# Patient Record
Sex: Male | Born: 1960
Health system: Southern US, Community
[De-identification: ages and names within clinical notes are randomized; demographics above are authoritative.]

## PROBLEM LIST (undated history)

## (undated) DIAGNOSIS — I10 Essential (primary) hypertension: Secondary | ICD-10-CM

## (undated) DIAGNOSIS — M419 Scoliosis, unspecified: Secondary | ICD-10-CM

## (undated) DIAGNOSIS — E291 Testicular hypofunction: Secondary | ICD-10-CM

## (undated) DIAGNOSIS — F524 Premature ejaculation: Secondary | ICD-10-CM

## (undated) DIAGNOSIS — F419 Anxiety disorder, unspecified: Secondary | ICD-10-CM

## (undated) DIAGNOSIS — Z860100 Personal history of colon polyps, unspecified: Secondary | ICD-10-CM

## (undated) DIAGNOSIS — M545 Low back pain, unspecified: Secondary | ICD-10-CM

## (undated) DIAGNOSIS — N529 Male erectile dysfunction, unspecified: Secondary | ICD-10-CM

## (undated) DIAGNOSIS — G4730 Sleep apnea, unspecified: Secondary | ICD-10-CM

## (undated) DIAGNOSIS — Z8719 Personal history of other diseases of the digestive system: Secondary | ICD-10-CM

## (undated) DIAGNOSIS — Z8711 Personal history of peptic ulcer disease: Secondary | ICD-10-CM

## (undated) DIAGNOSIS — Z973 Presence of spectacles and contact lenses: Secondary | ICD-10-CM

## (undated) DIAGNOSIS — R339 Retention of urine, unspecified: Secondary | ICD-10-CM

## (undated) DIAGNOSIS — Z9189 Other specified personal risk factors, not elsewhere classified: Secondary | ICD-10-CM

## (undated) DIAGNOSIS — F32A Depression, unspecified: Secondary | ICD-10-CM

## (undated) DIAGNOSIS — C61 Malignant neoplasm of prostate: Secondary | ICD-10-CM

## (undated) DIAGNOSIS — F329 Major depressive disorder, single episode, unspecified: Secondary | ICD-10-CM

## (undated) DIAGNOSIS — G8929 Other chronic pain: Secondary | ICD-10-CM

## (undated) HISTORY — DX: Retention of urine, unspecified: R33.9

## (undated) HISTORY — DX: Premature ejaculation: F52.4

## (undated) HISTORY — DX: Malignant neoplasm of prostate: C61

## (undated) HISTORY — DX: Major depressive disorder, single episode, unspecified: F32.9

## (undated) HISTORY — DX: Depression, unspecified: F32.A

## (undated) HISTORY — PX: APPENDECTOMY: SHX54

## (undated) HISTORY — PX: PROSTATE BIOPSY: SHX241

---

## 2010-11-30 HISTORY — PX: LUMBAR FUSION: SHX111

## 2011-08-24 ENCOUNTER — Emergency Department (INDEPENDENT_AMBULATORY_CARE_PROVIDER_SITE_OTHER)
Admission: EM | Admit: 2011-08-24 | Discharge: 2011-08-24 | Disposition: A | Discharge status: Home / Self Care | Payer: Self-pay | Source: Home / Self Care | Attending: Family Medicine | Admitting: Family Medicine

## 2011-08-24 ENCOUNTER — Encounter: Payer: Self-pay | Admitting: Cardiology

## 2011-08-24 DIAGNOSIS — J069 Acute upper respiratory infection, unspecified: Secondary | ICD-10-CM

## 2011-08-24 HISTORY — DX: Low back pain: M54.5

## 2011-08-24 HISTORY — DX: Other chronic pain: G89.29

## 2011-08-24 HISTORY — DX: Essential (primary) hypertension: I10

## 2011-08-24 HISTORY — DX: Low back pain, unspecified: M54.50

## 2011-08-24 MED ORDER — IPRATROPIUM BROMIDE 0.06 % NA SOLN: 2.0000 | Freq: Four times a day (QID) | NASAL | Status: DC

## 2011-08-24 MED ORDER — HYDROCOD POLST-CHLORPHEN POLST 10-8 MG/5ML PO LQCR: 5.0000 mL | Freq: Two times a day (BID) | ORAL | Status: DC

## 2011-08-24 NOTE — ED Notes (Signed)
Pt reports cough for the past 5 days getting worse since Friday. Reports back pain when he coughs. Pt reports chills. Denies fever.

## 2011-08-24 NOTE — ED Provider Notes (Signed)
History     CSN: [id number]  Arrival date & time 08/24/11  1118   First MD Initiated Contact with Patient 08/24/11 1206      Chief Complaint  Patient presents with  . Cough  . Back Pain    (Consider location/radiation/quality/duration/timing/severity/associated sxs/prior treatment) Patient is a 50 y.o. male presenting with URI. The history is provided by the patient.  URI The primary symptoms include cough. Primary symptoms do not include fever, nausea or vomiting. The current episode started 3 to 5 days ago. This is a new problem. The problem has not changed since onset. The onset of the illness is associated with exposure to sick contacts. Symptoms associated with the illness include congestion and rhinorrhea.    No past medical history on file.  No past surgical history on file.  No family history on file.  History  Substance Use Topics  . Smoking status: Not on file  . Smokeless tobacco: Not on file  . Alcohol Use: Not on file      Review of Systems  Constitutional: Negative for fever.  HENT: Positive for congestion and rhinorrhea.   Respiratory: Positive for cough.   Cardiovascular: Negative.   Gastrointestinal: Negative.  Negative for nausea and vomiting.  Genitourinary: Negative.   Musculoskeletal: Positive for back pain.    Allergies  Review of patient's allergies indicates not on file.  Home Medications   Current Outpatient Rx  Name Route Sig Dispense Refill  . HYDROCOD POLST-CHLORPHEN POLST 10-8 MG/5ML PO LQCR Oral Take 5 mLs by mouth every 12 (twelve) hours. 115 mL 0  . IPRATROPIUM BROMIDE 0.06 % NA SOLN Nasal Place 2 sprays into the nose 4 (four) times daily. 15 mL 12    BP 126/85  Pulse 102  Temp(Src) 99.4 F (37.4 C) (Oral)  Resp 16  SpO2 98%  Physical Exam  Nursing note and vitals reviewed. Constitutional: He appears well-developed and well-nourished.  HENT:  Head: Normocephalic.  Right Ear: External ear normal.  Left Ear:  External ear normal.  Mouth/Throat: Oropharynx is clear and moist.  Eyes: Pupils are equal, round, and reactive to light.  Neck: Normal range of motion. Neck supple.  Cardiovascular: Normal rate, regular rhythm, normal heart sounds and intact distal pulses.   Pulmonary/Chest: Effort normal and breath sounds normal.  Abdominal: Soft. Bowel sounds are normal.  Lymphadenopathy:    He has no cervical adenopathy.  Skin: Skin is warm and dry.    ED Course  Procedures (including critical care time)  Labs Reviewed - No data to display No results found.   1. URI (upper respiratory infection)       MDM          Barkley Bruns, MD 08/24/11 1339

## 2012-07-20 ENCOUNTER — Emergency Department (HOSPITAL_COMMUNITY)
Admission: EM | Admit: 2012-07-20 | Discharge: 2012-07-20 | Disposition: A | Discharge status: Home / Self Care | Payer: Self-pay | Attending: Emergency Medicine | Admitting: Emergency Medicine

## 2012-07-20 ENCOUNTER — Encounter (HOSPITAL_COMMUNITY): Payer: Self-pay | Admitting: *Deleted

## 2012-07-20 DIAGNOSIS — M545 Low back pain, unspecified: Secondary | ICD-10-CM | POA: Insufficient documentation

## 2012-07-20 DIAGNOSIS — G8929 Other chronic pain: Secondary | ICD-10-CM | POA: Insufficient documentation

## 2012-07-20 DIAGNOSIS — Z79899 Other long term (current) drug therapy: Secondary | ICD-10-CM | POA: Insufficient documentation

## 2012-07-20 DIAGNOSIS — I10 Essential (primary) hypertension: Secondary | ICD-10-CM | POA: Insufficient documentation

## 2012-07-20 DIAGNOSIS — F17200 Nicotine dependence, unspecified, uncomplicated: Secondary | ICD-10-CM | POA: Insufficient documentation

## 2012-07-20 LAB — POCT I-STAT, CHEM 8
Calcium, Ion: 1.18 mmol/L (ref 1.12–1.23)
Chloride: 103 mEq/L (ref 96–112)
HCT: 47 % (ref 39.0–52.0)
Potassium: 4.3 mEq/L (ref 3.5–5.1)
Sodium: 136 mEq/L (ref 135–145)

## 2012-07-20 LAB — POCT I-STAT TROPONIN I: Troponin i, poc: 0 ng/mL (ref 0.00–0.08)

## 2012-07-20 MED ORDER — ENALAPRIL-HYDROCHLOROTHIAZIDE 10-25 MG PO TABS: 1.0000 | ORAL_TABLET | Freq: Every day | ORAL | Status: DC

## 2012-07-20 NOTE — ED Provider Notes (Signed)
History     CSN: [id number]  Arrival date & time 07/20/12  4098   First MD Initiated Contact with Patient 07/20/12 [phone]      Chief Complaint  Patient presents with  . Hypertension    (Consider location/radiation/quality/duration/timing/severity/associated sxs/prior treatment) HPI Justin Dunn is a 51 y.o. male who presents with complaint of an elevated BP. Pt states he was at school with his son talking to a school nurse, when he was not feeling well. States he asked her measure his BP and it ws 200/100. Pt states at that time, he just felt tired. No specific complaints. Denies chest pain, SOB, swelling, headache. States has not taken his BP medications for about a week. States he misplaced it. States he is under a lot of stress, taking are of special needs son, and just separated with wife a month ago. Denies any complaints at present.   Past Medical History  Diagnosis Date  . Hypertension   . Chronic low back pain     Past Surgical History  Procedure Date  . Back surgery 12/16/2010    lower back surgery    History reviewed. No pertinent family history.  History  Substance Use Topics  . Smoking status: Current Some Day Smoker  . Smokeless tobacco: Not on file  . Alcohol Use: Yes     Comment: occas.      Review of Systems  Constitutional: Negative for fever, chills and unexpected weight change.  HENT: Negative for facial swelling, neck pain and neck stiffness.   Eyes: Negative for visual disturbance.  Respiratory: Negative.   Cardiovascular: Negative.   Gastrointestinal: Negative.   Musculoskeletal: Negative for myalgias and arthralgias.  Skin: Negative.   Neurological: Negative for dizziness, weakness, light-headedness and headaches.    Allergies  Review of patient's allergies indicates no known allergies.  Home Medications   Current Outpatient Rx  Name  Route  Sig  Dispense  Refill  . GABAPENTIN 600 MG PO TABS   Oral   Take 600 mg by mouth 3  (three) times daily.         . IBUPROFEN 800 MG PO TABS   Oral   Take 800 mg by mouth 3 (three) times daily with meals.         . TRAMADOL HCL 50 MG PO TABS   Oral   Take 50 mg by mouth every 4 (four) hours as needed. For pain         . ZOLPIDEM TARTRATE 10 MG PO TABS   Oral   Take 10 mg by mouth at bedtime.            BP 158/92  Pulse 78  Temp 98.3 F (36.8 C) (Oral)  Resp 18  SpO2 100%  Physical Exam  Nursing note and vitals reviewed. Constitutional: He is oriented to person, place, and time.  HENT:  Head: Normocephalic.  Eyes: Conjunctivae normal are normal.  Neck: Neck supple.  Pulmonary/Chest: Effort normal and breath sounds normal. No respiratory distress. He has no wheezes. He has no rales.  Abdominal: Soft. Bowel sounds are normal. He exhibits no distension. There is no tenderness. There is no rebound.  Musculoskeletal: Normal range of motion. He exhibits no edema.  Neurological: He is alert and oriented to person, place, and time.  Skin: Skin is warm and dry.  Psychiatric: He has a normal mood and affect.    ED Course  Procedures (including critical care time)   Date: 07/20/2012  Rate:  82  Rhythm: normal sinus rhythm  QRS Axis: normal  Intervals: normal  ST/T Wave abnormalities: normal  Conduction Disutrbances:none  Narrative Interpretation: early precordial RS transition  Old EKG Reviewed: none available  Results for orders placed during the hospital encounter of 07/20/12  POCT I-STAT TROPONIN I      Component Value Range   Troponin i, poc 0.00  0.00 - 0.08 ng/mL   Comment 3           POCT I-STAT, CHEM 8      Component Value Range   Sodium 136  135 - 145 mEq/L   Potassium 4.3  3.5 - 5.1 mEq/L   Chloride 103  96 - 112 mEq/L   BUN 16  6 - 23 mg/dL   Creatinine, Ser 9.62  0.50 - 1.35 mg/dL   Glucose, Bld 92  70 - 99 mg/dL   Calcium, Ion 9.52  8.41 - 1.23 mmol/L   TCO2 24  0 - 100 mmol/L   Hemoglobin 16.0  13.0 - 17.0 g/dL   HCT 32.4   40.1 - 02.7 %    Negative chem 8, negative troponin. Pt's BP has bee elevated here, but no signs of hypertensive urgency/emergency, no end organ damage. Pt requesting bp medication change, he states old meds reduce his urinary flow. Will try ACE/HCTZ combination.    1. Hypertension       MDM  Pt with recent stress, off of his BP medications for a week. VS normal. Will restart meds. Chem 8 normal here. BP elevated, no signs of end organ damage. Pt stable for d/c home with close follow up with pcp.   Filed Vitals:   07/20/12 0922 07/20/12 0931 07/20/12 1000 07/20/12 1200  BP: 183/121 158/92 151/92 157/106  Pulse: 82 78    Temp: 98.3 F (36.8 C)     TempSrc: Oral     Resp: 20 18 15    SpO2: 97% 100%            Lottie Mussel, PA 07/20/12 1634

## 2012-07-20 NOTE — ED Notes (Signed)
Patient denies pain and is resting comfortably.  

## 2012-07-20 NOTE — ED Notes (Addendum)
Patient complains of HTN yesterday despite BP medications.  Takes Lisinopril and clonidine for same.   Out of BP medications for 1 month.  Patient had recent back surgery.

## 2012-07-20 NOTE — ED Notes (Signed)
D/c home in NAD. Discussed importance of taking BP medications daily and need for follow up with PCP. Pt voiced understanding of d/c instructions and follow up care.

## 2012-07-23 NOTE — ED Provider Notes (Signed)
Medical screening examination/treatment/procedure(s) were performed by non-physician practitioner and as supervising physician I was immediately available for consultation/collaboration.  Derwood Kaplan, MD 07/23/12 0222

## 2014-01-09 ENCOUNTER — Encounter: Payer: Self-pay | Admitting: Radiation Oncology

## 2014-01-09 DIAGNOSIS — C61 Malignant neoplasm of prostate: Secondary | ICD-10-CM | POA: Insufficient documentation

## 2014-01-09 NOTE — Progress Notes (Signed)
Radiation Oncology         [phone]) [phone] ________________________________  Initial outpatient Consultation  Name: Justin Dunn. MRN: [id number]  Date: 01/11/2014  DOB: [birth year]  CW:[id number], Cora Collum, NP  [name], MD   REFERRING PHYSICIAN: [name], MD  DIAGNOSIS: 53 y.o. gentleman with stage T1c adenocarcinoma of the prostate with a Gleason's score of 3+3 and a PSA of 5.98  HISTORY OF PRESENT ILLNESS::Justin Dunn. is a 53 y.o. gentleman.  He was noted to have an elevated PSA of 5.62 in August 2014 by his primary care physician, Dr. Joseph Art.  Short interval follow-up PSA on 9/14 remained elevated at 5.98.  Accordingly, he was referred for evaluation in urology by Dr. Karsten Ro on 10/8,  digital rectal examination was performed at that time revealing 1+ gland with no nodules.  The patient proceeded to transrectal ultrasound with 12 biopsies of the prostate on 10/30.  The prostate volume measured 19.1 cc.  Out of 12 core biopsies,4 were positive.  The maximum Gleason score was 3+3, and this was seen on the left side as displayed belowL.  The patient reviewed the biopsy results with his urologist and he has kindly been referred today for discussion of potential radiation treatment options.  PREVIOUS RADIATION THERAPY: No  PAST MEDICAL HISTORY:  has a past medical history of Hypertension; Chronic low back pain; Anxiety; Hypogonadism male; Sleep apnea; Prostate cancer; Depression; and Premature ejaculation.    PAST SURGICAL HISTORY: Past Surgical History  Procedure Laterality Date  . Back surgery  12/16/2010    lower back surgery  . Prostate biopsy    . Low back surgery      FAMILY HISTORY: family history includes Cancer in his father.  SOCIAL HISTORY:  reports that he has been smoking Cigarettes.  He has been smoking about 0.25 packs per day. He does not have any smokeless tobacco history on file. He reports that he drinks alcohol. He reports that he does not  use illicit drugs.  ALLERGIES: Review of patient's allergies indicates no known allergies.  MEDICATIONS:  Current Outpatient Prescriptions  Medication Sig Dispense Refill  . cyclobenzaprine (FLEXERIL) 10 MG tablet Take 10 mg by mouth 3 (three) times daily as needed for muscle spasms.      . enalapril-hydrochlorothiazide (VASERETIC) 10-25 MG per tablet Take 1 tablet by mouth daily.  30 tablet  0  . gabapentin (NEURONTIN) 600 MG tablet Take 600 mg by mouth 3 (three) times daily.      Marland Kitchen ibuprofen (ADVIL,MOTRIN) 800 MG tablet Take 800 mg by mouth 3 (three) times daily with meals.      . sertraline (ZOLOFT) 50 MG tablet Take 50 mg by mouth daily.      . tadalafil (CIALIS) 20 MG tablet Take 20 mg by mouth daily as needed for erectile dysfunction.      . traZODone (DESYREL) 100 MG tablet Take 100 mg by mouth at bedtime.       No current facility-administered medications for this encounter.    REVIEW OF SYSTEMS:  A 15 point review of systems is documented in the electronic medical record. This was obtained by the nursing staff. However, I reviewed this with the patient to discuss relevant findings and make appropriate changes.  A comprehensive review of systems was negative..  The patient completed an IPSS and IIEF questionnaire.  His IPSS score was 14 indicating moderate urinary outflow obstructive symptoms.  He indicated that his erectile function is able  to complete sexual activity on most attempts.   PHYSICAL EXAM: This patient is in no acute distress.  He is alert and oriented.   height is 5\' 11"  (1.803 m) and weight is 211 lb 3.2 oz (95.8 kg).  He exhibits no respiratory distress or labored breathing.  He appears neurologically intact.  His mood is pleasant.  His affect is appropriate.  Please note the digital rectal exam findings described above.  KPS = 100  100 - Normal; no complaints; no evidence of disease. 90   - Able to carry on normal activity; minor signs or symptoms of disease. 80    - Normal activity with effort; some signs or symptoms of disease. 30   - Cares for self; unable to carry on normal activity or to do active work. 60   - Requires occasional assistance, but is able to care for most of his personal needs. 50   - Requires considerable assistance and frequent medical care. 62   - Disabled; requires special care and assistance. 51   - Severely disabled; hospital admission is indicated although death not imminent. 28   - Very sick; hospital admission necessary; active supportive treatment necessary. 10   - Moribund; fatal processes progressing rapidly. 0     - Dead  Karnofsky DA, Abelmann [place], Craver LS and Burchenal [facility] [phone]) The use of the nitrogen mustards in the palliative treatment of carcinoma: with particular reference to bronchogenic carcinoma Cancer 1 634-56   LABORATORY DATA:  Lab Results  Component Value Date   HGB 16.0 07/20/2012   HCT 47.0 07/20/2012   Lab Results  Component Value Date   NA 136 07/20/2012   K 4.3 07/20/2012   CL 103 07/20/2012   No results found for this basename: ALT,  AST,  GGT,  ALKPHOS,  BILITOT     RADIOGRAPHY: No results found.    IMPRESSION: This gentleman is a 53 y.o. gentleman with stage T1c adenocarcinoma of the prostate with a Gleason's score of 3+3 and a PSA of 5.98.  His T-Stage, Gleason's Score, and PSA put him into the favorable risk group.  Accordingly he is eligible for a variety of potential treatment options including active surveillance, prostatectomy, or radiotherapy with IMRT or seed implant.  PLAN:Today I reviewed the findings and workup thus far.  We discussed the natural history of prostate cancer.  We reviewed the the implications of T-stage, Gleason's Score, and PSA on decision-making and outcomes in prostate cancer.  We discussed radiation treatment in the management of prostate cancer with regard to the logistics and delivery of external beam radiation treatment as well as the logistics and delivery  of prostate brachytherapy.  We compared and contrasted each of these approaches and also compared these against prostatectomy.  The patient expressed interest in prostate brachytherapy.  I filled out a patient counseling form for him with relevant treatment diagrams and we retained a copy for our records.   The patient remains most interested in prostate brachytherapy.  I will share my findings with Dr. Karsten Ro.  The patient has our contact information once he makes his final decision.  If he elects to pursue seed implant, he will call Enid Derry to move forward with scheduling the procedure in the near future.     I enjoyed meeting with him today, and will look forward to participating in the care of this very nice gentleman.   I spent 60 minutes face to face with the patient and more than 50% of  that time was spent in counseling and/or coordination of care.   ------------------------------------------------  Sheral Apley. Tammi Klippel, M.D.

## 2014-01-10 ENCOUNTER — Encounter: Payer: Self-pay | Admitting: Radiation Oncology

## 2014-01-10 NOTE — Progress Notes (Signed)
GU Location of Tumor / Histology: adenocarcinoma of prostate  If Prostate Cancer, Gleason Score is (3 + 3) and PSA is (8.98)  Patient presented August 2014 with an elevated PSA of 5.62.  Biopsies of prostate (if applicable) revealed:     Past/Anticipated interventions by urology, if any: referral to radiation oncology  Past/Anticipated interventions by medical oncology, if any: None  Weight changes, if any: None noted  Bowel/Bladder complaints, if any: feelings of urinary urgency, difficulty starting the urine stream, weak urine stream, urinary stream starts and stops, erectile dysfunction and has to strain to initiate the flow of urine   Nausea/Vomiting, if any: None noted  Pain issues, if any:  None noted  SAFETY ISSUES:  Prior radiation? NO  Pacemaker/ICD?   Possible current pregnancy? N/A  Is the patient on methotrexate? 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  August 2014 PSA 5.62 September 2014 PSA 5.98 April 2015 PSA 8.98

## 2014-01-11 ENCOUNTER — Ambulatory Visit
Admission: RE | Admit: 2014-01-11 | Discharge: 2014-01-11 | Disposition: A | Discharge status: Home / Self Care | Payer: Medicare HMO | Source: Ambulatory Visit | Attending: Radiation Oncology | Admitting: Radiation Oncology

## 2014-01-11 ENCOUNTER — Encounter: Payer: Self-pay | Admitting: Radiation Oncology

## 2014-01-11 VITALS — Ht 71.0 in | Wt 211.2 lb

## 2014-01-11 DIAGNOSIS — C61 Malignant neoplasm of prostate: Secondary | ICD-10-CM

## 2014-01-11 HISTORY — DX: Testicular hypofunction: E29.1

## 2014-01-11 HISTORY — DX: Sleep apnea, unspecified: G47.30

## 2014-01-11 HISTORY — DX: Anxiety disorder, unspecified: F41.9

## 2014-01-11 NOTE — Progress Notes (Signed)
Patient presents today for consultation with Dr. Tammi Klippel reference prostate ca referral by Dr. Karsten Ro. Patient most interested in seed implant. IPSS 14 noted. Reports nocturia x 2. Reports urinary urgency. Reports difficulty starting urine stream, a weak urine stream, and intermittent stream (starts and stops). Patient reports erectile dysfunction and that he has to strain at time to initiate the flow of urine. Reports night sweats. Reports a weight loss of 15 lb x 1 month.  Denies bone pain. Denies headache, dizziness, nausea, vomiting or diarrhea. Denies dysuria or hematuria. Reports he stopped taking his testosterone injection November of 2014. Denies hx of radiation therapy. Denies having a pacemaker.

## 2014-01-11 NOTE — Progress Notes (Signed)
See progress note under physician encounter. 

## 2014-01-12 ENCOUNTER — Encounter: Payer: Self-pay | Admitting: *Deleted

## 2014-01-12 NOTE — Progress Notes (Signed)
[facility] Psychosocial Distress Screening Clinical Social Work  Clinical Social Work was referred by distress screening protocol.  The patient scored a 9 on the Psychosocial Distress Thermometer which indicates severe distress. Clinical Social Worker phoned pt to assess for distress and other psychosocial needs. Pt was not available, CSW left vm.   ONCBCN DISTRESS SCREENING 01/11/2014  Screening Type Initial Screening  Mark the number that describes how much distress you have been experiencing in the past week 9  Emotional problem type Depression;Adjusting to illness  Spiritual/Religous concerns type Facing my mortality  Information Concerns Type Lack of info about complementary therapy choices  Physical Problem type Pain  Physician notified of physical symptoms Yes  Referral to clinical psychology No  Referral to clinical social work Yes  Referral to dietition No  Referral to financial advocate No  Referral to support programs No  Referral to palliative care No    Clinical Social Worker follow up needed: yes  If yes, follow up plan: CSW awaits pt to return call and will attempt to follow up at later date.   Loren Racer, LCSW Clinical Social Worker [facility] [place] Wednesday, Thursday and Friday Phone: [phone] Fax: [phone]

## 2014-01-30 ENCOUNTER — Other Ambulatory Visit: Payer: Self-pay | Admitting: Urology

## 2014-01-31 ENCOUNTER — Ambulatory Visit
Admission: RE | Admit: 2014-01-31 | Discharge: 2014-01-31 | Disposition: A | Discharge status: Home / Self Care | Payer: Medicare HMO | Source: Ambulatory Visit | Attending: Radiation Oncology | Admitting: Radiation Oncology

## 2014-01-31 ENCOUNTER — Ambulatory Visit (HOSPITAL_COMMUNITY)
Admission: RE | Admit: 2014-01-31 | Discharge: 2014-01-31 | Disposition: A | Discharge status: Home / Self Care | Payer: Medicare HMO | Source: Ambulatory Visit | Attending: Urology | Admitting: Urology

## 2014-01-31 DIAGNOSIS — C61 Malignant neoplasm of prostate: Secondary | ICD-10-CM

## 2014-01-31 DIAGNOSIS — Z87891 Personal history of nicotine dependence: Secondary | ICD-10-CM | POA: Insufficient documentation

## 2014-01-31 DIAGNOSIS — I10 Essential (primary) hypertension: Secondary | ICD-10-CM | POA: Insufficient documentation

## 2014-01-31 NOTE — Progress Notes (Signed)
  Radiation Oncology         [phone]) [phone] ________________________________  Name: Maurie Boettcher. MRN: [id number]  Date: 01/31/2014  DOB: 1960-12-06  SIMULATION AND TREATMENT PLANNING NOTE PUBIC ARCH STUDY  UK:GURKYHC, Cora Collum, NP  [name], MD  DIAGNOSIS: 53 y.o. gentleman with stage T1c adenocarcinoma of the prostate with a Gleason's score of 3+3 and a PSA of 5.98  COMPLEX SIMULATION:  The patient presented today for evaluation for possible prostate seed implant. He was brought to the radiation planning suite and placed supine on the CT couch. A 3-dimensional image study set was obtained in upload to the planning computer. There, on each axial slice, I contoured the prostate gland. Then, using three-dimensional radiation planning tools I reconstructed the prostate in view of the structures from the transperineal needle pathway to assess for possible pubic arch interference. In doing so, I did not appreciate any pubic arch interference. Also, the patient's prostate volume was estimated based on the drawn structure. The volume was 26 cc.  Given the pubic arch appearance and prostate volume, patient remains a good candidate to proceed with prostate seed implant. Today, he freely provided informed written consent to proceed.    PLAN: The patient will undergo prostate seed implant.   ________________________________  Sheral Apley. Tammi Klippel, M.D.

## 2014-02-09 ENCOUNTER — Ambulatory Visit (HOSPITAL_BASED_OUTPATIENT_CLINIC_OR_DEPARTMENT_OTHER)
Admission: RE | Admit: 2014-02-09 | Discharge: 2014-02-09 | Disposition: A | Discharge status: Home / Self Care | Payer: Medicare HMO | Source: Ambulatory Visit | Attending: Urology | Admitting: Urology

## 2014-02-09 DIAGNOSIS — C61 Malignant neoplasm of prostate: Secondary | ICD-10-CM | POA: Insufficient documentation

## 2014-02-09 DIAGNOSIS — Z01810 Encounter for preprocedural cardiovascular examination: Secondary | ICD-10-CM | POA: Insufficient documentation

## 2014-02-28 HISTORY — PX: COLONOSCOPY W/ POLYPECTOMY: SHX1380

## 2014-03-01 ENCOUNTER — Other Ambulatory Visit (HOSPITAL_COMMUNITY): Payer: Medicare HMO

## 2014-03-30 DIAGNOSIS — I10 Essential (primary) hypertension: Secondary | ICD-10-CM | POA: Diagnosis not present

## 2014-03-30 DIAGNOSIS — Z8042 Family history of malignant neoplasm of prostate: Secondary | ICD-10-CM | POA: Diagnosis not present

## 2014-03-30 DIAGNOSIS — F17200 Nicotine dependence, unspecified, uncomplicated: Secondary | ICD-10-CM | POA: Diagnosis not present

## 2014-03-30 DIAGNOSIS — E291 Testicular hypofunction: Secondary | ICD-10-CM | POA: Diagnosis not present

## 2014-03-30 DIAGNOSIS — F341 Dysthymic disorder: Secondary | ICD-10-CM | POA: Diagnosis not present

## 2014-03-30 DIAGNOSIS — C61 Malignant neoplasm of prostate: Secondary | ICD-10-CM | POA: Diagnosis present

## 2014-03-30 LAB — CBC
HCT: 43.1 % (ref 39.0–52.0)
Hemoglobin: 14.8 g/dL (ref 13.0–17.0)
MCH: 31.2 pg (ref 26.0–34.0)
MCHC: 34.3 g/dL (ref 30.0–36.0)
MCV: 90.7 fL (ref 78.0–100.0)
Platelets: 257 10*3/uL (ref 150–400)
RBC: 4.75 MIL/uL (ref 4.22–5.81)
RDW: 14.7 % (ref 11.5–15.5)
WBC: 6.3 10*3/uL (ref 4.0–10.5)

## 2014-03-30 LAB — COMPREHENSIVE METABOLIC PANEL
ALT: 19 U/L (ref 0–53)
AST: 24 U/L (ref 0–37)
Albumin: 4.1 g/dL (ref 3.5–5.2)
Alkaline Phosphatase: 79 U/L (ref 39–117)
Anion gap: 11 (ref 5–15)
BUN: 17 mg/dL (ref 6–23)
CO2: 28 mEq/L (ref 19–32)
Calcium: 9.4 mg/dL (ref 8.4–10.5)
Chloride: 101 mEq/L (ref 96–112)
Creatinine, Ser: 1.1 mg/dL (ref 0.50–1.35)
GFR calc Af Amer: 86 mL/min — ABNORMAL LOW (ref >90)
GFR calc non Af Amer: 74 mL/min — ABNORMAL LOW (ref >90)
Glucose, Bld: 87 mg/dL (ref 70–99)
Potassium: 4.1 mEq/L (ref 3.7–5.3)
Sodium: 140 mEq/L (ref 137–147)
Total Bilirubin: 0.3 mg/dL (ref 0.3–1.2)
Total Protein: 7.4 g/dL (ref 6.0–8.3)

## 2014-03-30 LAB — APTT: aPTT: 31 seconds (ref 24–37)

## 2014-03-30 LAB — PROTIME-INR
INR: 0.97 (ref 0.00–1.49)
Prothrombin Time: 12.9 seconds (ref 11.6–15.2)

## 2014-04-02 ENCOUNTER — Encounter (HOSPITAL_BASED_OUTPATIENT_CLINIC_OR_DEPARTMENT_OTHER): Payer: Self-pay | Admitting: *Deleted

## 2014-04-02 NOTE — Progress Notes (Signed)
NPO AFTER MN WITH EXCEPTION CLEAR LIQUIDS UNTIL 0700.  ARRIVE AT 1130. CURRENT LAB RESULTS, EKG, AND CXR IN CHART AND EPIC. WILL TAKE GABAPENTIN AND ZOLOFT AM DOS W/ SIPS OF WATER AND DO FLEET ENEMA.

## 2014-04-02 NOTE — Progress Notes (Signed)
04/02/14 1043  OBSTRUCTIVE SLEEP APNEA  Have you ever been diagnosed with sleep apnea through a sleep study? No  Do you snore loudly (loud enough to be heard through closed doors)?  1  Do you often feel tired, fatigued, or sleepy during the daytime? 0  Has anyone observed you stop breathing during your sleep? 0  Do you have, or are you being treated for high blood pressure? 1  BMI more than 35 kg/m2? 0  Age over 55 years old? 1  Gender: 1  Obstructive Sleep Apnea Score 4  Score 4 or greater  Results sent to PCP

## 2014-04-05 ENCOUNTER — Telehealth: Payer: Self-pay | Admitting: *Deleted

## 2014-04-05 NOTE — Telephone Encounter (Signed)
CALLED PATIENT TO REMIND OF PROCEDURE FOR 04-06-14, SPOKE WITH PATIENT AND HE IS AWARE OF THIS PROCEDURE

## 2014-04-06 ENCOUNTER — Encounter (HOSPITAL_BASED_OUTPATIENT_CLINIC_OR_DEPARTMENT_OTHER): Payer: Medicare HMO | Admitting: Anesthesiology

## 2014-04-06 ENCOUNTER — Ambulatory Visit (HOSPITAL_BASED_OUTPATIENT_CLINIC_OR_DEPARTMENT_OTHER): Payer: Medicare HMO | Admitting: Anesthesiology

## 2014-04-06 ENCOUNTER — Ambulatory Visit (HOSPITAL_COMMUNITY): Payer: Medicare HMO

## 2014-04-06 ENCOUNTER — Ambulatory Visit (HOSPITAL_BASED_OUTPATIENT_CLINIC_OR_DEPARTMENT_OTHER)
Admission: RE | Admit: 2014-04-06 | Discharge: 2014-04-06 | Disposition: A | Discharge status: Home / Self Care | Payer: Medicare HMO | Source: Ambulatory Visit | Attending: Urology | Admitting: Urology

## 2014-04-06 ENCOUNTER — Encounter (HOSPITAL_BASED_OUTPATIENT_CLINIC_OR_DEPARTMENT_OTHER): Payer: Self-pay | Admitting: *Deleted

## 2014-04-06 ENCOUNTER — Encounter (HOSPITAL_BASED_OUTPATIENT_CLINIC_OR_DEPARTMENT_OTHER)
Admission: RE | Disposition: A | Discharge status: Home / Self Care | Payer: Self-pay | Source: Ambulatory Visit | Attending: Urology

## 2014-04-06 DIAGNOSIS — C61 Malignant neoplasm of prostate: Secondary | ICD-10-CM | POA: Insufficient documentation

## 2014-04-06 DIAGNOSIS — I10 Essential (primary) hypertension: Secondary | ICD-10-CM | POA: Diagnosis not present

## 2014-04-06 DIAGNOSIS — E291 Testicular hypofunction: Secondary | ICD-10-CM | POA: Diagnosis not present

## 2014-04-06 DIAGNOSIS — F341 Dysthymic disorder: Secondary | ICD-10-CM | POA: Diagnosis not present

## 2014-04-06 DIAGNOSIS — Z8042 Family history of malignant neoplasm of prostate: Secondary | ICD-10-CM | POA: Insufficient documentation

## 2014-04-06 DIAGNOSIS — F17200 Nicotine dependence, unspecified, uncomplicated: Secondary | ICD-10-CM | POA: Insufficient documentation

## 2014-04-06 HISTORY — DX: Personal history of colon polyps, unspecified: Z86.0100

## 2014-04-06 HISTORY — DX: Scoliosis, unspecified: M41.9

## 2014-04-06 HISTORY — PX: RADIOACTIVE SEED IMPLANT: SHX5150

## 2014-04-06 HISTORY — DX: Personal history of colonic polyps: Z86.010

## 2014-04-06 HISTORY — DX: Personal history of peptic ulcer disease: Z87.11

## 2014-04-06 HISTORY — DX: Male erectile dysfunction, unspecified: N52.9

## 2014-04-06 HISTORY — DX: Personal history of other diseases of the digestive system: Z87.19

## 2014-04-06 HISTORY — DX: Presence of spectacles and contact lenses: Z97.3

## 2014-04-06 HISTORY — DX: Other specified personal risk factors, not elsewhere classified: Z91.89

## 2014-04-06 SURGERY — INSERTION, RADIATION SOURCE, PROSTATE
Anesthesia: General | Site: Prostate

## 2014-04-06 MED ORDER — CIPROFLOXACIN IN D5W 400 MG/200ML IV SOLN
400.0000 mg | INTRAVENOUS | Status: AC
  Administered 2014-04-06: 400 mg via INTRAVENOUS
  Filled 2014-04-06: qty 200

## 2014-04-06 MED ORDER — IOHEXOL 350 MG/ML SOLN
INTRAVENOUS | Status: DC | PRN
  Administered 2014-04-06: 7 mL via INTRAVENOUS

## 2014-04-06 MED ORDER — PROPOFOL 10 MG/ML IV BOLUS
INTRAVENOUS | Status: DC | PRN
  Administered 2014-04-06 (×2): 200 mg via INTRAVENOUS
  Administered 2014-04-06: 100 mg via INTRAVENOUS

## 2014-04-06 MED ORDER — EPHEDRINE SULFATE 50 MG/ML IJ SOLN
INTRAMUSCULAR | Status: DC | PRN
  Administered 2014-04-06 (×2): 10 mg via INTRAVENOUS

## 2014-04-06 MED ORDER — MIDAZOLAM HCL 2 MG/2ML IJ SOLN
INTRAMUSCULAR | Status: AC
  Filled 2014-04-06: qty 2

## 2014-04-06 MED ORDER — LACTATED RINGERS IV SOLN
INTRAVENOUS | Status: DC
  Filled 2014-04-06: qty 1000

## 2014-04-06 MED ORDER — MIDAZOLAM HCL 5 MG/5ML IJ SOLN
INTRAMUSCULAR | Status: DC | PRN
  Administered 2014-04-06: 2 mg via INTRAVENOUS

## 2014-04-06 MED ORDER — CIPROFLOXACIN HCL 500 MG PO TABS: 500.0000 mg | ORAL_TABLET | Freq: Two times a day (BID) | ORAL | Status: DC

## 2014-04-06 MED ORDER — KETOROLAC TROMETHAMINE 30 MG/ML IJ SOLN
INTRAMUSCULAR | Status: DC | PRN
  Administered 2014-04-06: 30 mg via INTRAVENOUS

## 2014-04-06 MED ORDER — DEXAMETHASONE SODIUM PHOSPHATE 4 MG/ML IJ SOLN
INTRAMUSCULAR | Status: DC | PRN
  Administered 2014-04-06: 10 mg via INTRAVENOUS

## 2014-04-06 MED ORDER — LACTATED RINGERS IV SOLN
INTRAVENOUS | Status: DC
  Administered 2014-04-06 (×2): via INTRAVENOUS
  Filled 2014-04-06: qty 1000

## 2014-04-06 MED ORDER — HYDROCODONE-ACETAMINOPHEN 5-325 MG PO TABS
ORAL_TABLET | ORAL | Status: AC
  Filled 2014-04-06: qty 1

## 2014-04-06 MED ORDER — LIDOCAINE HCL (CARDIAC) 20 MG/ML IV SOLN
INTRAVENOUS | Status: DC | PRN
  Administered 2014-04-06: 100 mg via INTRAVENOUS

## 2014-04-06 MED ORDER — HYDROCODONE-ACETAMINOPHEN 5-325 MG PO TABS
1.0000 | ORAL_TABLET | Freq: Four times a day (QID) | ORAL | Status: DC | PRN
  Administered 2014-04-06: 1 via ORAL
  Filled 2014-04-06: qty 1

## 2014-04-06 MED ORDER — GLYCOPYRROLATE 0.2 MG/ML IJ SOLN
INTRAMUSCULAR | Status: DC | PRN
  Administered 2014-04-06: 0.2 mg via INTRAVENOUS

## 2014-04-06 MED ORDER — FLEET ENEMA 7-19 GM/118ML RE ENEM
1.0000 | ENEMA | Freq: Once | RECTAL | Status: DC
  Filled 2014-04-06: qty 1

## 2014-04-06 MED ORDER — ACETAMINOPHEN 10 MG/ML IV SOLN
INTRAVENOUS | Status: DC | PRN
  Administered 2014-04-06: 1000 mg via INTRAVENOUS

## 2014-04-06 MED ORDER — HYDROCODONE-ACETAMINOPHEN 10-325 MG PO TABS: 1.0000 | ORAL_TABLET | ORAL | Status: DC | PRN

## 2014-04-06 MED ORDER — STERILE WATER FOR IRRIGATION IR SOLN
Status: DC | PRN
  Administered 2014-04-06: 3 mL
  Administered 2014-04-06: 3000 mL

## 2014-04-06 MED ORDER — FENTANYL CITRATE 0.05 MG/ML IJ SOLN
INTRAMUSCULAR | Status: DC | PRN
  Administered 2014-04-06: 100 ug via INTRAVENOUS
  Administered 2014-04-06 (×2): 50 ug via INTRAVENOUS

## 2014-04-06 MED ORDER — FENTANYL CITRATE 0.05 MG/ML IJ SOLN
25.0000 ug | INTRAMUSCULAR | Status: DC | PRN
  Filled 2014-04-06: qty 1

## 2014-04-06 MED ORDER — ONDANSETRON HCL 4 MG/2ML IJ SOLN
INTRAMUSCULAR | Status: DC | PRN
  Administered 2014-04-06: 4 mg via INTRAVENOUS

## 2014-04-06 MED ORDER — SUCCINYLCHOLINE CHLORIDE 20 MG/ML IJ SOLN
INTRAMUSCULAR | Status: DC | PRN
  Administered 2014-04-06: 150 mg via INTRAVENOUS

## 2014-04-06 MED ORDER — FENTANYL CITRATE 0.05 MG/ML IJ SOLN
INTRAMUSCULAR | Status: AC
  Filled 2014-04-06: qty 4

## 2014-04-06 SURGICAL SUPPLY — 25 items
BAG URINE DRAINAGE (UROLOGICAL SUPPLIES) ×2 IMPLANT
BLADE CLIPPER SURG (BLADE) ×2 IMPLANT
CATH FOLEY 2WAY SLVR  5CC 16FR (CATHETERS) ×2
CATH FOLEY 2WAY SLVR 5CC 16FR (CATHETERS) ×2 IMPLANT
CATH ROBINSON RED A/P 20FR (CATHETERS) ×2 IMPLANT
CLOTH BEACON ORANGE TIMEOUT ST (SAFETY) ×2 IMPLANT
COVER MAYO STAND STRL (DRAPES) ×2 IMPLANT
COVER TABLE BACK 60X90 (DRAPES) ×2 IMPLANT
DRSG TEGADERM 4X4.75 (GAUZE/BANDAGES/DRESSINGS) ×2 IMPLANT
DRSG TEGADERM 8X12 (GAUZE/BANDAGES/DRESSINGS) ×2 IMPLANT
GLOVE BIO SURGEON STRL SZ8 (GLOVE) ×4 IMPLANT
GLOVE BIOGEL PI IND STRL 7.5 (GLOVE) IMPLANT
GLOVE BIOGEL PI INDICATOR 7.5 (GLOVE) ×1
GLOVE ECLIPSE 8.0 STRL XLNG CF (GLOVE) ×4 IMPLANT
GLOVE INDICATOR 7.5 STRL GRN (GLOVE) ×1 IMPLANT
GOWN STRL REUS W/ TWL XL LVL3 (GOWN DISPOSABLE) IMPLANT
GOWN STRL REUS W/TWL XL LVL3 (GOWN DISPOSABLE) ×4
HOLDER FOLEY CATH W/STRAP (MISCELLANEOUS) ×2 IMPLANT
PACK CYSTOSCOPY (CUSTOM PROCEDURE TRAY) ×2 IMPLANT
SPONGE GAUZE 4X4 12PLY STER LF (GAUZE/BANDAGES/DRESSINGS) ×1 IMPLANT
SYRINGE 10CC LL (SYRINGE) ×2 IMPLANT
UNDERPAD 30X30 INCONTINENT (UNDERPADS AND DIAPERS) ×4 IMPLANT
WATER STERILE IRR 3000ML UROMA (IV SOLUTION) ×1 IMPLANT
WATER STERILE IRR 500ML POUR (IV SOLUTION) ×2 IMPLANT
radioactive seeds ×99 IMPLANT

## 2014-04-06 NOTE — Op Note (Signed)
PATIENT:  Justin Dunn.  PRE-OPERATIVE DIAGNOSIS:  Adenocarcinoma of the prostate  POST-OPERATIVE DIAGNOSIS:  Same  PROCEDURE:  Procedure(s): 1. I-125 radioactive seed implantation 2. Cystoscopy  SURGEON:  Surgeon(s): [name]  Radiation oncologist: Dr. Tyler Pita  ANESTHESIA:  General  EBL:  Minimal  DRAINS: 37 French Foley catheter  INDICATION: Justin Dunn. is a 53 year old male who was found to have an elevated PSA of 5.98 but no abnormality on DRE. His biopsy was positive for Gleason 3+3 = 6 in 4 cores from the left lobe. We discussed the treatment options and he has elected to proceed with radioactive seed implantation.  Description of procedure: After informed consent the patient was brought to the major OR, placed on the table and administered general anesthesia. He was then moved to the modified lithotomy position with his perineum perpendicular to the floor. His perineum and genitalia were then sterilely prepped. An official timeout was then performed. A 16 French Foley catheter was then placed in the bladder and filled with dilute contrast, a rectal tube was placed in the rectum and the transrectal ultrasound probe was placed in the rectum and affixed to the stand. He was then sterilely draped.  Real time ultrasonography was used along with the seed planning software spot-pro version 3.1-00. This was used to develop the seed plan including the number of needles as well as number of seeds required for complete and adequate coverage. Real-time ultrasonography was then used along with the previously developed plan and the Nucletron device to implant a total of 99 seeds using 26 needles. This proceeded without difficulty or complication.  A Foley catheter was then removed as well as the transrectal ultrasound probe and rectal probe. Flexible cystoscopy was then performed using the 17 French flexible scope which revealed a normal urethra throughout its length  down to the sphincter which appeared intact. The prostatic urethra revealed bilobar hypertrophy but no evidence of obstruction, seeds, spacers or lesions. The bladder was then entered and fully and systematically inspected. The ureteral orifices were noted to be of normal configuration and position. The mucosa revealed no evidence of tumors. There were also no stones identified within the bladder. I noted no seeds or spacers on the floor of the bladder and retroflexion of the scope revealed no seeds protruding from the base of the prostate.  The cystoscope was then removed and a new 3 French Foley catheter was then inserted and the balloon was filled with 10 cc of sterile water. This was connected to closed system drainage and the patient was awakened and taken to recovery room in stable and satisfactory condition. He tolerated procedure well and there were no intraoperative complications.

## 2014-04-06 NOTE — Anesthesia Preprocedure Evaluation (Addendum)
Anesthesia Evaluation  Patient identified by MRN, date of birth, ID band Patient awake    Reviewed: Allergy & Precautions, H&P , NPO status , Patient's Chart, lab work & pertinent test results  Airway Mallampati: III TM Distance: >3 FB Neck ROM: full    Dental no notable dental hx. (+) Teeth Intact, Dental Advisory Given   Pulmonary Current Smoker,  Stop bang 4 breath sounds clear to auscultation  Pulmonary exam normal       Cardiovascular Exercise Tolerance: Good hypertension, Pt. on medications Rhythm:regular Rate:Normal     Neuro/Psych PSYCHIATRIC DISORDERS Anxiety Depression negative neurological ROS     GI/Hepatic negative GI ROS, Neg liver ROS,   Endo/Other  negative endocrine ROS  Renal/GU negative Renal ROS     Musculoskeletal   Abdominal   Peds  Hematology negative hematology ROS (+)   Anesthesia Other Findings   Reproductive/Obstetrics negative OB ROS                         Anesthesia Physical Anesthesia Plan  ASA: II  Anesthesia Plan: General   Post-op Pain Management:    Induction: Intravenous  Airway Management Planned: LMA  Additional Equipment:   Intra-op Plan:   Post-operative Plan: Extubation in OR  Informed Consent: I have reviewed the patients History and Physical, chart, labs and discussed the procedure including the risks, benefits and alternatives for the proposed anesthesia with the patient or authorized representative who has indicated his/her understanding and acceptance.   Dental Advisory Given  Plan Discussed with: CRNA  Anesthesia Plan Comments:        Anesthesia Quick Evaluation

## 2014-04-06 NOTE — Transfer of Care (Signed)
Immediate Anesthesia Transfer of Care Note  Patient: Justin Dunn.  Procedure(s) Performed: Procedure(s) with comments: RADIOACTIVE SEED IMPLANT (N/A) - dr portable   Patient Location: PACU  Anesthesia Type:General  Level of Consciousness: sedated and responds to stimulation  Airway & Oxygen Therapy: Patient Spontanous Breathing and Patient connected to face mask oxygen  Post-op Assessment: Report given to PACU RN  Post vital signs: Reviewed and stable  Complications: No apparent anesthesia complications

## 2014-04-06 NOTE — Anesthesia Procedure Notes (Signed)
Procedure Name: Intubation Date/Time: 04/06/2014 1:40 PM Performed by: Bethena Roys T Pre-anesthesia Checklist: Patient identified, Emergency Drugs available, Suction available and Patient being monitored Patient Re-evaluated:Patient Re-evaluated prior to inductionOxygen Delivery Method: Circle System Utilized Preoxygenation: Pre-oxygenation with 100% oxygen Intubation Type: IV induction Ventilation: Mask ventilation without difficulty Laryngoscope Size: Mac and 3 Tube type: Oral Tube size: 8.0 mm Number of attempts: 2 Airway Equipment and Method: stylet and oral airway Placement Confirmation: ETT inserted through vocal cords under direct vision,  positive ETCO2 and breath sounds checked- equal and bilateral Secured at: 22 cm Tube secured with: Tape Dental Injury: Teeth and Oropharynx as per pre-operative assessment  Comments: LMA initially placed with poor return.  Repositioned without improving.  Additional propofol given LMA removed and suctioned copious clear secretions.  O2 sat 88%.  OPA in, Additional Propofol and sux given,  Intubated on 2nd attempt with Grade 3 view.   Sat improved with ventilation prior to intubation. No dental damage.  Probable laryngospasm from secretions on first LMA placement

## 2014-04-06 NOTE — H&P (Signed)
Justin Dunn is a 53 year old male with a history of prostate cancer.   History of Present Illness Hypogonadism: He remains on testosterone replacement therapy using injections and every 2 weeks.    PSA was found to be elevated: 8/14 - 5.62 & 9/14 - 5.98 No abnormality on DRE. His father had prostate cancer diagnosed in his late 61s.  TRUS/BX 06/29/13: Prostate volume - 19 cc  Pathology: Adenocarcinoma Gleason score 3+3 = 6 in 4 cores all from the left lobe. Stage T1c    Interval history: When I saw him last in 11/14 we had discussed surgery versus radiation for treatment of his prostate cancer and he indicated he was going to give it consideration and contact me how he would like to proceed. He wanted to discuss this a little further today. He also indicated that he had a PSA done a week or 2 ago that was 8.98.   Past Medical History Problems  1. History of Anxiety (300.00) 2. History of depression (V11.8) 3. History of hypertension (V12.59) 4. History of sleep apnea (V13.89) 5. History of Hypogonadism, testicular (257.2) 6. History of Premature ejaculation (302.75)  Surgical History Problems  1. History of Biopsy Of The Prostate Needle 2. History of Lower Back Surgery  Current Meds 1. AmLODIPine Besylate 5 MG Oral Tablet;  Therapy: (Recorded:08Oct2014) to Recorded 2. Cialis 20 MG Oral Tablet; Take as directed;  Therapy: 20Aug2009 to (Evaluate:25Sep2009); Last Rx:20Aug2009 Ordered 3. Cyclobenzaprine HCl - 10 MG Oral Tablet;  Therapy: (Recorded:08Oct2014) to Recorded 4. Depo-Testosterone SOLN;  Therapy: (Recorded:08Oct2014) to Recorded 5. Enalapril-Hydrochlorothiazide 10-25 MG Oral Tablet;  Therapy: 17Apr2015 to Recorded 6. Gabapentin 300 MG Oral Capsule;  Therapy: (Recorded:08Oct2014) to Recorded 7. Ibuprofen 600 MG Oral Tablet;  Therapy: (Recorded:08Oct2014) to Recorded 8. Meloxicam 7.5 MG Oral Tablet;  Therapy: (Recorded:08Oct2014) to Recorded 9. Sertraline HCl - 50  MG Oral Tablet;  Therapy: (Recorded:08Oct2014) to Recorded 10. TraZODone HCl - 100 MG Oral Tablet;   Therapy: [id number] to Recorded  Allergies Medication  1. No Known Drug Allergies  Family History Problems  1. Family history of Carotid Sinus Pacemaker 2. Family history of Father Deceased At Age ____   Died from prostate cancer at 73 3. Family history of Hypertension (V17.49) 4. Family history of Prostate Cancer (E36.62) : Father 5. Family history of Prostate Cancer (H47.65) : Father  Social History Problems  1. Alcohol Use   occassional 2. Caffeine Use   1-2 cups a day 3. Former Smoker   2-3 cigars a day 4. Marital History - Currently Married 5. Occupation:   Animal nutritionist 6. Tobacco Use (V15.82)   social smoker   Vitals Vital Signs  Height: 5 ft 11 in Weight: 211 lb  BMI Calculated: 29.43 BSA Calculated: 2.16 Recorded: [id number] 10:33AM  Height: 5 ft 11 in Weight: 212 lb  BMI Calculated: 29.57 BSA Calculated: 2.16 Blood Pressure: 125 / 80 Heart Rate: 84  Review of Systems Genitourinary, constitutional, skin, eye, otolaryngeal, hematologic/lymphatic, cardiovascular, pulmonary, endocrine, musculoskeletal, gastrointestinal, neurological and psychiatric system(s) were reviewed and pertinent findings if present are noted.  Genitourinary: feelings of urinary urgency, dysuria, difficulty starting the urinary stream, weak urinary stream, urinary stream starts and stops, erectile dysfunction and initiating urination requires straining.  Constitutional: feeling tired (fatigue).  Eyes: blurred vision.  Musculoskeletal: back pain.  Psychiatric: anxiety and depression.   Physical Exam Constitutional: Well nourished and well developed . No acute distress.  ENT:. The ears and nose are normal in appearance.  Neck: The appearance  of the neck is normal and no neck mass is present.  Pulmonary: No respiratory distress and normal respiratory rhythm and effort.   Cardiovascular: Heart rate and rhythm are normal . No peripheral edema.  Abdomen: The abdomen is soft and nontender. No masses are palpated. No CVA tenderness. No hernias are palpable. No hepatosplenomegaly noted.  Rectal: Rectal exam demonstrates normal sphincter tone, no tenderness and no masses. Estimated prostate size is 1+. The prostate has no nodularity and is not tender. The left seminal vesicle is nonpalpable. The right seminal vesicle is nonpalpable. The perineum is normal on inspection.  Genitourinary: Examination of the penis demonstrates no discharge, no masses, no lesions and a normal meatus. The penis is uncircumcised. The scrotum is without lesions. The right epididymis is palpably normal and non-tender. The left epididymis is palpably normal and non-tender. The right testis is atrophic, but non-tender and without masses. The left testis is non-tender and without masses.  Lymphatics: The femoral and inguinal nodes are not enlarged or tender.  Skin: Normal skin turgor, no visible rash and no visible skin lesions.  Neuro/Psych:. Mood and affect are appropriate.    Assessment  I noted no change on his DRE. We did discuss the fact that his PSA had risen slightly.  I therefore discussed radiation versus surgery with him again today in detail. He had many questions about both of these forms of therapy which I have answered to his satisfaction today. He did not want to proceed with surgical management and therefore we discussed radioactive seed implantation as a good option. He would like to proceed with that.  Plan He is scheduled for I-125 radioactive seed implantation.

## 2014-04-06 NOTE — Anesthesia Postprocedure Evaluation (Signed)
  Anesthesia Post-op Note  Patient: Justin Dunn.  Procedure(s) Performed: Procedure(s) (LRB): RADIOACTIVE SEED IMPLANT (N/A)  Patient Location: PACU  Anesthesia Type: General  Level of Consciousness: awake and alert   Airway and Oxygen Therapy: Patient Spontanous Breathing  Post-op Pain: mild  Post-op Assessment: Post-op Vital signs reviewed, Patient's Cardiovascular Status Stable, Respiratory Function Stable, Patent Airway and No signs of Nausea or vomiting  Last Vitals:  Filed Vitals:   04/06/14 1615  BP: 118/82  Pulse: 81  Temp:   Resp: 15    Post-op Vital Signs: stable   Complications: No apparent anesthesia complications

## 2014-04-06 NOTE — Discharge Instructions (Signed)
DISCHARGE INSTRUCTIONS FOR PROSTATE SEED IMPLANTATION ° °Removal of catheter °Remove the foley catheter after 24 hours ( day after the procedure).can be done easily by cutting the side port of the catheter, whichallow the balloon to deflate.  You will see 1-2 teaspoons of clear water as the balloon deflates and then the catheter can be slid out without difficulty. ° ° °     Cut here ° °Antibiotics °You may be given a prescription for an antibiotic to take when you arrive home. If so, be sure to take every tablet in the bottle, even if you are feeling better before the prescription is finished. If you begin itching, notice a rash or start to swell on your trunk, arms, legs and/or throat, immediately stop taking the antibiotic and call your Urologist. °Diet °Resume your usual diet when you return home. To keep your bowels moving easily and softly, drink prune, apple and cranberry juice at room temperature. You may also take a stool softener, such as Colace, which is available without prescription at local pharmacies. °Daily activities °  No driving or heavy lifting for at least two days after the implant. °  No bike riding, horseback riding or riding lawn mowers for the first month after the implant. °  Any strenuous physical activity should be approved by your doctor before you resume it. °Sexual relations °You may resume sexual relations two weeks after the procedure. A condom should be used for the first two weeks. Your semen may be dark brown or black; this is normal and is related bleeding that may have occurred during the implant. °Postoperative swelling °Expect swelling and bruising of the scrotum and perineum (the area between the scrotum and anus). Both the swelling and the bruising should resolve in l or 2 weeks. Ice packs and over- the-counter medications such as Tylenol, Advil or Aleve may lessen your discomfort. °Postoperative urination °Most men experience burning on urination and/or urinary frequency.  If this becomes bothersome, contact your Urologist.  Medication can be prescribed to relieve these problems.  It is normal to have some blood in your urine for a few days after the implant. °Special instructions related to the seeds °It is unlikely that you will pass an Iodine-125 seed in your urine. The seeds are silver in color and are about as large as a grain of rice. If you pass a seed, do not handle it with your fingers. Use a spoon to place it in an envelope or jar in place this in base occluded area such as the garage or basement for return to the radiation clinic at your convenience. ° °Contact your doctor for °  Temperature greater than 101 F °  Increasing pain °  Inability to urinate °Follow-up ° You should have follow up with your urologist and radiation oncologist about 3 weeks after the procedure. °General information regarding Iodine seeds °  Iodine-125 is a low energy radioactive material. It is not deeply penetrating and loses energy at short distances. Your prostate will absorb the radiation. Objects that are touched or used by the patient do not become radioactive. °  Body wastes (urine and stool) or body fluids (saliva, tears, semen or blood) are not radioactive. °  The Nuclear Regulatory Commission (NRC) has determined that no radiation precautions are needed for patients undergoing Iodine-125 seed implantation. The NRC states that such patients do not present a risk to the people around them, including young children and pregnant women. However, in keeping with the general principle   that radiation exposure should be kept as low reasonably possible, we suggest the following: °  Children and pets should not sit on the patient's lap for the first two (2) weeks after the implant. °  Pregnant (or possibly pregnant) women should avoid prolonged, close contact with the patient for the first two (2) weeks after the implant. °  A distance of three (3) feet is acceptable. °At a distance of three (3)  feet, there is no limit to the length of time anyone can be with the patient. °Post Anesthesia Home Care Instructions ° °Activity: °Get plenty of rest for the remainder of the day. A responsible adult should stay with you for 24 hours following the procedure.  °For the next 24 hours, DO NOT: °-Drive a car °-Operate machinery °-Drink alcoholic beverages °-Take any medication unless instructed by your physician °-Make any legal decisions or sign important papers. ° °Meals: °Start with liquid foods such as gelatin or soup. Progress to regular foods as tolerated. Avoid greasy, spicy, heavy foods. If nausea and/or vomiting occur, drink only clear liquids until the nausea and/or vomiting subsides. Call your physician if vomiting continues. ° °Special Instructions/Symptoms: °Your throat may feel dry or sore from the anesthesia or the breathing tube placed in your throat during surgery. If this causes discomfort, gargle with warm salt water. The discomfort should disappear within 24 hours. °   °

## 2014-04-06 NOTE — Procedures (Signed)
  Radiation Oncology         [phone]) [phone] ________________________________  Name: Justin Dunn. MRN: [id number]  Date: 04/06/2014  DOB: April 14, 1961       Prostate Seed Implant  [id number], Cora Collum, NP  No ref. provider found  DIAGNOSIS: 53 y.o. gentleman with stage T1c adenocarcinoma of the prostate with a Gleason's score of 3+3 and a PSA of 5.98  PROCEDURE: Insertion of radioactive I-125 seeds into the prostate gland.  RADIATION DOSE: 145 Gy, definitive therapy.  TECHNIQUE: Chetan Mehring. was brought to the operating room with the urologist. He was placed in the dorsolithotomy position. He was catheterized and a rectal tube was inserted. The perineum was shaved, prepped and draped. The ultrasound probe was then introduced into the rectum to see the prostate gland.  TREATMENT DEVICE: A needle grid was attached to the ultrasound probe stand and anchor needles were placed.  3D PLANNING: The prostate was imaged in 3D using a sagittal sweep of the prostate probe. These images were transferred to the planning computer. There, the prostate, urethra and rectum were defined on each axial reconstructed image. Then, the software created an optimized 3D plan and a few seed positions were adjusted. The quality of the plan was reviewed using [facility] information for the target and the following two organs at risk:  Urethra and Rectum.  Then the accepted plan was uploaded to the seed Selectron afterloading unit.  PROSTATE VOLUME STUDY:  Using transrectal ultrasound the volume of the prostate was verified to be 43.3 cc.  SPECIAL TREATMENT PROCEDURE/SUPERVISION AND HANDLING: The Nucletron FIRST system was used to place the needles under sagittal guidance. A total of 26 needles were used to deposit 99 seeds in the prostate gland. The individual seed activity was 0.334 mCi for a total implant activity of 33.066 mCi.  COMPLEX SIMULATION: At the end of the procedure, an anterior radiograph of the pelvis was  obtained to document seed positioning and count. Cystoscopy was performed to check the urethra and bladder.  MICRODOSIMETRY: At the end of the procedure, the patient was emitting 0.06 mrem/hr at 1 meter. Accordingly, he was considered safe for hospital discharge.  PLAN: The patient will return to the radiation oncology clinic for post implant CT dosimetry in three weeks.   ________________________________  Sheral Apley Tammi Klippel, M.D.

## 2014-04-09 ENCOUNTER — Encounter (HOSPITAL_BASED_OUTPATIENT_CLINIC_OR_DEPARTMENT_OTHER): Payer: Self-pay | Admitting: Urology

## 2014-04-26 ENCOUNTER — Telehealth: Payer: Self-pay | Admitting: *Deleted

## 2014-04-26 NOTE — Telephone Encounter (Signed)
CALLED PATIENT TO REMIND OF APPTS. FOR 04-27-14, SPOKE WITH PATIENT AND HE IS AWARE OF THESE APPTS.

## 2014-04-27 ENCOUNTER — Encounter: Payer: Self-pay | Admitting: Radiation Oncology

## 2014-04-27 ENCOUNTER — Ambulatory Visit
Admit: 2014-04-27 | Discharge: 2014-04-27 | Disposition: A | Discharge status: Home / Self Care | Payer: Medicare HMO | Attending: Radiation Oncology | Admitting: Radiation Oncology

## 2014-04-27 ENCOUNTER — Ambulatory Visit
Admission: RE | Admit: 2014-04-27 | Discharge: 2014-04-27 | Disposition: A | Discharge status: Home / Self Care | Payer: Medicare HMO | Source: Ambulatory Visit | Attending: Radiation Oncology | Admitting: Radiation Oncology

## 2014-04-27 VITALS — BP 124/79 | HR 70 | Resp 16 | Wt 212.5 lb

## 2014-04-27 DIAGNOSIS — C61 Malignant neoplasm of prostate: Secondary | ICD-10-CM | POA: Diagnosis present

## 2014-04-27 NOTE — Progress Notes (Signed)
Taking flomax two tablets at bedtime and two tablets each morning. Reports difficulty emptying his bladder. States, "I have been checked for a bladder infection but, there is not one." Reports sharp pain in bladder when he has to sit and strain to void. Reports his urine often starts off as a dribble the, stops and starts. Denies dysuria or hematuria. Denis diarrhea, blood in stool or pain associated with bowel movements. Reports urgency.

## 2014-04-27 NOTE — Progress Notes (Signed)
  Radiation Oncology         [phone]) [phone] ________________________________  Name: Justin Dunn. MRN: [id number]  Date: 04/27/2014  DOB: 1961-07-26  COMPLEX SIMULATION NOTE  NARRATIVE:  The patient was brought to the [facility] today following prostate seed implantation approximately one month ago.  Identity was confirmed.  All relevant records and images related to the planned course of therapy were reviewed.  Then, the patient was set-up supine.  CT images were obtained.  The CT images were loaded into the planning software.  Then the prostate and rectum were contoured.  Treatment planning then occurred.  The implanted iodine 125 seeds were identified by the physics staff for projection of radiation distribution  I have requested : 3D Simulation  I have requested a DVH of the following structures: Prostate and rectum.    ________________________________  Sheral Apley Tammi Klippel, M.D.

## 2014-04-27 NOTE — Progress Notes (Signed)
Radiation Oncology         [phone]) [phone] ________________________________  Name: Justin Dunn. MRN: [id number]  Date: 04/27/2014  DOB: 03/31/1961  Follow-Up Visit Note  CC: Vincenza Hews, NP  [name], MD  Diagnosis:   53 y.o. gentleman with stage T1c adenocarcinoma of the prostate with a Gleason's score of 3+3 and a PSA of 5.98  Interval Since Last Radiation:  3  weeks  Narrative:  The patient returns today for routine follow-up.  He is complaining of increased urinary frequency and urinary hesitation symptoms. He filled out a questionnaire regarding urinary function today providing and overall IPSS score of 30 characterizing his symptoms as severe.  His pre-implant score was 14. He denies any bowel symptoms.  ALLERGIES:  has No Known Allergies.  Meds: Current Outpatient Prescriptions  Medication Sig Dispense Refill  . cyclobenzaprine (FLEXERIL) 10 MG tablet Take 10 mg by mouth 3 (three) times daily as needed for muscle spasms.      . enalapril-hydrochlorothiazide (VASERETIC) 10-25 MG per tablet Take 1 tablet by mouth every morning.      . gabapentin (NEURONTIN) 600 MG tablet Take 600 mg by mouth 3 (three) times daily.      Marland Kitchen ibuprofen (ADVIL,MOTRIN) 800 MG tablet Take 800 mg by mouth 3 (three) times daily with meals.      . sertraline (ZOLOFT) 50 MG tablet Take 50 mg by mouth every morning.       . tamsulosin (FLOMAX) 0.4 MG CAPS capsule       . traZODone (DESYREL) 100 MG tablet Take 100 mg by mouth at bedtime.      Marland Kitchen HYDROcodone-acetaminophen (NORCO) 10-325 MG per tablet Take 1-2 tablets by mouth every 4 (four) hours as needed for moderate pain. Maximum dose per 24 hours - 8 pills  18 tablet  0   No current facility-administered medications for this encounter.    Physical Findings: The patient is in no acute distress. Patient is alert and oriented.  weight is 212 lb 8 oz (96.389 kg). His blood pressure is 124/79 and his pulse is 70. His respiration is 16. .  No  significant changes.  Lab Findings: Lab Results  Component Value Date   WBC 6.3 03/30/2014   HGB 14.8 03/30/2014   HCT 43.1 03/30/2014   MCV 90.7 03/30/2014   PLT 257 03/30/2014    Radiographic Findings:  Patient underwent CT imaging in our clinic for post implant dosimetry. The CT appears to demonstrate an adequate distribution of radioactive seeds throughout the prostate gland. There no seeds in her near the rectum. I suspect the final radiation plan and dosimetry will show appropriate coverage of the prostate gland.   Impression: The patient is recovering from the effects of radiation. His urinary symptoms should gradually improve over the next 4-6 months. We talked about this today. He is encouraged by his improvement already and is otherwise please with his outcome.   Plan: Today, I spent time talking to the patient about his prostate seed implant and resolving urinary symptoms. We also talked about long-term follow-up for prostate cancer following seed implant. He understands that ongoing PSA determinations and digital rectal exams will help perform surveillance to rule out disease recurrence. He understands what to expect with his PSA measures. Patient was also educated today about some of the long-term effects from radiation including a small risk for rectal bleeding and possibly erectile dysfunction. We talked about some of the general management approaches to these  potential complications. However, I did encourage the patient to contact our office or return at any point if he has questions or concerns related to his previous radiation and prostate cancer.  _____________________________________  Sheral Apley. Tammi Klippel, M.D.

## 2014-05-04 ENCOUNTER — Ambulatory Visit (INDEPENDENT_AMBULATORY_CARE_PROVIDER_SITE_OTHER): Payer: Medicare HMO | Admitting: Family Medicine

## 2014-05-04 VITALS — BP 114/80 | HR 88 | Temp 98.3°F | Resp 17 | Ht 70.0 in | Wt 207.0 lb

## 2014-05-04 DIAGNOSIS — R109 Unspecified abdominal pain: Secondary | ICD-10-CM

## 2014-05-04 DIAGNOSIS — C61 Malignant neoplasm of prostate: Secondary | ICD-10-CM

## 2014-05-04 DIAGNOSIS — R1030 Lower abdominal pain, unspecified: Secondary | ICD-10-CM

## 2014-05-04 MED ORDER — CIPROFLOXACIN HCL 500 MG PO TABS: 500.0000 mg | ORAL_TABLET | Freq: Two times a day (BID) | ORAL | Status: DC

## 2014-05-04 MED ORDER — HYDROCODONE-ACETAMINOPHEN 10-325 MG PO TABS: 1.0000 | ORAL_TABLET | ORAL | Status: DC | PRN

## 2014-05-04 MED ORDER — PREDNISONE 20 MG PO TABS: 40.0000 mg | ORAL_TABLET | Freq: Every day | ORAL | Status: DC

## 2014-05-04 NOTE — Progress Notes (Signed)
Patient ID: Justin Dunn MRN: [id number], DOB: July 29, 1961, 53 y.o. Date of Encounter: 05/04/2014, 11:38 AM This chart was scribed for Dow Adolph, MD by Cathie Hoops, ED Scribe. The patient was seen in Room 4. The patient's care was started at 11:38 AM.   Primary Physician: Vincenza Hews, NP   Chief Complaint: Follow-up  HPI: 53 y.o. year old male with history of stage T1c adenocarcinoma of the prostate presents for a surgery follow-up. Pt saw Dr. Tyler Pita at [facility] on 04/27/2014 and had 6 radioactive seeds in his prostate. During the day of his surgery he had a catheter put in and once it was removed he started having trouble passing his urine and associated severe abdominal  pain onset one month ago. He describes his pain as burning, acid-like feeling in his LLQ in abdominal pain. Pt takes Norco for his pain with some relief. He was prescribed 0.4 mg of tamsulosin with no relief. His oncologist took a urine sample with normal findings. He also received two ultrasounds of his bladder. The first findings found 100 cc and then the second findings found 198 cc. They then doubled his tamsulosin to 0.8 mg with no relief.  Pt notes his appetite is okay and that he eats. Pt also notes some sharp lower, right back pain right underneath his surgical site. Pt denies any bowel issues.   Past Medical History  Diagnosis Date  . Hypertension   . Chronic low back pain   . Anxiety   . Hypogonadism male   . Prostate cancer   . Depression   . History of gastric ulcer     as child  . ED (erectile dysfunction)   . History of colon polyps   . Lumbar spine scoliosis   . Wears glasses   . At risk for sleep apnea     STOP-BANG= 4   SENT TO PCP 04-02-2014     Home Meds: Prior to Admission medications   Medication Sig Start Date End Date Taking? Authorizing Provider  cyclobenzaprine (FLEXERIL) 10 MG tablet Take 10 mg by mouth 3 (three) times daily as needed for muscle  spasms.   Yes Historical Provider, MD  enalapril-hydrochlorothiazide (VASERETIC) 10-25 MG per tablet Take 1 tablet by mouth every morning.   Yes Tatyana A Kirichenko, PA-C  gabapentin (NEURONTIN) 600 MG tablet Take 600 mg by mouth 3 (three) times daily.   Yes Historical Provider, MD  HYDROcodone-acetaminophen (NORCO) 10-325 MG per tablet Take 1-2 tablets by mouth every 4 (four) hours as needed for moderate pain. Maximum dose per 24 hours - 8 pills 04/06/14  Yes [name], MD  ibuprofen (ADVIL,MOTRIN) 800 MG tablet Take 800 mg by mouth 3 (three) times daily with meals.   Yes Historical Provider, MD  sertraline (ZOLOFT) 50 MG tablet Take 50 mg by mouth every morning.    Yes Historical Provider, MD  tamsulosin (FLOMAX) 0.4 MG CAPS capsule  04/12/14  Yes Historical Provider, MD  traZODone (DESYREL) 100 MG tablet Take 100 mg by mouth at bedtime.   Yes Historical Provider, MD    Allergies: No Known Allergies  History   Social History  . Marital Status: Divorced    Spouse Name: N/A    Number of Children: N/A  . Years of Education: N/A   Occupational History  . Not on file.   Social History Main Topics  . Smoking status: Current Every Day Smoker -- 10 years    Types:  Cigarettes, Cigars  . Smokeless tobacco: Never Used     Comment: currently smokes 2 small cigar per day/  quit cigarettes 2011  . Alcohol Use: Yes     Comment: occasional  . Drug Use: No  . Sexual Activity: No   Other Topics Concern  . Not on file   Social History Narrative  . No narrative on file     Review of Systems: Constitutional: negative for chills, fever, night sweats, weight changes, or fatigue  HEENT: negative for vision changes, hearing loss, congestion, rhinorrhea, ST, epistaxis, or sinus pressure Cardiovascular: negative for chest pain or palpitations Respiratory: negative for hemoptysis, wheezing, shortness of breath, or cough Abdominal: negative nausea, vomiting, diarrhea, or constipation. Positive  for abdominal pain, back pain and urine decrease. Dermatological: negative for rash Neurologic: negative for headache, dizziness, or syncope All other systems reviewed and are otherwise negative with the exception to those above and in the HPI.   Physical Exam: Triage Vitals: Blood pressure 114/80, pulse 88, temperature 98.3 F (36.8 C), temperature source Oral, resp. rate 17, height 5\' 10"  (1.778 m), weight 207 lb (93.895 kg), SpO2 98.00%., Body mass index is 29.7 kg/(m^2). General: Well developed, well nourished, in no acute distress. Head: Normocephalic, atraumatic, eyes without discharge, sclera non-icteric, nares are without discharge. Bilateral auditory canals clear, TM's are without perforation, pearly grey and translucent with reflective cone of light bilaterally. Oral cavity moist, posterior pharynx without exudate, erythema, peritonsillar abscess, or post nasal drip.  Neck: Supple. No thyromegaly. Full ROM. No lymphadenopathy. Lungs: Clear bilaterally to auscultation without wheezes, rales, or rhonchi. Breathing is unlabored. Heart: RRR with S1 S2. No murmurs, rubs, or gallops appreciated. Abdomen: Soft, non-tender, non-distended with normoactive bowel sounds. No hepatomegaly. No rebound/guarding. No obvious abdominal masses. Msk:  Strength and tone normal for age. Extremities/Skin: Warm and dry. No clubbing or cyanosis. No edema. No rashes or suspicious lesions. Neuro: Alert and oriented X 3. Moves all extremities spontaneously. Gait is normal. CNII-XII grossly in tact. Psych:  Responds to questions appropriately with a normal affect.    ASSESSMENT AND PLAN:  11:55 AM- Patient informed of current plan for treatment and evaluation and agrees with plan at this time.  53 y.o. year old male with Prostate cancer - Plan: predniSONE (DELTASONE) 20 MG tablet  Lower abdominal pain - Plan: predniSONE (DELTASONE) 20 MG tablet, ciprofloxacin (CIPRO) 500 MG tablet, HYDROcodone-acetaminophen  (NORCO) 10-325 MG per tablet  My sense is that patient has a swollen prostate base of the prostate radiation seeds and is causing some inflammation as well as mild urinary retention. Hopefully the prednisone and Cipro will help. He is to get back to me in the next few days if this is not working  Signed, [name], MD 05/04/2014 11:55 AM

## 2014-05-04 NOTE — Patient Instructions (Signed)
I believe that you have acute prostate swelling which is impeding the flow of urine. This usually lasts for a few weeks after having the seed implants. I'm giving you medicine to reduce the size of the prostate and k any infection that may be in the area. In addition I'm giving him some pain reliever to get through this difficult time.

## 2014-05-05 ENCOUNTER — Emergency Department (HOSPITAL_COMMUNITY)
Admission: EM | Admit: 2014-05-05 | Discharge: 2014-05-06 | Disposition: A | Discharge status: Home / Self Care | Payer: Medicare HMO | Attending: Emergency Medicine | Admitting: Emergency Medicine

## 2014-05-05 ENCOUNTER — Encounter (HOSPITAL_COMMUNITY): Payer: Self-pay | Admitting: Emergency Medicine

## 2014-05-05 DIAGNOSIS — F329 Major depressive disorder, single episode, unspecified: Secondary | ICD-10-CM | POA: Diagnosis not present

## 2014-05-05 DIAGNOSIS — Z862 Personal history of diseases of the blood and blood-forming organs and certain disorders involving the immune mechanism: Secondary | ICD-10-CM | POA: Diagnosis not present

## 2014-05-05 DIAGNOSIS — Z79899 Other long term (current) drug therapy: Secondary | ICD-10-CM | POA: Insufficient documentation

## 2014-05-05 DIAGNOSIS — R109 Unspecified abdominal pain: Secondary | ICD-10-CM | POA: Insufficient documentation

## 2014-05-05 DIAGNOSIS — IMO0002 Reserved for concepts with insufficient information to code with codable children: Secondary | ICD-10-CM | POA: Insufficient documentation

## 2014-05-05 DIAGNOSIS — F411 Generalized anxiety disorder: Secondary | ICD-10-CM | POA: Diagnosis not present

## 2014-05-05 DIAGNOSIS — Z8739 Personal history of other diseases of the musculoskeletal system and connective tissue: Secondary | ICD-10-CM | POA: Insufficient documentation

## 2014-05-05 DIAGNOSIS — F17200 Nicotine dependence, unspecified, uncomplicated: Secondary | ICD-10-CM | POA: Insufficient documentation

## 2014-05-05 DIAGNOSIS — I10 Essential (primary) hypertension: Secondary | ICD-10-CM | POA: Insufficient documentation

## 2014-05-05 DIAGNOSIS — Z8546 Personal history of malignant neoplasm of prostate: Secondary | ICD-10-CM | POA: Insufficient documentation

## 2014-05-05 DIAGNOSIS — F3289 Other specified depressive episodes: Secondary | ICD-10-CM | POA: Diagnosis not present

## 2014-05-05 DIAGNOSIS — Z860100 Personal history of colon polyps, unspecified: Secondary | ICD-10-CM | POA: Insufficient documentation

## 2014-05-05 DIAGNOSIS — R339 Retention of urine, unspecified: Secondary | ICD-10-CM | POA: Diagnosis not present

## 2014-05-05 DIAGNOSIS — G8929 Other chronic pain: Secondary | ICD-10-CM | POA: Insufficient documentation

## 2014-05-05 DIAGNOSIS — Z792 Long term (current) use of antibiotics: Secondary | ICD-10-CM | POA: Diagnosis not present

## 2014-05-05 DIAGNOSIS — Z87448 Personal history of other diseases of urinary system: Secondary | ICD-10-CM | POA: Diagnosis not present

## 2014-05-05 DIAGNOSIS — Z8639 Personal history of other endocrine, nutritional and metabolic disease: Secondary | ICD-10-CM | POA: Insufficient documentation

## 2014-05-05 LAB — URINALYSIS, ROUTINE W REFLEX MICROSCOPIC
Bilirubin Urine: NEGATIVE
GLUCOSE, UA: NEGATIVE mg/dL
Hgb urine dipstick: NEGATIVE
KETONES UR: NEGATIVE mg/dL
Leukocytes, UA: NEGATIVE
Nitrite: NEGATIVE
PH: 6 (ref 5.0–8.0)
Protein, ur: NEGATIVE mg/dL
SPECIFIC GRAVITY, URINE: 1.027 (ref 1.005–1.030)
Urobilinogen, UA: 0.2 mg/dL (ref 0.0–1.0)

## 2014-05-05 NOTE — ED Notes (Signed)
Pt states he is unable to pass urine  Last able to void a short time ago but it is only drops noted  Pt states his urologist is Dr Arman Bogus  Pt has prostates cancer and had the seeds implanted on August 7th

## 2014-05-06 MED ORDER — LIDOCAINE HCL 2 % EX GEL
CUTANEOUS | Status: AC
  Filled 2014-05-06: qty 10

## 2014-05-06 MED ORDER — LIDOCAINE HCL 2 % EX GEL
1.0000 "application " | Freq: Once | CUTANEOUS | Status: AC
  Administered 2014-05-06: 1 via URETHRAL

## 2014-05-06 NOTE — Discharge Instructions (Signed)
Acute Urinary Retention °Acute urinary retention is the temporary inability to urinate. °This is a common problem in older men. As men age their prostates become larger and block the flow of urine from the bladder. This is usually a problem that has come on gradually.  °HOME CARE INSTRUCTIONS °If you are sent home with a Foley catheter and a drainage system, you will need to discuss the best course of action with your health care provider. While the catheter is in, maintain a good intake of fluids. Keep the drainage bag emptied and lower than your catheter. This is so that contaminated urine will not flow back into your bladder, which could lead to a urinary tract infection. °There are two main types of drainage bags. One is a large bag that usually is used at night. It has a good capacity that will allow you to sleep through the night without having to empty it. The second type is called a leg bag. It has a smaller capacity, so it needs to be emptied more frequently. However, the main advantage is that it can be attached by a leg strap and can go underneath your clothing, allowing you the freedom to move about or leave your home. °Only take over-the-counter or prescription medicines for pain, discomfort, or fever as directed by your health care provider.  °SEEK MEDICAL CARE IF: °· You develop a low-grade fever. °· You experience spasms or leakage of urine with the spasms. °SEEK IMMEDIATE MEDICAL CARE IF:  °· You develop chills or fever. °· Your catheter stops draining urine. °· Your catheter falls out. °· You start to develop increased bleeding that does not respond to rest and increased fluid intake. °MAKE SURE YOU: °· Understand these instructions. °· Will watch your condition. °· Will get help right away if you are not doing well or get worse. °Document Released: 11/23/2000 Document Revised: 08/22/2013 Document Reviewed: 01/26/2013 °Exit[employer]® Patient Information ©2015 [employer]. This information is not  intended to replace advice given to you by your health care provider. Make sure you discuss any questions you have with your health care provider. ° °

## 2014-05-06 NOTE — ED Provider Notes (Signed)
Medical screening examination/treatment/procedure(s) were performed by non-physician practitioner and as supervising physician I was immediately available for consultation/collaboration.     Veryl Speak, MD 05/06/14 [phone]

## 2014-05-06 NOTE — ED Provider Notes (Signed)
CSN: [id number]     Arrival date & time 05/05/14  2330 History   First MD Initiated Contact with Patient 05/05/14 2354     Chief Complaint  Patient presents with  . Urinary Retention     (Consider location/radiation/quality/duration/timing/severity/associated sxs/prior Treatment) HPI Comments: On August 7.  Patient had prostate seeding performed DG of prostatitis and prostate cancer.  Since that time.  He has had difficulty urinating.  Today.  He comes in unable to urinate for several hours.  His abdomen is quite distended and uncomfortable denies any nausea, vomiting, fever  The history is provided by the patient.    Past Medical History  Diagnosis Date  . Hypertension   . Chronic low back pain   . Anxiety   . Hypogonadism male   . Prostate cancer   . Depression   . History of gastric ulcer     as child  . ED (erectile dysfunction)   . History of colon polyps   . Lumbar spine scoliosis   . Wears glasses   . At risk for sleep apnea     STOP-BANG= 4   SENT TO PCP 04-02-2014   Past Surgical History  Procedure Laterality Date  . Prostate biopsy    . Lumbar fusion  04/ 2012    and rod  . Colonoscopy w/ polypectomy  july 2015  . Appendectomy  age 67  . Radioactive seed implant N/A 04/06/2014    Procedure: RADIOACTIVE SEED IMPLANT;  Surgeon: [name], MD;  Location: [facility];  Service: Urology;  Laterality: N/A;  dr portable    Family History  Problem Relation Age of Onset  . Cancer Father     prostate   History  Substance Use Topics  . Smoking status: Current Every Day Smoker -- 10 years    Types: Cigarettes, Cigars  . Smokeless tobacco: Never Used     Comment: currently smokes 2 small cigar per day/  quit cigarettes 2011  . Alcohol Use: Yes     Comment: occasional    Review of Systems  Constitutional: Negative for fever.  Gastrointestinal: Positive for abdominal pain.  Genitourinary: Positive for decreased urine volume.  All other systems  reviewed and are negative.     Allergies  Review of patient's allergies indicates no known allergies.  Home Medications   Prior to Admission medications   Medication Sig Start Date End Date Taking? Authorizing Provider  ciprofloxacin (CIPRO) 500 MG tablet Take 1 tablet (500 mg total) by mouth 2 (two) times daily. 05/04/14  Yes [name], MD  cyclobenzaprine (FLEXERIL) 10 MG tablet Take 10 mg by mouth 3 (three) times daily as needed for muscle spasms.   Yes Historical Provider, MD  enalapril-hydrochlorothiazide (VASERETIC) 10-25 MG per tablet Take 1 tablet by mouth every morning.   Yes Tatyana A Kirichenko, PA-C  gabapentin (NEURONTIN) 300 MG capsule Take 300 mg by mouth 3 (three) times daily.   Yes Historical Provider, MD  HYDROcodone-acetaminophen (NORCO) 10-325 MG per tablet Take 1-2 tablets by mouth every 4 (four) hours as needed for moderate pain. Maximum dose per 24 hours - 8 pills 05/04/14  Yes [name], MD  ibuprofen (ADVIL,MOTRIN) 800 MG tablet Take 800 mg by mouth 3 (three) times daily with meals.   Yes Historical Provider, MD  predniSONE (DELTASONE) 20 MG tablet Take 2 tablets (40 mg total) by mouth daily. 05/04/14  Yes [name], MD  sertraline (ZOLOFT) 50 MG tablet Take 50 mg by  mouth every morning.    Yes Historical Provider, MD  tamsulosin (FLOMAX) 0.4 MG CAPS capsule Take 0.4 mg by mouth daily.  04/12/14  Yes Historical Provider, MD  traZODone (DESYREL) 100 MG tablet Take 100 mg by mouth at bedtime.   Yes Historical Provider, MD   BP 139/83  Pulse 93  Temp(Src) 98.7 F (37.1 C) (Oral)  Resp 18  Ht 5\' 10"  (1.778 m)  Wt 210 lb (95.255 kg)  BMI 30.13 kg/m2  SpO2 100% Physical Exam  Nursing note and vitals reviewed. Constitutional: He is oriented to person, place, and time. He appears well-developed and well-nourished.  HENT:  Head: Normocephalic.  Eyes: Pupils are equal, round, and reactive to light.  Neck: Normal range of motion.  Cardiovascular: Normal  rate and regular rhythm.   Pulmonary/Chest: Effort normal and breath sounds normal.  Abdominal: He exhibits distension.  Musculoskeletal: Normal range of motion.  Neurological: He is alert and oriented to person, place, and time.  Skin: Skin is warm.    ED Course  Procedures (including critical care time) Labs Review Labs Reviewed  URINALYSIS, ROUTINE W REFLEX MICROSCOPIC - Abnormal; Notable for the following:    APPearance HAZY (*)    All other components within normal limits    Imaging Review No results found.   EKG Interpretation None      MDM   Final diagnoses:  Urinary retention         Garald Balding, NP 05/06/14 0104

## 2014-05-07 ENCOUNTER — Telehealth: Payer: Self-pay

## 2014-05-07 NOTE — Telephone Encounter (Signed)
Dr Carlean Jews, ED notes are in EPIC. Let me know if you'd like me to get any more info from pt and/or advise him of any instr's.

## 2014-05-07 NOTE — Telephone Encounter (Signed)
Pt called in to leave a message for Dr Joseph Art- He is having complications over the weekend. He ended up at [facility] on Sat night due to being full of urine. He is also having blood in his urine. He would like Dr Joseph Art to call him @ [phone]. Thank you

## 2014-05-08 ENCOUNTER — Ambulatory Visit (INDEPENDENT_AMBULATORY_CARE_PROVIDER_SITE_OTHER): Payer: Medicare HMO | Admitting: Family Medicine

## 2014-05-08 ENCOUNTER — Emergency Department (HOSPITAL_COMMUNITY)
Admission: EM | Admit: 2014-05-08 | Discharge: 2014-05-09 | Disposition: A | Discharge status: Home / Self Care | Payer: Medicare HMO | Attending: Emergency Medicine | Admitting: Emergency Medicine

## 2014-05-08 ENCOUNTER — Encounter (HOSPITAL_COMMUNITY): Payer: Self-pay | Admitting: Emergency Medicine

## 2014-05-08 VITALS — BP 130/85 | HR 98 | Temp 98.1°F | Resp 14 | Ht 70.0 in | Wt 206.6 lb

## 2014-05-08 DIAGNOSIS — Z792 Long term (current) use of antibiotics: Secondary | ICD-10-CM | POA: Diagnosis not present

## 2014-05-08 DIAGNOSIS — G8929 Other chronic pain: Secondary | ICD-10-CM | POA: Diagnosis not present

## 2014-05-08 DIAGNOSIS — F411 Generalized anxiety disorder: Secondary | ICD-10-CM | POA: Diagnosis not present

## 2014-05-08 DIAGNOSIS — Z8639 Personal history of other endocrine, nutritional and metabolic disease: Secondary | ICD-10-CM | POA: Insufficient documentation

## 2014-05-08 DIAGNOSIS — Z87448 Personal history of other diseases of urinary system: Secondary | ICD-10-CM | POA: Diagnosis not present

## 2014-05-08 DIAGNOSIS — Z860100 Personal history of colon polyps, unspecified: Secondary | ICD-10-CM | POA: Insufficient documentation

## 2014-05-08 DIAGNOSIS — Z862 Personal history of diseases of the blood and blood-forming organs and certain disorders involving the immune mechanism: Secondary | ICD-10-CM | POA: Diagnosis not present

## 2014-05-08 DIAGNOSIS — Z8546 Personal history of malignant neoplasm of prostate: Secondary | ICD-10-CM | POA: Diagnosis not present

## 2014-05-08 DIAGNOSIS — K1370 Unspecified lesions of oral mucosa: Secondary | ICD-10-CM

## 2014-05-08 DIAGNOSIS — Z79899 Other long term (current) drug therapy: Secondary | ICD-10-CM | POA: Insufficient documentation

## 2014-05-08 DIAGNOSIS — K121 Other forms of stomatitis: Secondary | ICD-10-CM

## 2014-05-08 DIAGNOSIS — I10 Essential (primary) hypertension: Secondary | ICD-10-CM | POA: Insufficient documentation

## 2014-05-08 DIAGNOSIS — R339 Retention of urine, unspecified: Secondary | ICD-10-CM

## 2014-05-08 DIAGNOSIS — R310 Gross hematuria: Secondary | ICD-10-CM

## 2014-05-08 DIAGNOSIS — Z791 Long term (current) use of non-steroidal anti-inflammatories (NSAID): Secondary | ICD-10-CM | POA: Insufficient documentation

## 2014-05-08 DIAGNOSIS — C61 Malignant neoplasm of prostate: Secondary | ICD-10-CM

## 2014-05-08 DIAGNOSIS — R1030 Lower abdominal pain, unspecified: Secondary | ICD-10-CM

## 2014-05-08 DIAGNOSIS — F17200 Nicotine dependence, unspecified, uncomplicated: Secondary | ICD-10-CM | POA: Insufficient documentation

## 2014-05-08 DIAGNOSIS — IMO0002 Reserved for concepts with insufficient information to code with codable children: Secondary | ICD-10-CM | POA: Diagnosis not present

## 2014-05-08 DIAGNOSIS — F3289 Other specified depressive episodes: Secondary | ICD-10-CM | POA: Diagnosis not present

## 2014-05-08 DIAGNOSIS — F329 Major depressive disorder, single episode, unspecified: Secondary | ICD-10-CM | POA: Diagnosis not present

## 2014-05-08 DIAGNOSIS — Z8739 Personal history of other diseases of the musculoskeletal system and connective tissue: Secondary | ICD-10-CM | POA: Diagnosis not present

## 2014-05-08 DIAGNOSIS — R109 Unspecified abdominal pain: Secondary | ICD-10-CM

## 2014-05-08 LAB — POCT URINALYSIS DIPSTICK
Glucose, UA: NEGATIVE
Ketones, UA: NEGATIVE
Leukocytes, UA: NEGATIVE
Nitrite, UA: POSITIVE
Protein, UA: >=300
Spec Grav, UA: 1.02
Urobilinogen, UA: 1
pH, UA: 6.5

## 2014-05-08 LAB — POCT UA - MICROSCOPIC ONLY

## 2014-05-08 MED ORDER — HYDROCODONE-ACETAMINOPHEN 10-325 MG PO TABS: 1.0000 | ORAL_TABLET | ORAL | Status: DC | PRN

## 2014-05-08 MED ORDER — ALPRAZOLAM 0.25 MG PO TABS
0.5000 mg | ORAL_TABLET | Freq: Once | ORAL | Status: AC
  Administered 2014-05-08: 0.5 mg via ORAL

## 2014-05-08 MED ORDER — OXYBUTYNIN CHLORIDE ER 10 MG PO TB24: 10.0000 mg | ORAL_TABLET | Freq: Every day | ORAL | Status: DC

## 2014-05-08 MED ORDER — LIDOCAINE HCL 2 % EX GEL
1.0000 "application " | Freq: Once | CUTANEOUS | Status: AC
  Administered 2014-05-08: 1 via URETHRAL
  Filled 2014-05-08: qty 10

## 2014-05-08 MED ORDER — OXYBUTYNIN CHLORIDE 5 MG PO TABS
10.0000 mg | ORAL_TABLET | Freq: Once | ORAL | Status: AC
  Administered 2014-05-08: 10 mg via ORAL
  Filled 2014-05-08: qty 2

## 2014-05-08 MED ORDER — TAMSULOSIN HCL 0.4 MG PO CAPS: 0.4000 mg | ORAL_CAPSULE | Freq: Every day | ORAL | Status: DC

## 2014-05-08 NOTE — Consult Note (Signed)
Urology Consult   Physician requesting consult: Dr. Joseph Berkshire, ER  Reason for consult: Urinary retention  History of Present Illness: Justin Dunn is a 53 y.o. male with history of low risk prostate cancer who underwent prostate seed implantation on 04/06/2014. He had urinary retention with a bladder scan of 765mL on 05/06/2014 for which he had a foley placed. He also apparently went to urgent care on 9/4 and was given cipro and predisone for presumed prostate inflammation thought to be contributing to his retention. He had gross blood in his catheter so he went to urgent care this evening. When attempting to change his catheter, they had difficulty removing the foley as the balloon wouldn't deflate. They eventually were able to remove the catheter with the balloon partially inflated. He refused replacement at urgent care so he came to the ER. He was having significant suprapubic pain and urgency on presentation to the ER.    Past Medical History  Diagnosis Date  . Hypertension   . Chronic low back pain   . Anxiety   . Hypogonadism male   . Depression   . History of gastric ulcer     as child  . ED (erectile dysfunction)   . History of colon polyps   . Lumbar spine scoliosis   . Wears glasses   . At risk for sleep apnea     STOP-BANG= 4   SENT TO PCP 04-02-2014  . Prostate cancer     Dr Tammi Klippel ( oncologist) and Dr Loel Lofty [facility])     Past Surgical History  Procedure Laterality Date  . Prostate biopsy    . Lumbar fusion  04/ 2012    and rod  . Colonoscopy w/ polypectomy  july 2015  . Appendectomy  age 70  . Radioactive seed implant N/A 04/06/2014    Procedure: RADIOACTIVE SEED IMPLANT;  Surgeon: [name], MD;  Location: [facility];  Service: Urology;  Laterality: N/A;  dr portable      Current Hospital Medications:  Home meds:    Medication List    ASK your doctor about these medications       ciprofloxacin 500 MG tablet   Commonly known as:  CIPRO  Take 1 tablet (500 mg total) by mouth 2 (two) times daily.     cyclobenzaprine 10 MG tablet  Commonly known as:  FLEXERIL  Take 10 mg by mouth 3 (three) times daily as needed for muscle spasms.     enalapril-hydrochlorothiazide 10-25 MG per tablet  Commonly known as:  VASERETIC  Take 1 tablet by mouth every morning.     gabapentin 300 MG capsule  Commonly known as:  NEURONTIN  Take 300 mg by mouth 3 (three) times daily.     HYDROcodone-acetaminophen 10-325 MG per tablet  Commonly known as:  NORCO  Take 1-2 tablets by mouth every 4 (four) hours as needed for moderate pain. Maximum dose per 24 hours - 8 pills     ibuprofen 800 MG tablet  Commonly known as:  ADVIL,MOTRIN  Take 800 mg by mouth 3 (three) times daily with meals.     predniSONE 20 MG tablet  Commonly known as:  DELTASONE  Take 2 tablets (40 mg total) by mouth daily.     sertraline 50 MG tablet  Commonly known as:  ZOLOFT  Take 50 mg by mouth every morning.     tamsulosin 0.4 MG Caps capsule  Commonly known as:  FLOMAX  Take 1 capsule (0.4 mg total) by mouth daily.     traZODone 100 MG tablet  Commonly known as:  DESYREL  Take 100 mg by mouth at bedtime.        Scheduled Meds: . ALPRAZolam  0.5 mg Oral Once  . lidocaine  1 application Urethral Once   Continuous Infusions:  PRN Meds:.  Allergies: No Known Allergies  Family History  Problem Relation Age of Onset  . Cancer Father     prostate    Social History:  reports that he has been smoking Cigarettes and Cigars.  He has been smoking about 0.00 packs per day for the past 10 years. He has never used smokeless tobacco. He reports that he drinks alcohol. He reports that he does not use illicit drugs.  ROS: A complete review of systems was performed.  All systems are negative except for pertinent findings as noted.  Physical Exam:  Vital signs in last 24 hours: Temp:  [97.4 F (36.3 C)-98.1 F (36.7 C)] 97.4 F (36.3  C) (09/08 2158) Pulse Rate:  [94-98] 94 (09/08 2158) Resp:  [14-18] 18 (09/08 2158) BP: (130-140)/(85-91) 140/91 mmHg (09/08 2158) SpO2:  [98 %-100 %] 100 % (09/08 2158) Weight:  [93.713 kg (206 lb 9.6 oz)] 93.713 kg (206 lb 9.6 oz) (09/08 1639) Constitutional:  Alert and oriented, No acute distress Cardiovascular: Regular rate and rhythm, No JVD Respiratory: Normal respiratory effort, Lungs clear bilaterally GI: Abdomen is soft, nontender, nondistended, no abdominal masses GU: No CVA tenderness. Normal penis and testicles. Urine was initially clear yellow then more red. Irrigated clear without clots. Lymphatic: No lymphadenopathy Neurologic: Grossly intact, no focal deficits Psychiatric: Normal mood and affect  Laboratory Data:  No results found for this basename: WBC, HGB, HCT, PLT,  in the last 72 hours  No results found for this basename: NA, K, CL, CO3, GLUCOSE, BUN, CALCIUM, CREATININE,  in the last 72 hours   Results for orders placed in visit on 05/08/14 (from the past 24 hour(s))  POCT URINALYSIS DIPSTICK     Status: None   Collection Time    05/08/14  5:22 PM      Result Value Ref Range   Color, UA red     Clarity, UA turbid     Glucose, UA neg     Bilirubin, UA small     Ketones, UA neg     Spec Grav, UA 1.020     Blood, UA large     pH, UA 6.5     Protein, UA >=300     Urobilinogen, UA 1.0     Nitrite, UA pos     Leukocytes, UA Negative    POCT UA - MICROSCOPIC ONLY     Status: None   Collection Time    05/08/14  5:22 PM      Result Value Ref Range   WBC, Ur, HPF, POC       RBC, urine, microscopic TNTC     Bacteria, U Microscopic       Mucus, UA       Epithelial cells, urine per micros       Crystals, Ur, HPF, POC       Casts, Ur, LPF, POC       Yeast, UA       No results found for this or any previous visit (from the past 240 hour(s)).  Renal Function: No results found for this basename: CREATININE,  in the last 168 hours  The CrCl is unknown  because both a height and weight (above a minimum accepted value) are required for this calculation.  Radiologic Imaging: No results found.   Cystoscopy procedure: Patient was prepped in the usual sterile fashion. An 18Fr Coude catheter was placed with some resistance at the bladder neck, largely due to the patient being tense. He was actively having bladder spasms with catheter placement. Clear yellow urine ultimately returned, followed by progressively more red urine. This was irrigated to clear.   Impression/Recommendation: 33M with urinary retention after prostate seed placement. Foley was placed in ER 9/6. Exchanged by urgent care 9/8 with traumatic removal. Foley was replaced in ER by Urology on 9/8 with mild hematuria that was irrigated to clear.  Recs: -- oxybutynin PRN for bladder spasms -- continue flomax 0.8mg  qhs -- follow up as scheduled with Dr. Karsten Ro on Thursday

## 2014-05-08 NOTE — Progress Notes (Signed)
Chief Complaint:  Chief Complaint  Patient presents with  . Hematuria    Pt. went to the hospital on Saturday, noticed blood in urine afterwards  . Tongue    Pt. has developed a sore on the right side of the tongue    HPI: Justin Dunn is a 53 y.o. male who has a history of prostate cancer with recent radiation seeding  followed by Dr Karsten Ro at [facility] urology and also with Dr Tammi Klippel with oncology who is here with recurrent urinary retention, he was recently in [facility] ED for the same thing on Saturday , 4 days ago, and indwelling foley cath was put in and he was able to urinate afterwards. He  feels there is a clot in his cath that was put int he ED since not draining as much urine. The bladder scan in the ED showed 700 cc of residual urine and when he checked his output after placement of catheter he had 900 cc of output.  One day prior to this he was seen here and  he was placed on cipro and prednisone, he was on it for 2 days before he stopped and went to ED and they told him to stop because his urine did not show infection. He now has a tongue ulcer on the right side of his tongue which started after he took the cipro and prednisone, he denies any prior hx of canker sores. He has had no fevers or chills.He was given flomax on August 13 by Dr Karsten Ro to help with his  Flow but it has not taken it since he ran out, he states he was dribbling but not urinating well even on flomax. He is out of flomax and wants a refill. He has an appt with Dr Karsten Ro this Thursday.    Past Medical History  Diagnosis Date  . Hypertension   . Chronic low back pain   . Anxiety   . Hypogonadism male   . Depression   . History of gastric ulcer     as child  . ED (erectile dysfunction)   . History of colon polyps   . Lumbar spine scoliosis   . Wears glasses   . At risk for sleep apnea     STOP-BANG= 4   SENT TO PCP 04-02-2014  . Prostate cancer     Dr Tammi Klippel ( oncologist) and Dr Loel Lofty [facility])    Past Surgical History  Procedure Laterality Date  . Prostate biopsy    . Lumbar fusion  04/ 2012    and rod  . Colonoscopy w/ polypectomy  july 2015  . Appendectomy  age 31  . Radioactive seed implant N/A 04/06/2014    Procedure: RADIOACTIVE SEED IMPLANT;  Surgeon: [name], MD;  Location: [facility];  Service: Urology;  Laterality: N/A;  dr portable    History   Social History  . Marital Status: Divorced    Spouse Name: N/A    Number of Children: N/A  . Years of Education: N/A   Social History Main Topics  . Smoking status: Current Every Day Smoker -- 10 years    Types: Cigarettes, Cigars  . Smokeless tobacco: Never Used     Comment: currently smokes 2 small cigar per day/  quit cigarettes 2011  . Alcohol Use: Yes     Comment: occasional  . Drug Use: No  . Sexual Activity: No   Other Topics Concern  .  None   Social History Narrative  . None   Family History  Problem Relation Age of Onset  . Cancer Father     prostate   No Known Allergies Prior to Admission medications   Medication Sig Start Date End Date Taking? Authorizing Provider  cyclobenzaprine (FLEXERIL) 10 MG tablet Take 10 mg by mouth 3 (three) times daily as needed for muscle spasms.   Yes Historical Provider, MD  enalapril-hydrochlorothiazide (VASERETIC) 10-25 MG per tablet Take 1 tablet by mouth every morning.   Yes Tatyana A Kirichenko, PA-C  gabapentin (NEURONTIN) 300 MG capsule Take 300 mg by mouth 3 (three) times daily.   Yes Historical Provider, MD  HYDROcodone-acetaminophen (NORCO) 10-325 MG per tablet Take 1-2 tablets by mouth every 4 (four) hours as needed for moderate pain. Maximum dose per 24 hours - 8 pills 05/04/14  Yes [name], MD  ibuprofen (ADVIL,MOTRIN) 800 MG tablet Take 800 mg by mouth 3 (three) times daily with meals.   Yes Historical Provider, MD  sertraline (ZOLOFT) 50 MG tablet Take 50 mg by mouth every morning.    Yes Historical Provider,  MD  tamsulosin (FLOMAX) 0.4 MG CAPS capsule Take 0.4 mg by mouth daily.  04/12/14  Yes Historical Provider, MD  traZODone (DESYREL) 100 MG tablet Take 100 mg by mouth at bedtime.   Yes Historical Provider, MD  ciprofloxacin (CIPRO) 500 MG tablet Take 1 tablet (500 mg total) by mouth 2 (two) times daily. 05/04/14   [name], MD  predniSONE (DELTASONE) 20 MG tablet Take 2 tablets (40 mg total) by mouth daily. 05/04/14   [name], MD     ROS: The patient denies fevers, chills, night sweats, unintentional weight loss, chest pain, palpitations, wheezing, dyspnea on exertion, nausea, vomiting, abdominal pain, dysuria, melena, acute numbness, weakness, or tingling. + hematuria,   All other systems have been reviewed and were otherwise negative with the exception of those mentioned in the HPI and as above.    PHYSICAL EXAM: Filed Vitals:   05/08/14 1639  BP: 130/85  Pulse: 98  Temp: 98.1 F (36.7 C)  Resp: 14   Filed Vitals:   05/08/14 1639  Height: 5\' 10"  (1.778 m)  Weight: 206 lb 9.6 oz (93.713 kg)   Body mass index is 29.64 kg/(m^2).  General: Alert, no acute distress HEENT:  Normocephalic, atraumatic, oropharynx patent. EOMI, PERRLA Cardiovascular:  Regular rate and rhythm, no rubs murmurs or gallops.  No Carotid bruits, radial pulse intact. No pedal edema.  Respiratory: Clear to auscultation bilaterally.  No wheezes, rales, or rhonchi.  No cyanosis, no use of accessory musculature GI: No organomegaly, abdomen is soft and non-tender, positive bowel sounds.  No masses. Skin: No rashes. Neurologic: Facial musculature symmetric. Psychiatric: Patient is appropriate throughout our interaction. Lymphatic: No cervical lymphadenopathy Musculoskeletal: Gait intact. He has gross hematuria in foley bag, there is mucus. Testicles and penis show no rashes or tenderness   LABS: Results for orders placed in visit on 05/08/14  POCT URINALYSIS DIPSTICK      Result Value Ref Range    Color, UA red     Clarity, UA turbid     Glucose, UA neg     Bilirubin, UA small     Ketones, UA neg     Spec Grav, UA 1.020     Blood, UA large     pH, UA 6.5     Protein, UA >=300     Urobilinogen, UA 1.0  Nitrite, UA pos     Leukocytes, UA Negative    POCT UA - MICROSCOPIC ONLY      Result Value Ref Range   WBC, Ur, HPF, POC       RBC, urine, microscopic TNTC     Bacteria, U Microscopic       Mucus, UA       Epithelial cells, urine per micros       Crystals, Ur, HPF, POC       Casts, Ur, LPF, POC       Yeast, UA         EKG/XRAY:   Primary read interpreted by Dr. Marin Comment at [facility].   ASSESSMENT/PLAN: Encounter Diagnoses  Name Primary?  . Urinary retention Yes  . Prostate cancer   . Mouth ulcer   . Lower abdominal pain   . Gross hematuria    Verbal consent was obtained and He would like to pursue with foley cath removal and  Insertion of  new foley cath, We used the same type 16 French Catheter Foley bulb was tested and worked,sterile technique used Insertion of cath was smooth, inflation of bulb with 5 cc of sterile water had no resistance but then attempted to push more and the patient had resistance so tried to adjust cath and deflate bulb but was unable to remove much fluid out of bulb with aspiration with syringe, About 1 cc was removed so he had about 3-4 cc left in foley bulb. Attempted different manuevers, tried milking the foley , adjusting the foley but nothing worked, cut the foley tubing and no fluid drained out as expected Called urologist on Call Dr Wynetta Emery with [facility] and spoke with him, he recommended pulling it out all the way and reinserting a new foley if patient able to tolerate. Foley was removed and once it was pulled the bulb automatically deflated and fluid was drained. Patient declined to have foley reinserted in our office since we do not have coude caths and other things he would need if second attempt was unsuccessful.  He had no urine output  during this whole time, he had urine output in the old bag and he had recently poured it out.So was not sure if he had any urine in his bladder to drain He was given a refill of his flomax and encouraged to use it, he was also given refill of norco.  Contaminated collection of urine since from bag but wil go ahead and culture, no meds for this currently He was given 0.5 mg of Xanax in the office  He will go to [facility] ER, spoke with Dr Wynetta Emery about his case and will call ER to let them know that they need to use a  18 french [place], if they need help then urology already knows about him. Notified ER triage/charge F/u prn   Gross sideeffects, risk and benefits, and alternatives of medications d/w patient. Patient is aware that all medications have potential sideeffects and we are unable to predict every sideeffect or drug-drug interaction that may occur.  LE, [place], DO 05/08/2014 7:36 PM

## 2014-05-08 NOTE — ED Provider Notes (Addendum)
CSN: [id number]     Arrival date & time 05/08/14  2144 History   First MD Initiated Contact with Patient 05/08/14 2207     Chief Complaint  Patient presents with  . Urinary Retention     (Consider location/radiation/quality/duration/timing/severity/associated sxs/prior Treatment) HPI Comments: Patient presents to the ER for evaluation of acute urinary retention. Patient reports that he was seen in the emergency department 4 days ago and had a Foley catheter placed. The day after the catheter was placed he started to have bleeding. Patient reports that for the last 3 days he has had hematuria and today the catheter stopped draining. He was seen at an outside [place] office and an attempt was made to place the catheter. The Foley catheter was removed, but they were unable to place a new catheter, he was referred to the ER for evaluation. Patient complains of severe lower abdominal pain in the area of his bladder. Pain is constant, 10 out of 10.   Past Medical History  Diagnosis Date  . Hypertension   . Chronic low back pain   . Anxiety   . Hypogonadism male   . Depression   . History of gastric ulcer     as child  . ED (erectile dysfunction)   . History of colon polyps   . Lumbar spine scoliosis   . Wears glasses   . At risk for sleep apnea     STOP-BANG= 4   SENT TO PCP 04-02-2014  . Prostate cancer     Dr Tammi Klippel ( oncologist) and Dr Loel Lofty [facility])    Past Surgical History  Procedure Laterality Date  . Prostate biopsy    . Lumbar fusion  04/ 2012    and rod  . Colonoscopy w/ polypectomy  july 2015  . Appendectomy  age 60  . Radioactive seed implant N/A 04/06/2014    Procedure: RADIOACTIVE SEED IMPLANT;  Surgeon: [name], MD;  Location: [facility];  Service: Urology;  Laterality: N/A;  dr portable    Family History  Problem Relation Age of Onset  . Cancer Father     prostate   History  Substance Use Topics  . Smoking status: Current Every  Day Smoker -- 10 years    Types: Cigarettes, Cigars  . Smokeless tobacco: Never Used     Comment: currently smokes 2 small cigar per day/  quit cigarettes 2011  . Alcohol Use: Yes     Comment: occasional    Review of Systems  Gastrointestinal: Positive for abdominal pain.  Genitourinary: Positive for decreased urine volume.  All other systems reviewed and are negative.     Allergies  Review of patient's allergies indicates no known allergies.  Home Medications   Prior to Admission medications   Medication Sig Start Date End Date Taking? Authorizing Provider  ciprofloxacin (CIPRO) 500 MG tablet Take 1 tablet (500 mg total) by mouth 2 (two) times daily. 05/04/14   [name], MD  cyclobenzaprine (FLEXERIL) 10 MG tablet Take 10 mg by mouth 3 (three) times daily as needed for muscle spasms.    Historical Provider, MD  enalapril-hydrochlorothiazide (VASERETIC) 10-25 MG per tablet Take 1 tablet by mouth every morning.    Tatyana A Kirichenko, PA-C  gabapentin (NEURONTIN) 300 MG capsule Take 300 mg by mouth 3 (three) times daily.    Historical Provider, MD  HYDROcodone-acetaminophen (NORCO) 10-325 MG per tablet Take 1-2 tablets by mouth every 4 (four) hours as needed for moderate  pain. Maximum dose per 24 hours - 8 pills 05/08/14   Thao P Le, DO  ibuprofen (ADVIL,MOTRIN) 800 MG tablet Take 800 mg by mouth 3 (three) times daily with meals.    Historical Provider, MD  predniSONE (DELTASONE) 20 MG tablet Take 2 tablets (40 mg total) by mouth daily. 05/04/14   [name], MD  sertraline (ZOLOFT) 50 MG tablet Take 50 mg by mouth every morning.     Historical Provider, MD  tamsulosin (FLOMAX) 0.4 MG CAPS capsule Take 1 capsule (0.4 mg total) by mouth daily. 05/08/14   Thao P Le, DO  traZODone (DESYREL) 100 MG tablet Take 100 mg by mouth at bedtime.    Historical Provider, MD   BP 140/91  Pulse 94  Temp(Src) 97.4 F (36.3 C) (Oral)  Resp 18  SpO2 100% Physical Exam  Constitutional: He is  oriented to person, place, and time. He appears well-developed and well-nourished. No distress.  HENT:  Head: Normocephalic and atraumatic.  Right Ear: Hearing normal.  Left Ear: Hearing normal.  Nose: Nose normal.  Mouth/Throat: Oropharynx is clear and moist and mucous membranes are normal.  Eyes: Conjunctivae and EOM are normal. Pupils are equal, round, and reactive to light.  Neck: Normal range of motion. Neck supple.  Cardiovascular: Regular rhythm, S1 normal and S2 normal.  Exam reveals no gallop and no friction rub.   No murmur heard. Pulmonary/Chest: Effort normal and breath sounds normal. No respiratory distress. He exhibits no tenderness.  Abdominal: Soft. Normal appearance and bowel sounds are normal. There is no hepatosplenomegaly. There is tenderness in the suprapubic area. There is no rebound, no guarding, no tenderness at McBurney's point and negative Murphy's sign. No hernia.  Musculoskeletal: Normal range of motion.  Neurological: He is alert and oriented to person, place, and time. He has normal strength. No cranial nerve deficit or sensory deficit. Coordination normal. GCS eye subscore is 4. GCS verbal subscore is 5. GCS motor subscore is 6.  Skin: Skin is warm, dry and intact. No rash noted. No cyanosis.  Psychiatric: He has a normal mood and affect. His speech is normal and behavior is normal. Thought content normal.    ED Course  Procedures (including critical care time) Labs Review Labs Reviewed - No data to display  Imaging Review No results found.   EKG Interpretation None      MDM   Final diagnoses:  None   acute urinary retention  Hematuria  Presents to the ER for acute urinary retention. Patient previously was diagnosed with acute urinary retention and had a Foley catheter placed. He had subsequent blockage of the catheter secondary to blood clots and hematuria. Returns with severe pain secondary to retention. Patient was met in the ER by urology.  They were able to replace the Foley catheter, the bladder was irrigated. At the request of urology, start oxybutynin. Patient has followup in the office in 2 days.   Orpah Greek, MD 05/08/14 2316  Orpah Greek, MD 05/22/14 Laureen Abrahams

## 2014-05-08 NOTE — Discharge Instructions (Signed)
Acute Urinary Retention °Acute urinary retention is the temporary inability to urinate. °This is a common problem in older men. As men age their prostates become larger and block the flow of urine from the bladder. This is usually a problem that has come on gradually.  °HOME CARE INSTRUCTIONS °If you are sent home with a Foley catheter and a drainage system, you will need to discuss the best course of action with your health care provider. While the catheter is in, maintain a good intake of fluids. Keep the drainage bag emptied and lower than your catheter. This is so that contaminated urine will not flow back into your bladder, which could lead to a urinary tract infection. °There are two main types of drainage bags. One is a large bag that usually is used at night. It has a good capacity that will allow you to sleep through the night without having to empty it. The second type is called a leg bag. It has a smaller capacity, so it needs to be emptied more frequently. However, the main advantage is that it can be attached by a leg strap and can go underneath your clothing, allowing you the freedom to move about or leave your home. °Only take over-the-counter or prescription medicines for pain, discomfort, or fever as directed by your health care provider.  °SEEK MEDICAL CARE IF: °· You develop a low-grade fever. °· You experience spasms or leakage of urine with the spasms. °SEEK IMMEDIATE MEDICAL CARE IF:  °· You develop chills or fever. °· Your catheter stops draining urine. °· Your catheter falls out. °· You start to develop increased bleeding that does not respond to rest and increased fluid intake. °MAKE SURE YOU: °· Understand these instructions. °· Will watch your condition. °· Will get help right away if you are not doing well or get worse. °Document Released: 11/23/2000 Document Revised: 08/22/2013 Document Reviewed: 01/26/2013 °Exit[employer]® Patient Information ©2015 [employer]. This information is not  intended to replace advice given to you by your health care provider. Make sure you discuss any questions you have with your health care provider. ° °

## 2014-05-08 NOTE — ED Notes (Signed)
Pt also reports "sores in my mouth" x3 days.

## 2014-05-08 NOTE — Consult Note (Signed)
S: Pt seen and examined with Dr. Wynetta Emery. I spoke with him thsi AM, and arranged for him to be seen in the office , but he was given an appointment for Thursday instead.  O: Pt was seen at Urgent care because foley was not working and would not come out. Catheter cut, but would not come out. Dr. Wynetta Emery called, and advised pulling on catheter, which then came out-with ballon inflated. Pt then was seen at [facility]. New foley replaced per Dr. Theadora Rama without trauma. Clear urine obtained ( 300cc). Pt relieved.  A. Post I-125 seed Rx with continued urinary retention. Pt wil take flomax, 2 tabs hs. 2. Notify Dr. Karsten Ro in AM.

## 2014-05-08 NOTE — ED Notes (Signed)
Pt A+Ox4, reports urinary retention since 1730 today, was seen in ED x4 days ago for same with catheter placed.  Pt reports hematuria in catheter bag x3 days, seen at PCP office today with cath removed and they attempted to place another cath "but it got stuck and they gave me xanax and pulled it out".  Pt reports unable to void since.  10/10 lower abd pain and reports feeling as though he has to go but unable to void.  Skin PWD.  Speaking full/clear sentences.  MAEI, ambulating in room for comfort.

## 2014-05-09 NOTE — Telephone Encounter (Signed)
Spoke with patient. He is tired, did not get home until midnight last night from ER, foley was placed by urology successfully with successful urine output. He is having pain with urination still. His f/u is still with Dr Loel Lofty tomorrow on Thursday

## 2014-05-10 LAB — URINE CULTURE
Colony Count: NO GROWTH
Organism ID, Bacteria: NO GROWTH

## 2014-05-11 ENCOUNTER — Ambulatory Visit: Payer: Medicare HMO | Admitting: Family Medicine

## 2014-05-22 ENCOUNTER — Encounter: Payer: Self-pay | Admitting: Radiation Oncology

## 2014-05-22 DIAGNOSIS — C61 Malignant neoplasm of prostate: Secondary | ICD-10-CM | POA: Diagnosis not present

## 2014-05-23 ENCOUNTER — Telehealth: Payer: Self-pay | Admitting: *Deleted

## 2014-05-23 NOTE — Telephone Encounter (Signed)
OK to send 90-day supply of his chronic meds

## 2014-05-23 NOTE — Telephone Encounter (Signed)
Received fax from [employer] that pt should receive 90 day supply of routine medication. This would save the pt money. They are asking for a #90 on his enalapril/HCTZ.  Please advise.

## 2014-05-24 MED ORDER — ENALAPRIL-HYDROCHLOROTHIAZIDE 10-25 MG PO TABS: 1.0000 | ORAL_TABLET | Freq: Every morning | ORAL | Status: DC

## 2014-05-27 NOTE — Progress Notes (Signed)
  Radiation Oncology         [phone] ________________________________  Name: Justin Dunn MRN: [id number]  Date: 05/22/2014  DOB: 07/30/1961  Complex Isodose Planning Note Prostate Brachytherapy  Diagnosis: 53 y.o. gentleman with stage T1c adenocarcinoma of the prostate with a Gleason's score of 3+3 and a PSA of 5.98  Narrative: On a previous date, Justin Dunn returned following prostate seed implantation for post implant planning. He underwent CT scan complex simulation to delineate the three-dimensional structures of the pelvis and demonstrate the radiation distribution.  Since that time, the seed localization, and complex isodose planning with dose volume histograms have now been completed.  Results:   Prostate Coverage - The dose of radiation delivered to the 90% or more of the prostate gland (D90) was 115.03% of the prescription dose. This exceeds our goal of greater than 90%. Rectal Sparing - The volume of rectal tissue receiving the prescription dose or higher was 0.26 cc. This falls under our thresholds tolerance of 1.0 cc.  Impression: The prostate seed implant appears to show adequate target coverage and appropriate rectal sparing.  Plan:  The patient will continue to follow with urology for ongoing PSA determinations. I would anticipate a high likelihood for local tumor control with minimal risk for rectal morbidity.  ________________________________  Sheral Apley Tammi Klippel, M.D.

## 2014-05-28 ENCOUNTER — Telehealth: Payer: Self-pay

## 2014-05-28 NOTE — Telephone Encounter (Signed)
Patient left a message with our after hours answering service stating he was having trouble urinating. I Contacted patient to get more information and per patient this started when the catheter was inserted. Patient only wants to see Dr. Joseph Art and I let him know he was out of the office today but would be here tomorrow. Patient requesting a nurse to contact him in the mean time. Patients call back number is [phone]

## 2014-05-29 ENCOUNTER — Ambulatory Visit (INDEPENDENT_AMBULATORY_CARE_PROVIDER_SITE_OTHER): Payer: Medicare HMO | Admitting: Family Medicine

## 2014-05-29 VITALS — BP 132/80 | HR 94 | Temp 98.0°F | Resp 16 | Ht 70.0 in | Wt 200.2 lb

## 2014-05-29 DIAGNOSIS — F411 Generalized anxiety disorder: Secondary | ICD-10-CM

## 2014-05-29 DIAGNOSIS — R300 Dysuria: Secondary | ICD-10-CM

## 2014-05-29 DIAGNOSIS — R339 Retention of urine, unspecified: Secondary | ICD-10-CM

## 2014-05-29 LAB — COMPREHENSIVE METABOLIC PANEL
ALT: 14 U/L (ref 0–53)
AST: 14 U/L (ref 0–37)
Albumin: 3.8 g/dL (ref 3.5–5.2)
Alkaline Phosphatase: 60 U/L (ref 39–117)
BUN: 19 mg/dL (ref 6–23)
CO2: 25 mEq/L (ref 19–32)
Calcium: 8.9 mg/dL (ref 8.4–10.5)
Chloride: 104 mEq/L (ref 96–112)
Creat: 1 mg/dL (ref 0.50–1.35)
Glucose, Bld: 88 mg/dL (ref 70–99)
Potassium: 4 mEq/L (ref 3.5–5.3)
Sodium: 140 mEq/L (ref 135–145)
Total Bilirubin: 0.4 mg/dL (ref 0.2–1.2)
Total Protein: 6.3 g/dL (ref 6.0–8.3)

## 2014-05-29 LAB — POCT URINALYSIS DIPSTICK
Bilirubin, UA: NEGATIVE
Glucose, UA: NEGATIVE
Nitrite, UA: POSITIVE
Protein, UA: 100
Spec Grav, UA: 1.025
Urobilinogen, UA: 0.2
pH, UA: 5.5

## 2014-05-29 LAB — POCT UA - MICROSCOPIC ONLY
Bacteria, U Microscopic: NEGATIVE
Casts, Ur, LPF, POC: NEGATIVE
Crystals, Ur, HPF, POC: NEGATIVE
Epithelial cells, urine per micros: NEGATIVE
Mucus, UA: NEGATIVE
RBC, urine, microscopic: NEGATIVE
Yeast, UA: NEGATIVE

## 2014-05-29 MED ORDER — PHENAZOPYRIDINE HCL 200 MG PO TABS: 200.0000 mg | ORAL_TABLET | Freq: Three times a day (TID) | ORAL | Status: DC | PRN

## 2014-05-29 MED ORDER — LEVOFLOXACIN 500 MG PO TABS: 500.0000 mg | ORAL_TABLET | Freq: Every day | ORAL | Status: DC

## 2014-05-29 MED ORDER — ALPRAZOLAM ER 0.5 MG PO TB24: 0.5000 mg | ORAL_TABLET | Freq: Every day | ORAL | Status: DC

## 2014-05-29 NOTE — Progress Notes (Signed)
53 yo with prostate ca dx'd and treated with seed implants.  He is having difficulty voiding with burning and his stools are yellow.  He does not have urinary frequency.  He has been treated with Flomax and Cipro.  He has lost 10 pounds in past several weeks.  Cares for autistic son.  UP to date on colonoscopy.  Had a noncancerous polyp (Dr. Benson Norway)  Objective:  NAD Wt Readings from Last 3 Encounters:  05/29/14 200 lb 3.2 oz (90.81 kg)  05/08/14 206 lb 9.6 oz (93.713 kg)  05/05/14 210 lb (95.255 kg)   Results for orders placed in visit on 05/08/14  URINE CULTURE      Result Value Ref Range   Colony Count NO GROWTH     Organism ID, Bacteria NO GROWTH    POCT URINALYSIS DIPSTICK      Result Value Ref Range   Color, UA red     Clarity, UA turbid     Glucose, UA neg     Bilirubin, UA small     Ketones, UA neg     Spec Grav, UA 1.020     Blood, UA large     pH, UA 6.5     Protein, UA >=300     Urobilinogen, UA 1.0     Nitrite, UA pos     Leukocytes, UA Negative    POCT UA - MICROSCOPIC ONLY      Result Value Ref Range   WBC, Ur, HPF, POC       RBC, urine, microscopic TNTC     Bacteria, U Microscopic       Mucus, UA       Epithelial cells, urine per micros       Crystals, Ur, HPF, POC       Casts, Ur, LPF, POC       Yeast, UA       Results for orders placed in visit on 05/29/14  POCT UA - MICROSCOPIC ONLY      Result Value Ref Range   WBC, Ur, HPF, POC TNTC     RBC, urine, microscopic neg     Bacteria, U Microscopic neg     Mucus, UA neg     Epithelial cells, urine per micros neg     Crystals, Ur, HPF, POC neg     Casts, Ur, LPF, POC neg     Yeast, UA neg    POCT URINALYSIS DIPSTICK      Result Value Ref Range   Color, UA yellow     Clarity, UA cloudy     Glucose, UA neg     Bilirubin, UA neg     Ketones, UA trace     Spec Grav, UA 1.025     Blood, UA mod     pH, UA 5.5     Protein, UA 100     Urobilinogen, UA 0.2     Nitrite, UA positive     Leukocytes, UA  small (1+)     Dysuria - Plan: POCT UA - Microscopic Only, POCT urinalysis dipstick, Urine culture, levofloxacin (LEVAQUIN) 500 MG tablet, phenazopyridine (PYRIDIUM) 200 MG tablet  Urinary retention - Plan: Comprehensive metabolic panel, PSA, levofloxacin (LEVAQUIN) 500 MG tablet  Signed, [name], MD

## 2014-05-29 NOTE — Patient Instructions (Signed)

## 2014-05-30 ENCOUNTER — Encounter: Payer: Self-pay | Admitting: Family Medicine

## 2014-05-30 ENCOUNTER — Other Ambulatory Visit: Payer: Self-pay | Admitting: Family Medicine

## 2014-05-30 ENCOUNTER — Telehealth: Payer: Self-pay

## 2014-05-30 DIAGNOSIS — E291 Testicular hypofunction: Secondary | ICD-10-CM

## 2014-05-30 DIAGNOSIS — T83511D Infection and inflammatory reaction due to indwelling urethral catheter, subsequent encounter: Principal | ICD-10-CM

## 2014-05-30 DIAGNOSIS — N390 Urinary tract infection, site not specified: Secondary | ICD-10-CM

## 2014-05-30 DIAGNOSIS — C61 Malignant neoplasm of prostate: Secondary | ICD-10-CM

## 2014-05-30 LAB — PSA: PSA: 6.64 ng/mL — ABNORMAL HIGH (ref <=4.00)

## 2014-05-30 MED ORDER — AMOXICILLIN-POT CLAVULANATE 875-125 MG PO TABS: 1.0000 | ORAL_TABLET | Freq: Two times a day (BID) | ORAL | Status: DC

## 2014-05-30 NOTE — Telephone Encounter (Signed)
Pt requesting a call from Dr. Carlean Jews regarding symptoms he is having due to a medication, he did not want to get into the details with me,a nd requested to hear from Dr. Carlean Jews today.

## 2014-05-30 NOTE — Telephone Encounter (Signed)
Pt was evaluated on 9/29 by Dr. Joseph Art

## 2014-05-31 ENCOUNTER — Telehealth: Payer: Self-pay

## 2014-05-31 MED ORDER — ALPRAZOLAM 0.5 MG PO TABS: 0.5000 mg | ORAL_TABLET | Freq: Every evening | ORAL | Status: DC | PRN

## 2014-05-31 NOTE — Telephone Encounter (Signed)
Spoke

## 2014-05-31 NOTE — Telephone Encounter (Signed)
PA needed for Alprazolam ER. Contacted pharmacist who stated that it needs a PA due to it being the ER version. Since pt has not tried the preferred IM rel alprazolam the PA will not be approved. Pharm stated that pt p/up and paid cash for it, but he would like a change to IM rel version so ins will cover for next fill. Also discussed change to new Abx for UTI, and to stop the Levaquin. Pt voiced understanding.

## 2014-05-31 NOTE — Telephone Encounter (Signed)
Spoke to pt. He is going to start the new abx sent into the pharmacy. Pt was not in a place he was able to discuss another concern that he felt was private. It is not urgent and will call back as soon as he is able.

## 2014-06-01 LAB — URINE CULTURE: Colony Count: >=100000

## 2014-06-10 ENCOUNTER — Emergency Department (HOSPITAL_COMMUNITY)
Admission: EM | Admit: 2014-06-10 | Discharge: 2014-06-10 | Disposition: A | Discharge status: Home / Self Care | Payer: Medicare HMO | Attending: Emergency Medicine | Admitting: Emergency Medicine

## 2014-06-10 ENCOUNTER — Emergency Department (HOSPITAL_COMMUNITY): Payer: Medicare HMO

## 2014-06-10 ENCOUNTER — Encounter (HOSPITAL_COMMUNITY): Payer: Self-pay | Admitting: Emergency Medicine

## 2014-06-10 DIAGNOSIS — Z79899 Other long term (current) drug therapy: Secondary | ICD-10-CM | POA: Diagnosis not present

## 2014-06-10 DIAGNOSIS — R109 Unspecified abdominal pain: Secondary | ICD-10-CM | POA: Diagnosis present

## 2014-06-10 DIAGNOSIS — Z8719 Personal history of other diseases of the digestive system: Secondary | ICD-10-CM | POA: Diagnosis not present

## 2014-06-10 DIAGNOSIS — R61 Generalized hyperhidrosis: Secondary | ICD-10-CM | POA: Insufficient documentation

## 2014-06-10 DIAGNOSIS — N3001 Acute cystitis with hematuria: Secondary | ICD-10-CM | POA: Diagnosis not present

## 2014-06-10 DIAGNOSIS — R110 Nausea: Secondary | ICD-10-CM | POA: Diagnosis not present

## 2014-06-10 DIAGNOSIS — Z86010 Personal history of colonic polyps: Secondary | ICD-10-CM | POA: Insufficient documentation

## 2014-06-10 DIAGNOSIS — F419 Anxiety disorder, unspecified: Secondary | ICD-10-CM | POA: Diagnosis not present

## 2014-06-10 DIAGNOSIS — Z720 Tobacco use: Secondary | ICD-10-CM | POA: Insufficient documentation

## 2014-06-10 DIAGNOSIS — Z9889 Other specified postprocedural states: Secondary | ICD-10-CM | POA: Diagnosis not present

## 2014-06-10 DIAGNOSIS — I10 Essential (primary) hypertension: Secondary | ICD-10-CM | POA: Insufficient documentation

## 2014-06-10 DIAGNOSIS — G8929 Other chronic pain: Secondary | ICD-10-CM | POA: Insufficient documentation

## 2014-06-10 DIAGNOSIS — Z8639 Personal history of other endocrine, nutritional and metabolic disease: Secondary | ICD-10-CM | POA: Insufficient documentation

## 2014-06-10 DIAGNOSIS — F329 Major depressive disorder, single episode, unspecified: Secondary | ICD-10-CM | POA: Diagnosis not present

## 2014-06-10 LAB — I-STAT CHEM 8, ED
BUN: 18 mg/dL (ref 6–23)
CALCIUM ION: 1.16 mmol/L (ref 1.12–1.23)
CHLORIDE: 103 meq/L (ref 96–112)
Creatinine, Ser: 1 mg/dL (ref 0.50–1.35)
Glucose, Bld: 115 mg/dL — ABNORMAL HIGH (ref 70–99)
HCT: 42 % (ref 39.0–52.0)
Hemoglobin: 14.3 g/dL (ref 13.0–17.0)
Potassium: 3.6 mEq/L — ABNORMAL LOW (ref 3.7–5.3)
Sodium: 138 mEq/L (ref 137–147)
TCO2: 25 mmol/L (ref 0–100)

## 2014-06-10 LAB — CBC WITH DIFFERENTIAL/PLATELET
BASOS ABS: 0 10*3/uL (ref 0.0–0.1)
Basophils Relative: 0 % (ref 0–1)
Eosinophils Absolute: 0.1 10*3/uL (ref 0.0–0.7)
Eosinophils Relative: 1 % (ref 0–5)
HEMATOCRIT: 38.4 % — AB (ref 39.0–52.0)
Hemoglobin: 12.8 g/dL — ABNORMAL LOW (ref 13.0–17.0)
LYMPHS PCT: 15 % (ref 12–46)
Lymphs Abs: 1.2 10*3/uL (ref 0.7–4.0)
MCH: 30.1 pg (ref 26.0–34.0)
MCHC: 33.3 g/dL (ref 30.0–36.0)
MCV: 90.4 fL (ref 78.0–100.0)
MONO ABS: 0.5 10*3/uL (ref 0.1–1.0)
Monocytes Relative: 7 % (ref 3–12)
NEUTROS ABS: 6.1 10*3/uL (ref 1.7–7.7)
Neutrophils Relative %: 77 % (ref 43–77)
Platelets: 355 10*3/uL (ref 150–400)
RBC: 4.25 MIL/uL (ref 4.22–5.81)
RDW: 13.9 % (ref 11.5–15.5)
WBC: 7.9 10*3/uL (ref 4.0–10.5)

## 2014-06-10 LAB — URINE MICROSCOPIC-ADD ON

## 2014-06-10 LAB — URINALYSIS, ROUTINE W REFLEX MICROSCOPIC
Bilirubin Urine: NEGATIVE
GLUCOSE, UA: NEGATIVE mg/dL
Ketones, ur: NEGATIVE mg/dL
Nitrite: NEGATIVE
PH: 6 (ref 5.0–8.0)
Protein, ur: 30 mg/dL — AB
Specific Gravity, Urine: 1.016 (ref 1.005–1.030)
Urobilinogen, UA: 0.2 mg/dL (ref 0.0–1.0)

## 2014-06-10 MED ORDER — SODIUM CHLORIDE 0.9 % IV SOLN
Freq: Once | INTRAVENOUS | Status: AC
  Administered 2014-06-10: 05:00:00 via INTRAVENOUS

## 2014-06-10 MED ORDER — ONDANSETRON HCL 4 MG PO TABS: 4.0000 mg | ORAL_TABLET | Freq: Four times a day (QID) | ORAL | Status: DC

## 2014-06-10 MED ORDER — CIPROFLOXACIN HCL 500 MG PO TABS: 500.0000 mg | ORAL_TABLET | Freq: Two times a day (BID) | ORAL | Status: DC

## 2014-06-10 MED ORDER — KETOROLAC TROMETHAMINE 30 MG/ML IJ SOLN
30.0000 mg | Freq: Once | INTRAMUSCULAR | Status: AC
  Administered 2014-06-10: 30 mg via INTRAVENOUS
  Filled 2014-06-10: qty 1

## 2014-06-10 MED ORDER — HYDROMORPHONE HCL 1 MG/ML IJ SOLN
1.0000 mg | Freq: Once | INTRAMUSCULAR | Status: AC
  Administered 2014-06-10: 1 mg via INTRAVENOUS
  Filled 2014-06-10: qty 1

## 2014-06-10 MED ORDER — ONDANSETRON HCL 4 MG/2ML IJ SOLN
4.0000 mg | Freq: Once | INTRAMUSCULAR | Status: AC
  Administered 2014-06-10: 4 mg via INTRAVENOUS
  Filled 2014-06-10: qty 2

## 2014-06-10 MED ORDER — OXYCODONE-ACETAMINOPHEN 5-325 MG PO TABS: 1.0000 | ORAL_TABLET | Freq: Four times a day (QID) | ORAL | Status: DC | PRN

## 2014-06-10 MED ORDER — DEXTROSE 5 % IV SOLN
1.0000 g | Freq: Once | INTRAVENOUS | Status: AC
  Administered 2014-06-10: 1 g via INTRAVENOUS
  Filled 2014-06-10: qty 10

## 2014-06-10 NOTE — ED Notes (Signed)
Pt arrived to the ED with the complaint of right sided flank pain.  Pt states that pain has been present for two weeks.  Pt has been treated for a UTI.  Pt states that he has had pain in the right flank when he has the urge to urinate.  Pt states that he has been treated for the UTI but the right sided flank pain persists.

## 2014-06-10 NOTE — ED Provider Notes (Signed)
Medical screening examination/treatment/procedure(s) were performed by non-physician practitioner and as supervising physician I was immediately available for consultation/collaboration.   EKG Interpretation None        Merryl Hacker, MD 06/10/14 1238

## 2014-06-10 NOTE — ED Notes (Signed)
Pt ambulates well independently.

## 2014-06-10 NOTE — ED Provider Notes (Signed)
CSN: [id number]     Arrival date & time 06/10/14  0151 History   First MD Initiated Contact with Patient 06/10/14 0247     Chief Complaint  Patient presents with  . Flank Pain     (Consider location/radiation/quality/duration/timing/severity/associated sxs/prior Treatment) HPI Comments: Patient is currently taking his second course of antibiotics for UTI.  He, states, that he still having persistent right flank pain.  The dysuria has improved.  He, states he's had intermittent nausea for the past 2, weeks.  Denies any fever, but states he becomes diaphoretic.  At night.  Patient does not have a history of kidney stones, history of prostate cancer with seed implants, he is followed by urology is currently taking Flomax, or radiation to to pain, and amoxicillin.  Patient is a 53 y.o. male presenting with flank pain. The history is provided by the patient.  Flank Pain This is a new problem. The current episode started 1 to 4 weeks ago. The problem occurs constantly. The problem has been gradually worsening. Associated symptoms include diaphoresis, nausea and urinary symptoms. Pertinent negatives include no chills, coughing, fever or vomiting. Nothing aggravates the symptoms. He has tried nothing for the symptoms. The treatment provided no relief.    Past Medical History  Diagnosis Date  . Hypertension   . Chronic low back pain   . Anxiety   . Hypogonadism male   . Depression   . History of gastric ulcer     as child  . ED (erectile dysfunction)   . History of colon polyps   . Lumbar spine scoliosis   . Wears glasses   . At risk for sleep apnea     STOP-BANG= 4   SENT TO PCP 04-02-2014  . Prostate cancer     Dr Tammi Klippel ( oncologist) and Dr Loel Lofty [facility])   . Incomplete bladder emptying   . Retention of urine, unspecified    Past Surgical History  Procedure Laterality Date  . Prostate biopsy    . Lumbar fusion  04/ 2012    and rod  . Colonoscopy w/ polypectomy  july  2015  . Appendectomy  age 70  . Radioactive seed implant N/A 04/06/2014    Procedure: RADIOACTIVE SEED IMPLANT;  Surgeon: [name], MD;  Location: [facility];  Service: Urology;  Laterality: N/A;  dr portable    Family History  Problem Relation Age of Onset  . Cancer Father     prostate   History  Substance Use Topics  . Smoking status: Current Every Day Smoker -- 10 years    Types: Cigarettes, Cigars  . Smokeless tobacco: Never Used     Comment: currently smokes 2 small cigar per day/  quit cigarettes 2011  . Alcohol Use: Yes     Comment: occasional    Review of Systems  Constitutional: Positive for diaphoresis. Negative for fever and chills.  Respiratory: Negative for cough.   Gastrointestinal: Positive for nausea. Negative for vomiting.  Genitourinary: Positive for flank pain. Negative for dysuria, frequency and hematuria.  Musculoskeletal: Negative for back pain.  All other systems reviewed and are negative.     Allergies  Review of patient's allergies indicates no known allergies.  Home Medications   Prior to Admission medications   Medication Sig Start Date End Date Taking? Authorizing Provider  ALPRAZolam Duanne Moron) 0.5 MG tablet Take 1 tablet (0.5 mg total) by mouth at bedtime as needed for anxiety. 05/31/14  Yes [name], MD  cyclobenzaprine (FLEXERIL) 10 MG tablet Take 10 mg by mouth 3 (three) times daily as needed for muscle spasms.   Yes Historical Provider, MD  enalapril-hydrochlorothiazide (VASERETIC) 10-25 MG per tablet Take 1 tablet by mouth every morning. 05/24/14  Yes Chelle S Jeffery, PA-C  gabapentin (NEURONTIN) 300 MG capsule Take 300 mg by mouth 3 (three) times daily.   Yes Historical Provider, MD  HYDROcodone-acetaminophen (NORCO) 10-325 MG per tablet Take 1-2 tablets by mouth every 4 (four) hours as needed for moderate pain. Maximum dose per 24 hours - 8 pills 05/08/14  Yes Thao P Le, DO  ibuprofen (ADVIL,MOTRIN) 800 MG tablet Take  800 mg by mouth 3 (three) times daily with meals.   Yes Historical Provider, MD  OVER THE COUNTER MEDICATION Take 1 tablet by mouth daily. OTC for prostate health   Yes Historical Provider, MD  oxybutynin (DITROPAN XL) 10 MG 24 hr tablet Take 1 tablet (10 mg total) by mouth at bedtime. 05/08/14  Yes Orpah Greek, MD  phenazopyridine (PYRIDIUM) 200 MG tablet Take 1 tablet (200 mg total) by mouth 3 (three) times daily as needed for pain. 05/29/14  Yes [name], MD  sertraline (ZOLOFT) 50 MG tablet Take 50 mg by mouth every morning.    Yes Historical Provider, MD  tamsulosin (FLOMAX) 0.4 MG CAPS capsule Take 1 capsule (0.4 mg total) by mouth daily. 05/08/14  Yes Thao P Le, DO  traZODone (DESYREL) 100 MG tablet Take 100 mg by mouth at bedtime.   Yes Historical Provider, MD  ciprofloxacin (CIPRO) 500 MG tablet Take 1 tablet (500 mg total) by mouth 2 (two) times daily. One po bid x 7 days 06/10/14   Carrie Mew, PA-C  ondansetron (ZOFRAN) 4 MG tablet Take 1 tablet (4 mg total) by mouth every 6 (six) hours. 06/10/14   Carrie Mew, PA-C  oxyCODONE-acetaminophen (PERCOCET) 5-325 MG per tablet Take 1-2 tablets by mouth every 6 (six) hours as needed. 06/10/14   Carrie Mew, PA-C   BP 115/65  Pulse 73  Temp(Src) 98.2 F (36.8 C) (Oral)  Resp 18  Wt 198 lb 3.2 oz (89.903 kg)  SpO2 100% Physical Exam  Nursing note and vitals reviewed. Constitutional: He is oriented to person, place, and time. He appears well-developed and well-nourished.  HENT:  Head: Normocephalic.  Eyes: Pupils are equal, round, and reactive to light.  Neck: Normal range of motion.  Cardiovascular: Normal rate and regular rhythm.   Pulmonary/Chest: Effort normal and breath sounds normal.  Abdominal: Soft. Bowel sounds are normal. He exhibits no distension. There is no tenderness.  Musculoskeletal: Normal range of motion. He exhibits no edema and no tenderness.  Neurological: He is alert and oriented to person,  place, and time.  Skin: Skin is warm.    ED Course  Procedures (including critical care time) Labs Review Labs Reviewed  URINALYSIS, ROUTINE W REFLEX MICROSCOPIC - Abnormal; Notable for the following:    APPearance CLOUDY (*)    Hgb urine dipstick LARGE (*)    Protein, ur 30 (*)    Leukocytes, UA SMALL (*)    All other components within normal limits  CBC WITH DIFFERENTIAL - Abnormal; Notable for the following:    Hemoglobin 12.8 (*)    HCT 38.4 (*)    All other components within normal limits  URINE MICROSCOPIC-ADD ON - Abnormal; Notable for the following:    Bacteria, UA MANY (*)    All other components within normal limits  I-STAT CHEM 8, ED - Abnormal; Notable for the following:    Potassium 3.6 (*)    Glucose, Bld 115 (*)    All other components within normal limits  URINE CULTURE  I-STAT TROPOININ, ED    Imaging Review Ct Renal Stone Study  06/10/2014   CLINICAL DATA:  RIGHT flank pain for 2 weeks. Recent treatment for urinary tract infection. History of appendectomy.  EXAM: CT ABDOMEN AND PELVIS WITHOUT CONTRAST  TECHNIQUE: Multidetector CT imaging of the abdomen and pelvis was performed following the standard protocol without IV contrast.  COMPARISON:  None.  FINDINGS: LUNG BASES: Included view of the lung bases are clear. The visualized heart and pericardium are unremarkable.  KIDNEYS/BLADDER: Kidneys are orthotopic, demonstrating normal size and morphology. Mild RIGHT hydroureteronephrosis with RIGHT perinephric stranding. No urolithiasis. Circumferential urinary bladder wall thickening up to 10 mm with bilateral small ureteroceles.  SOLID ORGANS: The liver, spleen, gallbladder, pancreas and adrenal glands are unremarkable for this non-contrast examination.  GASTROINTESTINAL TRACT: The stomach, small and large bowel are normal status post appendectomy. Normal appendix.  PERITONEUM/RETROPERITONEUM: No intraperitoneal free fluid nor free air. Aortoiliac vessels are normal in  course and caliber, mild calcific atherosclerosis. No lymphadenopathy by CT size criteria. Prostate brachytherapy seeds.  SOFT TISSUES/ OSSEOUS STRUCTURES: Nonsuspicious. Bridging sacroiliac osteophytes. L4-5 and L5-S1 PLIF, with solid interbody arthrodesis.  IMPRESSION: Mild RIGHT hydroureteronephrosis with circumferential urinary bladder wall thickening suggesting chronic outlet obstruction, and potential reflux. Small bilateral ureteroceles. Superimposed RIGHT urinary tract infections suspected. No urolithiasis.  Prostate brachytherapy seeds.   Electronically Signed   By: Elon Alas   On: 06/10/2014 06:56     EKG Interpretation None      MDM   Final diagnoses:  Right flank pain  Acute cystitis with hematuria         Garald Balding, NP 06/10/14 2146

## 2014-06-10 NOTE — Discharge Instructions (Signed)
Followup with urology on Monday. Return to the ER if you develop any severe pain, nausea, vomiting, high fever above 100.64F.  Flank Pain Flank pain refers to pain that is located on the side of the body between the upper abdomen and the back. The pain may occur over a short period of time (acute) or may be long-term or reoccurring (chronic). It may be mild or severe. Flank pain can be caused by many things. CAUSES  Some of the more common causes of flank pain include:  Muscle strains.   Muscle spasms.   A disease of your spine (vertebral disk disease).   A lung infection (pneumonia).   Fluid around your lungs (pulmonary edema).   A kidney infection.   Kidney stones.   A very painful skin rash caused by the chickenpox virus (shingles).   Gallbladder disease.  [place] care will depend on the cause of your pain. In general,  Rest as directed by your caregiver.  Drink enough fluids to keep your urine clear or pale yellow.  Only take over-the-counter or prescription medicines as directed by your caregiver. Some medicines may help relieve the pain.  Tell your caregiver about any changes in your pain.  Follow up with your caregiver as directed. SEEK IMMEDIATE MEDICAL CARE IF:   Your pain is not controlled with medicine.   You have new or worsening symptoms.  Your pain increases.   You have abdominal pain.   You have shortness of breath.   You have persistent nausea or vomiting.   You have swelling in your abdomen.   You feel faint or pass out.   You have blood in your urine.  You have a fever or persistent symptoms for more than 2-3 days.  You have a fever and your symptoms suddenly get worse. MAKE SURE YOU:   Understand these instructions.  Will watch your condition.  Will get help right away if you are not doing well or get worse. Document Released: 10/08/2005 Document Revised: 05/11/2012 Document Reviewed:  03/31/2012 [facility] Patient Information 2015 [place], [place]. This information is not intended to replace advice given to you by your health care provider. Make sure you discuss any questions you have with your health care provider.  Urinary Tract Infection Urinary tract infections (UTIs) can develop anywhere along your urinary tract. Your urinary tract is your body's drainage system for removing wastes and extra water. Your urinary tract includes two kidneys, two ureters, a bladder, and a urethra. Your kidneys are a pair of bean-shaped organs. Each kidney is about the size of your fist. They are located below your ribs, one on each side of your spine. CAUSES Infections are caused by microbes, which are microscopic organisms, including fungi, viruses, and bacteria. These organisms are so small that they can only be seen through a microscope. Bacteria are the microbes that most commonly cause UTIs. SYMPTOMS  Symptoms of UTIs may vary by age and gender of the patient and by the location of the infection. Symptoms in young women typically include a frequent and intense urge to urinate and a painful, burning feeling in the bladder or urethra during urination. Older women and men are more likely to be tired, shaky, and weak and have muscle aches and abdominal pain. A fever may mean the infection is in your kidneys. Other symptoms of a kidney infection include pain in your back or sides below the ribs, nausea, and vomiting. DIAGNOSIS To diagnose a UTI, your caregiver will  ask you about your symptoms. Your caregiver also will ask to provide a urine sample. The urine sample will be tested for bacteria and white blood cells. White blood cells are made by your body to help fight infection. TREATMENT  Typically, UTIs can be treated with medication. Because most UTIs are caused by a bacterial infection, they usually can be treated with the use of antibiotics. The choice of antibiotic and length of treatment depend  on your symptoms and the type of bacteria causing your infection. HOME CARE INSTRUCTIONS  If you were prescribed antibiotics, take them exactly as your caregiver instructs you. Finish the medication even if you feel better after you have only taken some of the medication.  Drink enough water and fluids to keep your urine clear or pale yellow.  Avoid caffeine, tea, and carbonated beverages. They tend to irritate your bladder.  Empty your bladder often. Avoid holding urine for long periods of time.  Empty your bladder before and after sexual intercourse.  After a bowel movement, women should cleanse from front to back. Use each tissue only once. SEEK MEDICAL CARE IF:   You have back pain.  You develop a fever.  Your symptoms do not begin to resolve within 3 days. SEEK IMMEDIATE MEDICAL CARE IF:   You have severe back pain or lower abdominal pain.  You develop chills.  You have nausea or vomiting.  You have continued burning or discomfort with urination. MAKE SURE YOU:   Understand these instructions.  Will watch your condition.  Will get help right away if you are not doing well or get worse. Document Released: 05/27/2005 Document Revised: 02/16/2012 Document Reviewed: 09/25/2011 [facility] Patient Information 2015 [place], [place]. This information is not intended to replace advice given to you by your health care provider. Make sure you discuss any questions you have with your health care provider.

## 2014-06-10 NOTE — ED Provider Notes (Signed)
Patient signed out to me by Florene Glen, NP-C.   Patient is a 53 year old male with past medical history of prostate cancer, recurrent urinary tract infections who presented with flank pain and dysuria. Patient denies history of kidney stones, workup included in labs, symptomatic therapy, CT abdomen and pelvis without contrast for workup of the eye low nephritis versus nephrolithiasis. on my exam the patient, he stated that his pain/nausea is returning. Patient has been treated with 2 mg of Dilaudid since pain here, we will treat with Toradol, and Zofran.   Plan: Followup on CT results and discharge with symptomatic therapy and followup with urology.   CT abdomen pelvis results with impression: Mild RIGHT hydroureteronephrosis with circumferential urinary bladder wall thickening suggesting chronic outlet obstruction, and potential reflux. Small bilateral ureteroceles. Superimposed RIGHT urinary tract infections suspected. No urolithiasis. Prostate brachytherapy seeds.  Patient's urine still significant for a urinary tract infection. We will treat patient with abx, symptomatic therapy, and have him follow up with Dr. Karsten Ro patient states is his urologist. I discussed strict return precautions with patient, and encourage him to call or return to the ER should he have any questions or concerns.  BP 115/65  Pulse 73  Temp(Src) 98.2 F (36.8 C) (Oral)  Resp 18  Wt 198 lb 3.2 oz (89.903 kg)  SpO2 100%  Signed,  Dahlia Bailiff, PA-C 9:02 AM   Carrie Mew, PA-C 06/10/14 1057

## 2014-06-11 NOTE — ED Provider Notes (Signed)
Medical screening examination/treatment/procedure(s) were performed by non-physician practitioner and as supervising physician I was immediately available for consultation/collaboration.   EKG Interpretation None        Merryl Hacker, MD 06/11/14 1941

## 2014-06-12 ENCOUNTER — Encounter (HOSPITAL_COMMUNITY): Payer: Self-pay | Admitting: Emergency Medicine

## 2014-06-12 ENCOUNTER — Emergency Department (HOSPITAL_COMMUNITY): Payer: Medicare HMO

## 2014-06-12 ENCOUNTER — Ambulatory Visit (INDEPENDENT_AMBULATORY_CARE_PROVIDER_SITE_OTHER): Payer: Medicare HMO | Admitting: Family Medicine

## 2014-06-12 ENCOUNTER — Ambulatory Visit (INDEPENDENT_AMBULATORY_CARE_PROVIDER_SITE_OTHER): Payer: Medicare HMO

## 2014-06-12 ENCOUNTER — Emergency Department (HOSPITAL_COMMUNITY)
Admission: EM | Admit: 2014-06-12 | Discharge: 2014-06-12 | Disposition: A | Discharge status: Home / Self Care | Payer: Medicare HMO | Attending: Emergency Medicine | Admitting: Emergency Medicine

## 2014-06-12 VITALS — BP 150/80 | HR 104 | Temp 98.6°F | Resp 18 | Ht 71.0 in | Wt 200.4 lb

## 2014-06-12 DIAGNOSIS — Z8639 Personal history of other endocrine, nutritional and metabolic disease: Secondary | ICD-10-CM | POA: Insufficient documentation

## 2014-06-12 DIAGNOSIS — R059 Cough, unspecified: Secondary | ICD-10-CM

## 2014-06-12 DIAGNOSIS — Z8546 Personal history of malignant neoplasm of prostate: Secondary | ICD-10-CM | POA: Diagnosis not present

## 2014-06-12 DIAGNOSIS — R109 Unspecified abdominal pain: Secondary | ICD-10-CM | POA: Diagnosis present

## 2014-06-12 DIAGNOSIS — F419 Anxiety disorder, unspecified: Secondary | ICD-10-CM | POA: Diagnosis not present

## 2014-06-12 DIAGNOSIS — R05 Cough: Secondary | ICD-10-CM

## 2014-06-12 DIAGNOSIS — N3289 Other specified disorders of bladder: Secondary | ICD-10-CM

## 2014-06-12 DIAGNOSIS — F329 Major depressive disorder, single episode, unspecified: Secondary | ICD-10-CM | POA: Diagnosis not present

## 2014-06-12 DIAGNOSIS — G8929 Other chronic pain: Secondary | ICD-10-CM | POA: Insufficient documentation

## 2014-06-12 DIAGNOSIS — N3091 Cystitis, unspecified with hematuria: Secondary | ICD-10-CM | POA: Insufficient documentation

## 2014-06-12 DIAGNOSIS — Z720 Tobacco use: Secondary | ICD-10-CM | POA: Diagnosis not present

## 2014-06-12 DIAGNOSIS — Z86010 Personal history of colonic polyps: Secondary | ICD-10-CM | POA: Diagnosis not present

## 2014-06-12 DIAGNOSIS — I10 Essential (primary) hypertension: Secondary | ICD-10-CM | POA: Diagnosis not present

## 2014-06-12 DIAGNOSIS — Z8739 Personal history of other diseases of the musculoskeletal system and connective tissue: Secondary | ICD-10-CM | POA: Insufficient documentation

## 2014-06-12 DIAGNOSIS — R300 Dysuria: Secondary | ICD-10-CM

## 2014-06-12 DIAGNOSIS — R319 Hematuria, unspecified: Secondary | ICD-10-CM

## 2014-06-12 DIAGNOSIS — Z79899 Other long term (current) drug therapy: Secondary | ICD-10-CM | POA: Diagnosis not present

## 2014-06-12 DIAGNOSIS — R1011 Right upper quadrant pain: Secondary | ICD-10-CM | POA: Insufficient documentation

## 2014-06-12 DIAGNOSIS — N3090 Cystitis, unspecified without hematuria: Secondary | ICD-10-CM

## 2014-06-12 LAB — POCT CBC
Granulocyte percent: 78 %G (ref 37–80)
HCT, POC: 41.1 % — AB (ref 43.5–53.7)
Hemoglobin: 12.9 g/dL — AB (ref 14.1–18.1)
Lymph, poc: 1.5 (ref 0.6–3.4)
MCH, POC: 29.9 pg (ref 27–31.2)
MCHC: 31.4 g/dL — AB (ref 31.8–35.4)
MCV: 95.4 fL (ref 80–97)
MID (cbc): 0.7 (ref 0–0.9)
MPV: 6.7 fL (ref 0–99.8)
POC Granulocyte: 7.7 — AB (ref 2–6.9)
POC LYMPH PERCENT: 15.1 %L (ref 10–50)
POC MID %: 6.9 %M (ref 0–12)
Platelet Count, POC: 348 10*3/uL (ref 142–424)
RBC: 4.3 M/uL — AB (ref 4.69–6.13)
RDW, POC: 15.9 %
WBC: 9.9 10*3/uL (ref 4.6–10.2)

## 2014-06-12 LAB — COMPREHENSIVE METABOLIC PANEL
ALT: 13 U/L (ref 0–53)
AST: 15 U/L (ref 0–37)
Albumin: 3.7 g/dL (ref 3.5–5.2)
Alkaline Phosphatase: 59 U/L (ref 39–117)
BUN: 22 mg/dL (ref 6–23)
CO2: 27 mEq/L (ref 19–32)
Calcium: 9.2 mg/dL (ref 8.4–10.5)
Chloride: 102 mEq/L (ref 96–112)
Creat: 1.28 mg/dL (ref 0.50–1.35)
Glucose, Bld: 96 mg/dL (ref 70–99)
Potassium: 4.9 mEq/L (ref 3.5–5.3)
Sodium: 139 mEq/L (ref 135–145)
Total Bilirubin: 0.4 mg/dL (ref 0.2–1.2)
Total Protein: 6.2 g/dL (ref 6.0–8.3)

## 2014-06-12 LAB — POCT URINALYSIS DIPSTICK
Bilirubin, UA: NEGATIVE
Glucose, UA: NEGATIVE
Ketones, UA: NEGATIVE
Nitrite, UA: NEGATIVE
Protein, UA: 30
SPEC GRAV UA: 1.02
UROBILINOGEN UA: 0.2
pH, UA: 5.5

## 2014-06-12 LAB — POCT UA - MICROSCOPIC ONLY
Bacteria, U Microscopic: NEGATIVE
CASTS, UR, LPF, POC: NEGATIVE
Crystals, Ur, HPF, POC: NEGATIVE
MUCUS UA: NEGATIVE
Yeast, UA: NEGATIVE

## 2014-06-12 LAB — URINE CULTURE
COLONY COUNT: NO GROWTH
Culture: NO GROWTH

## 2014-06-12 LAB — GLUCOSE, POCT (MANUAL RESULT ENTRY): POC Glucose: 105 mg/dl — AB (ref 70–99)

## 2014-06-12 MED ORDER — SULFAMETHOXAZOLE-TRIMETHOPRIM 800-160 MG PO TABS: 1.0000 | ORAL_TABLET | Freq: Two times a day (BID) | ORAL | Status: DC

## 2014-06-12 MED ORDER — NAPROXEN 500 MG PO TABS: 500.0000 mg | ORAL_TABLET | Freq: Two times a day (BID) | ORAL | Status: DC

## 2014-06-12 MED ORDER — SODIUM CHLORIDE 0.9 % IV BOLUS (SEPSIS)
500.0000 mL | Freq: Once | INTRAVENOUS | Status: AC
  Administered 2014-06-12: 500 mL via INTRAVENOUS

## 2014-06-12 MED ORDER — FENTANYL CITRATE 0.05 MG/ML IJ SOLN
100.0000 ug | Freq: Once | INTRAMUSCULAR | Status: AC
  Administered 2014-06-12: 100 ug via INTRAVENOUS
  Filled 2014-06-12: qty 2

## 2014-06-12 MED ORDER — IOHEXOL 300 MG/ML  SOLN
100.0000 mL | Freq: Once | INTRAMUSCULAR | Status: AC | PRN
  Administered 2014-06-12: 100 mL via INTRAVENOUS

## 2014-06-12 MED ORDER — OXYCODONE-ACETAMINOPHEN 5-325 MG PO TABS: 1.0000 | ORAL_TABLET | Freq: Four times a day (QID) | ORAL | Status: DC | PRN

## 2014-06-12 NOTE — ED Notes (Signed)
AVS explained in detail. Knows not to drink/drive/operate heavy machinery with prescribed medications. Leg bag applied for patient. Knows to follow up with urology. No other questions/complaints.

## 2014-06-12 NOTE — ED Notes (Signed)
Pt being sent by [facility].  C/o flank pain, chest pain, and hematuria x "over a week."  CBC and UA performed at urgent care.

## 2014-06-12 NOTE — ED Notes (Signed)
Patient transported to CT 

## 2014-06-12 NOTE — ED Notes (Signed)
Pt reports that he was seen at [facility] x 2 days ago from same and diagnosed w/ cystitis and hematuria.  Pt reports that he tried to follow up w/ urology, but they tried to schedule him w/ a PA and he refused.  Hx of prostate CA.  Pt reports having radiation seeds planted in August and "has gone downhill since."  Sts he was prescribed Cipro x 2 days ago, but his Oncologist directed him not to take it.

## 2014-06-12 NOTE — ED Provider Notes (Addendum)
FINDINGS:  There is subsegmental atelectasis in each posterior lung base.  Liver is prominent measuring 16 cm in length. No focal liver lesions  are identified. Gallbladder wall is not thickened. There is no  biliary duct dilatation.  Spleen, pancreas, adrenals appear normal. Left kidney shows no  evidence of mass, calculus, or hydronephrosis. There is no  left-sided ureteral calculus.  On the right, there is fluid tracking throughout the renal pelvis  and into the surrounding perinephric fascia. There is moderate  perinephric stranding on the right. Fluid tracks along the course of  the right ureter to the level of the urinary bladder. No calculus is  seen in the right ureter.  There is a Foley catheter in the pelvis, there is a Foley catheter  within the urinary bladder. There is marked thickening of the  urinary bladder wall with urinary bladder wall irregularity noted.  Contrast flows into the urinary bladder from each ureter. Note that  there are small posterior urinary bladder diverticula adjacent to  the respective ureters. The diverticulum on the right measures 2.1 x  1.7 cm. The diverticulum on the left measures 1.3 x 1.3 cm. There  are seed implants throughout the prostate. There is no pelvic mass  apart from the urinary bladder. There are no fluid collections  within the pelvis.  Appendix is absent.  There is no bowel obstruction. No free air or portal venous air.  There is no ascites, adenopathy, or abscess in the abdomen or  pelvis. There is atherosclerotic change in the aorta but no  aneurysm. There is postoperative change in the lumbar spine at L4-5  and L5-S1. There are no blastic or lytic bone lesions appreciable.  IMPRESSION:  There is marked thickening of the urinary bladder wall with urinary  bladder wall irregularity. There are small urinary bladder or  diverticula near the insertion sites for each ureter. Note that  there are seed implants in the prostate.  Question marked cystitis  from the adjacent seed implants in the prostate versus neoplastic  involvement of the urinary bladder. Both entities may exist  concurrently. Direct visualization the urinary bladder is felt to be  warranted in this circumstance.  There has been forniceal/caliceal rupture in the right kidney with  fluid tracking from the right renal pelvis in to the surrounding  perinephric fascia along the course of the right ureter. There is no  appreciable dilatation of the renal collecting system on this study  with contrast delineating the renal collecting system currently.  There is mild generalized prominence of the right ureter which may  be due to extrinsic compression of the distal right ureter due to  the marked thickening of the urinary bladder wall in this area. No  calculus seen.  Appendix absent. No bowel obstruction. No abscess.  Extensive postoperative change in the lumbar spine.  Liver prominent without focal lesion.  Electronically Signed  By: Lowella Grip M.D.  On: 06/12/2014 17:16   Patient returned from CT and findings discussed with Dr. Louis Meckel. He felt pt needed close follow up with Dr. Loel Lofty but no intervention tonight.  Patient will be started on Bactrim and naproxen. The Foley catheter will remain in and he will follow up with Dr. Loel Lofty by calling the office tomorrow. Patient and family member who is present. Understanding and agree with the plan. They're comfortable going home  Blanchie Dessert, MD 06/12/14 [place], MD 06/12/14 [id number]

## 2014-06-12 NOTE — ED Provider Notes (Signed)
CSN: [id number]     Arrival date & time 06/12/14  1222 History   First MD Initiated Contact with Patient 06/12/14 1324     Chief Complaint  Patient presents with  . Flank Pain  . Hematuria     (Consider location/radiation/quality/duration/timing/severity/associated sxs/prior Treatment) Patient is a 53 y.o. male presenting with flank pain and hematuria. The history is provided by the patient.  Flank Pain This is a recurrent problem. Associated symptoms include abdominal pain. Pertinent negatives include no chest pain, no headaches and no shortness of breath.  Hematuria Associated symptoms include abdominal pain. Pertinent negatives include no chest pain, no headaches and no shortness of breath.   patient presents with right flank pain. He has had a complicated urologic treatment recently. He had radioactive beads placed for prostate cancer the beginning of August since then he has had issues with retention and needing catheters. He reportedly had that one pulled out with the balloon up. He has had possible infection and has been on antibiotics. He has had a positive culture for staph aureus. He is CT scan 2 days ago that showed hydronephrosis likely from obstruction. Possible infection. Urinalysis did not show clearly show infection. Patient had urologic followup, but was going to be with a PA so he did not want to go and went to his primary care doctor instead. Primary care is sent in to the ER to rule out renal infarct or renal vein thrombosis. Patient states the pain is worse with urination  Past Medical History  Diagnosis Date  . Hypertension   . Chronic low back pain   . Anxiety   . Hypogonadism male   . Depression   . History of gastric ulcer     as child  . ED (erectile dysfunction)   . History of colon polyps   . Lumbar spine scoliosis   . Wears glasses   . At risk for sleep apnea     STOP-BANG= 4   SENT TO PCP 04-02-2014  . Prostate cancer     Dr Tammi Klippel ( oncologist) and  Dr Loel Lofty [facility])   . Incomplete bladder emptying   . Retention of urine, unspecified    Past Surgical History  Procedure Laterality Date  . Prostate biopsy    . Lumbar fusion  04/ 2012    and rod  . Colonoscopy w/ polypectomy  july 2015  . Appendectomy  age 10  . Radioactive seed implant N/A 04/06/2014    Procedure: RADIOACTIVE SEED IMPLANT;  Surgeon: [name], MD;  Location: [facility];  Service: Urology;  Laterality: N/A;  dr portable    Family History  Problem Relation Age of Onset  . Cancer Father     prostate   History  Substance Use Topics  . Smoking status: Current Every Day Smoker -- 10 years    Types: Cigarettes, Cigars  . Smokeless tobacco: Never Used     Comment: currently smokes 2 small cigar per day/  quit cigarettes 2011  . Alcohol Use: Yes     Comment: occasional    Review of Systems  Constitutional: Negative for activity change and appetite change.  Eyes: Negative for pain.  Respiratory: Negative for chest tightness and shortness of breath.   Cardiovascular: Negative for chest pain and leg swelling.  Gastrointestinal: Positive for abdominal pain. Negative for nausea, vomiting and diarrhea.  Genitourinary: Positive for hematuria and flank pain.  Musculoskeletal: Negative for back pain and neck stiffness.  Skin:  Negative for rash.  Neurological: Negative for weakness, numbness and headaches.  Psychiatric/Behavioral: Negative for behavioral problems.      Allergies  Review of patient's allergies indicates no known allergies.  Home Medications   Prior to Admission medications   Medication Sig Start Date End Date Taking? Authorizing Provider  ALPRAZolam (XANAX XR) 0.5 MG 24 hr tablet Take 0.5 mg by mouth daily as needed for anxiety.   Yes Historical Provider, MD  ciprofloxacin (CIPRO) 500 MG tablet Take 1 tablet (500 mg total) by mouth 2 (two) times daily. One po bid x 7 days 06/10/14  Yes Carrie Mew, PA-C   cyclobenzaprine (FLEXERIL) 10 MG tablet Take 10 mg by mouth 3 (three) times daily as needed for muscle spasms.   Yes Historical Provider, MD  enalapril-hydrochlorothiazide (VASERETIC) 10-25 MG per tablet Take 1 tablet by mouth every morning. 05/24/14  Yes Chelle S Jeffery, PA-C  gabapentin (NEURONTIN) 300 MG capsule Take 300 mg by mouth 3 (three) times daily.   Yes Historical Provider, MD  ibuprofen (ADVIL,MOTRIN) 800 MG tablet Take 800 mg by mouth 3 (three) times daily with meals.   Yes Historical Provider, MD  Nutritional Supplements (NUTRITIONAL SUPPLEMENT PLUS PO) Take 1 capsule by mouth daily. Gets at health food store unsure of name of medication   Yes Historical Provider, MD  oxyCODONE-acetaminophen (PERCOCET) 5-325 MG per tablet Take 1-2 tablets by mouth every 6 (six) hours as needed. 06/10/14  Yes Carrie Mew, PA-C  tamsulosin (FLOMAX) 0.4 MG CAPS capsule Take 0.8 mg by mouth at bedtime.   Yes Historical Provider, MD  traZODone (DESYREL) 100 MG tablet Take 100 mg by mouth at bedtime.   Yes Historical Provider, MD  zolpidem (AMBIEN) 10 MG tablet Take 10 mg by mouth at bedtime as needed for sleep.   Yes Historical Provider, MD  ondansetron (ZOFRAN) 4 MG tablet Take 1 tablet (4 mg total) by mouth every 6 (six) hours. 06/10/14   Carrie Mew, PA-C   BP 152/73  Pulse 89  Temp(Src) 99.5 F (37.5 C) (Oral)  Resp 20  SpO2 100% Physical Exam  Nursing note and vitals reviewed. Constitutional: He is oriented to person, place, and time. He appears well-developed and well-nourished.  HENT:  Head: Normocephalic and atraumatic.  Eyes: EOM are normal. Pupils are equal, round, and reactive to light.  Neck: Normal range of motion. Neck supple.  Cardiovascular: Normal rate, regular rhythm and normal heart sounds.   No murmur heard. Pulmonary/Chest: Effort normal and breath sounds normal.  Abdominal: Soft. Bowel sounds are normal. He exhibits no distension and no mass. There is tenderness.  There is no rebound and no guarding.  Mild right upper quadrant tenderness without rebound guarding.  Genitourinary:  Mild CVA tenderness on right  Musculoskeletal: Normal range of motion. He exhibits no edema.  Neurological: He is alert and oriented to person, place, and time. No cranial nerve deficit.  Skin: Skin is warm and dry.  Psychiatric: He has a normal mood and affect.    ED Course  Procedures (including critical care time) Labs Review Labs Reviewed  URINE CULTURE    Imaging Review Dg Chest 2 View  06/12/2014   CLINICAL DATA:  Right flank pain and dysuria.  Cough and chills.  EXAM: CHEST  2 VIEW  COMPARISON:  01/31/2014  FINDINGS: Heart size and pulmonary vascularity are normal and the lungs are clear except for slight atelectasis at the right lung base and slight thickening of the minor fissure  on the right. No osseous abnormality.  IMPRESSION: Minimal atelectasis at the right base.   Electronically Signed   By: Rozetta Nunnery M.D.   On: 06/12/2014 13:11     EKG Interpretation None      MDM   Final diagnoses:  None    Patient with right flank pain has had hematuria. Question of infection. CT scan 2 days ago showed hydronephrosis on the right, and some bladder thickening. Discussed with Dr. Louis Meckel from urology. Felt most likely reflux from bladder outlet obstruction. After urination patient still did have a fair amount of urine the bladder. Foley catheter replaced. Dr. Louis Meckel had recommended steroids and Flomax. Urine culture from yesterday is negative. We'll get CT scan with contrast to evaluate renal vasculature and kidney Maney discharge with steroids and possibly antibiotics. Follow up with Dr Karsten Ro.    Jasper Riling. Alvino Chapel, [place] 06/12/14 1555

## 2014-06-12 NOTE — Progress Notes (Signed)
This chart was scribed for Justin Haber, MD by Terressa Koyanagi, ED Scribe at [facility]. This patient was seen in room Room 13 and the patient's care was started at 11:15 AM.  Patient ID: Justin Dunn MRN: [id number], DOB: 24-Oct-1960, 53 y.o. Date of Encounter: 06/12/2014, 11:15 AM  Primary Physician: Vincenza Hews, NP  Chief Complaint  Patient presents with  . Dysuria    pt is back with sxs regarding UTI; having lots of pain when urinating  . Chills  . Flank Pain     HPI: 53 y.o. year old male with history below presents with right sided flank pain and dysuria onset over one week ago. Pt also complains of associated weakness, decreased intake PO, cough and chills. Patient denies history of kidney stones. Pt reports he went to the ED 2 days ago for the same.   06/10/14 ED Visit Findings:  Results for orders placed during the hospital encounter of 06/10/14  URINALYSIS, ROUTINE W REFLEX MICROSCOPIC      Result Value Ref Range   Color, Urine YELLOW  YELLOW   APPearance CLOUDY (*) CLEAR   Specific Gravity, Urine 1.016  1.005 - 1.030   pH 6.0  5.0 - 8.0   Glucose, UA NEGATIVE  NEGATIVE mg/dL   Hgb urine dipstick LARGE (*) NEGATIVE   Bilirubin Urine NEGATIVE  NEGATIVE   Ketones, ur NEGATIVE  NEGATIVE mg/dL   Protein, ur 30 (*) NEGATIVE mg/dL   Urobilinogen, UA 0.2  0.0 - 1.0 mg/dL   Nitrite NEGATIVE  NEGATIVE   Leukocytes, UA SMALL (*) NEGATIVE  CBC WITH DIFFERENTIAL      Result Value Ref Range   WBC 7.9  4.0 - 10.5 K/uL   RBC 4.25  4.22 - 5.81 MIL/uL   Hemoglobin 12.8 (*) 13.0 - 17.0 g/dL   HCT 38.4 (*) 39.0 - 52.0 %   MCV 90.4  78.0 - 100.0 fL   MCH 30.1  26.0 - 34.0 pg   MCHC 33.3  30.0 - 36.0 g/dL   RDW 13.9  11.5 - 15.5 %   Platelets 355  150 - 400 K/uL   Neutrophils Relative % 77  43 - 77 %   Neutro Abs 6.1  1.7 - 7.7 K/uL   Lymphocytes Relative 15  12 - 46 %   Lymphs Abs 1.2  0.7 - 4.0 K/uL   Monocytes Relative 7  3 - 12 %   Monocytes Absolute  0.5  0.1 - 1.0 K/uL   Eosinophils Relative 1  0 - 5 %   Eosinophils Absolute 0.1  0.0 - 0.7 K/uL   Basophils Relative 0  0 - 1 %   Basophils Absolute 0.0  0.0 - 0.1 K/uL  URINE MICROSCOPIC-ADD ON      Result Value Ref Range   WBC, UA 3-6  <3 WBC/hpf   RBC / HPF TOO NUMEROUS TO COUNT  <3 RBC/hpf   Bacteria, UA MANY (*) RARE  I-STAT CHEM 8, ED      Result Value Ref Range   Sodium 138  137 - 147 mEq/L   Potassium 3.6 (*) 3.7 - 5.3 mEq/L   Chloride 103  96 - 112 mEq/L   BUN 18  6 - 23 mg/dL   Creatinine, Ser 1.00  0.50 - 1.35 mg/dL   Glucose, Bld 115 (*) 70 - 99 mg/dL   Calcium, Ion 1.16  1.12 - 1.23 mmol/L   TCO2 25  0 - 100  mmol/L   Hemoglobin 14.3  13.0 - 17.0 g/dL   HCT 42.0  39.0 - 52.0 %      Past Medical History  Diagnosis Date  . Hypertension   . Chronic low back pain   . Anxiety   . Hypogonadism male   . Depression   . History of gastric ulcer     as child  . ED (erectile dysfunction)   . History of colon polyps   . Lumbar spine scoliosis   . Wears glasses   . At risk for sleep apnea     STOP-BANG= 4   SENT TO PCP 04-02-2014  . Prostate cancer     Dr Tammi Klippel ( oncologist) and Dr Loel Lofty [facility])   . Incomplete bladder emptying   . Retention of urine, unspecified      Home Meds: Prior to Admission medications   Medication Sig Start Date End Date Taking? Authorizing Provider  ALPRAZolam Duanne Moron) 0.5 MG tablet Take 1 tablet (0.5 mg total) by mouth at bedtime as needed for anxiety. 05/31/14  Yes Justin Haber, MD  ciprofloxacin (CIPRO) 500 MG tablet Take 1 tablet (500 mg total) by mouth 2 (two) times daily. One po bid x 7 days 06/10/14  Yes Carrie Mew, PA-C  enalapril-hydrochlorothiazide (VASERETIC) 10-25 MG per tablet Take 1 tablet by mouth every morning. 05/24/14  Yes Chelle S Jeffery, PA-C  oxyCODONE-acetaminophen (PERCOCET) 5-325 MG per tablet Take 1-2 tablets by mouth every 6 (six) hours as needed. 06/10/14  Yes Carrie Mew, PA-C  tamsulosin  (FLOMAX) 0.4 MG CAPS capsule Take 1 capsule (0.4 mg total) by mouth daily. 05/08/14  Yes Thao P Le, DO  traZODone (DESYREL) 100 MG tablet Take 100 mg by mouth at bedtime.   Yes Historical Provider, MD  zolpidem (AMBIEN) 10 MG tablet Take 10 mg by mouth at bedtime as needed for sleep.   Yes Historical Provider, MD  cyclobenzaprine (FLEXERIL) 10 MG tablet Take 10 mg by mouth 3 (three) times daily as needed for muscle spasms.    Historical Provider, MD  gabapentin (NEURONTIN) 300 MG capsule Take 300 mg by mouth 3 (three) times daily.    Historical Provider, MD  HYDROcodone-acetaminophen (NORCO) 10-325 MG per tablet Take 1-2 tablets by mouth every 4 (four) hours as needed for moderate pain. Maximum dose per 24 hours - 8 pills 05/08/14   Thao P Le, DO  ibuprofen (ADVIL,MOTRIN) 800 MG tablet Take 800 mg by mouth 3 (three) times daily with meals.    Historical Provider, MD  ondansetron (ZOFRAN) 4 MG tablet Take 1 tablet (4 mg total) by mouth every 6 (six) hours. 06/10/14   Carrie Mew, PA-C  OVER THE COUNTER MEDICATION Take 1 tablet by mouth daily. OTC for prostate health    Historical Provider, MD  oxybutynin (DITROPAN XL) 10 MG 24 hr tablet Take 1 tablet (10 mg total) by mouth at bedtime. 05/08/14   Orpah Greek, MD  phenazopyridine (PYRIDIUM) 200 MG tablet Take 1 tablet (200 mg total) by mouth 3 (three) times daily as needed for pain. 05/29/14   Justin Haber, MD  sertraline (ZOLOFT) 50 MG tablet Take 50 mg by mouth every morning.     Historical Provider, MD    Allergies: No Known Allergies  History   Social History  . Marital Status: Divorced    Spouse Name: N/A    Number of Children: N/A  . Years of Education: N/A   Occupational History  .  Not on file.   Social History Main Topics  . Smoking status: Current Every Day Smoker -- 10 years    Types: Cigarettes, Cigars  . Smokeless tobacco: Never Used     Comment: currently smokes 2 small cigar per day/  quit cigarettes 2011  .  Alcohol Use: Yes     Comment: occasional  . Drug Use: No  . Sexual Activity: No   Other Topics Concern  . Not on file   Social History Narrative  . No narrative on file     Review of Systems: Constitutional: negative for fever, night sweats, weight changes, or fatigue. Positive for chills and decreased intake PO.  HEENT: negative for vision changes, hearing loss, congestion, rhinorrhea, ST, epistaxis, or sinus pressure Cardiovascular: negative for chest pain or palpitations Respiratory: negative for hemoptysis, wheezing, shortness of breath. Positive for cough.  Abdominal: negative for abdominal pain, nausea, vomiting, diarrhea, or constipation. Positive for right sided flank pain.  Dermatological: negative for rash Neurologic: negative for headache, dizziness, or syncope All other systems reviewed and are otherwise negative with the exception to those above and in the HPI.   Physical Exam: Obviously in pain and quite discouraged with his clinical course Blood pressure 150/80, pulse 104, temperature 98.6 F (37 C), temperature source Oral, resp. rate 18, height 5\' 11"  (1.803 m), weight 200 lb 6.4 oz (90.901 kg), SpO2 98.00%., Body mass index is 27.96 kg/(m^2). General: Well developed, well nourished, in no acute distress. Head: Normocephalic, atraumatic, eyes without discharge, sclera non-icteric, nares are without discharge. Bilateral auditory canals clear, TM's are without perforation, pearly grey and translucent with reflective cone of light bilaterally. Oral cavity moist, posterior pharynx without exudate, erythema, peritonsillar abscess, or post nasal drip.  Neck: Supple. No thyromegaly. Full ROM. No lymphadenopathy. Lungs: Clear bilaterally to auscultation without wheezes, rales, or rhonchi. Breathing is unlabored. Heart: RRR with S1 S2. No murmurs, rubs, or gallops appreciated. Abdomen: Soft, non-tender, non-distended with normoactive bowel sounds. No hepatomegaly. No  rebound/guarding. No obvious abdominal masses. Msk:  Strength and tone normal for age. Extremities/Skin: Warm and dry. No clubbing or cyanosis. No edema. No rashes or suspicious lesions. Neuro: Alert and oriented X 3. Moves all extremities spontaneously. Gait is normal. CNII-XII grossly in tact. Psych:  Responds to questions appropriately with a normal affect.   Labs: Results for orders placed in visit on 06/12/14  POCT UA - MICROSCOPIC ONLY      Result Value Ref Range   WBC, Ur, HPF, POC 3-9     RBC, urine, microscopic TNTC     Bacteria, U Microscopic neg     Mucus, UA neg     Epithelial cells, urine per micros 0-1     Crystals, Ur, HPF, POC neg     Casts, Ur, LPF, POC neg     Yeast, UA neg    POCT URINALYSIS DIPSTICK      Result Value Ref Range   Color, UA yellow     Clarity, UA clear     Glucose, UA neg     Bilirubin, UA neg     Ketones, UA neg     Spec Grav, UA 1.020     Blood, UA large     pH, UA 5.5     Protein, UA 30     Urobilinogen, UA 0.2     Nitrite, UA neg     Leukocytes, UA Trace    POCT CBC      Result Value Ref  Range   WBC 9.9  4.6 - 10.2 K/uL   Lymph, poc 1.5  0.6 - 3.4   POC LYMPH PERCENT 15.1  10 - 50 %L   MID (cbc) 0.7  0 - 0.9   POC MID % 6.9  0 - 12 %M   POC Granulocyte 7.7 (*) 2 - 6.9   Granulocyte percent 78.0  37 - 80 %G   RBC 4.30 (*) 4.69 - 6.13 M/uL   Hemoglobin 12.9 (*) 14.1 - 18.1 g/dL   HCT, POC 41.1 (*) 43.5 - 53.7 %   MCV 95.4  80 - 97 fL   MCH, POC 29.9  27 - 31.2 pg   MCHC 31.4 (*) 31.8 - 35.4 g/dL   RDW, POC 15.9     Platelet Count, POC 348  142 - 424 K/uL   MPV 6.7  0 - 99.8 fL  GLUCOSE, POCT (MANUAL RESULT ENTRY)      Result Value Ref Range   POC Glucose 105 (*) 70 - 99 mg/dl   [facility] reading (PRIMARY) by  Dr. Joseph Art:  Raised right hemidiaphragm with platelike atelectasis in mid right chest.   ASSESSMENT AND PLAN:  DIAGNOSTIC STUDIES: Oxygen Saturation is 98% on RA, nl by my interpretation.    COORDINATION OF  CARE: 11:27 AM-Discussed treatment plan which includes imaging and labs with pt at bedside and pt agreed to plan.   53 y.o. year old male with worsening cough, worsening appetite, persistent blood in urine, and negative urine culture.  And concerned this patient may have some thrombosis in the right renal artery or perhaps necrosis of part of his right kidney. One would think this would've shown up on the CT scan the other day. Nevertheless patient's pain is uncontrolled, is on a downward course, and we've done everything we can to correct infection or treat the pain.  I believe the patient needs to be hospitalized for further evaluation with review of situation by urologist. Therefore I'm sending him to [facility] long emergency room department.   Signed, Justin Haber, MD 06/12/2014 11:15 AM   I personally performed the services described in this documentation, which was scribed in my presence. The recorded information has been reviewed and is accurate.

## 2014-06-13 LAB — URINE CULTURE
CULTURE: NO GROWTH
Colony Count: NO GROWTH

## 2014-06-18 ENCOUNTER — Telehealth: Payer: Self-pay

## 2014-06-18 NOTE — Telephone Encounter (Signed)
Pt needs to pick up a copy of lab work and office notes for services rendered on 06/12/14. Pt would like to pick up this information today Please call pt at [phone]

## 2014-06-19 ENCOUNTER — Ambulatory Visit (INDEPENDENT_AMBULATORY_CARE_PROVIDER_SITE_OTHER): Payer: Medicare HMO | Admitting: Family Medicine

## 2014-06-19 VITALS — BP 118/88 | HR 101 | Temp 98.4°F | Resp 18 | Ht 70.5 in | Wt 198.4 lb

## 2014-06-19 DIAGNOSIS — G4700 Insomnia, unspecified: Secondary | ICD-10-CM

## 2014-06-19 DIAGNOSIS — F4322 Adjustment disorder with anxiety: Secondary | ICD-10-CM

## 2014-06-19 DIAGNOSIS — C61 Malignant neoplasm of prostate: Secondary | ICD-10-CM

## 2014-06-19 DIAGNOSIS — R319 Hematuria, unspecified: Secondary | ICD-10-CM

## 2014-06-19 DIAGNOSIS — F4323 Adjustment disorder with mixed anxiety and depressed mood: Secondary | ICD-10-CM

## 2014-06-19 MED ORDER — ZOLPIDEM TARTRATE 10 MG PO TABS: 10.0000 mg | ORAL_TABLET | Freq: Every evening | ORAL | Status: DC | PRN

## 2014-06-19 MED ORDER — ALPRAZOLAM ER 0.5 MG PO TB24: 0.5000 mg | ORAL_TABLET | Freq: Two times a day (BID) | ORAL | Status: DC | PRN

## 2014-06-19 MED ORDER — OXYCODONE-ACETAMINOPHEN 5-325 MG PO TABS: 1.0000 | ORAL_TABLET | Freq: Four times a day (QID) | ORAL | Status: DC | PRN

## 2014-06-19 NOTE — Progress Notes (Signed)
53 yo man with prostate cancer under treatment by Dr. Karsten Ro.  He was recently seen at the ED because of polyuria, back pain, hematuria.  They inserted a foley and he passed a large clot.  He still has some burning, but his pain is greatly decreased.  Since the ED, he is better.  Eating better.  He is scheduled to see Dr. Karsten Ro tomorrow at 70.  He needs his Ambien refilled.  He thinks at times he may be having fever because of intermittent chills.  He has night sweats.  Objective:  Alert, cooperative and appropriate HEENT: unremarkable Neck: no thyromegaly, adenopathy, supple Chest:  Clear Heart: reg, no murmur Abdomen:  Soft, nontender with no mass or CVAT, no HSM Right leg bag with foley in place.  Results for orders placed during the hospital encounter of 06/12/14  URINE CULTURE      Result Value Ref Range   Specimen Description URINE, RANDOM     Special Requests NONE     Culture  Setup Time       Value: 06/13/2014 01:27     Performed at [facility]       Value: NO GROWTH     Performed at [facility]   Culture       Value: NO GROWTH     Performed at [facility]   Report Status 06/13/2014 FINAL     CLINICAL DATA: Right flank pain with hematuria, persistent  EXAM:  CT ABDOMEN AND PELVIS WITH CONTRAST  TECHNIQUE:  Multidetector CT imaging of the abdomen and pelvis was performed  using the standard protocol following bolus administration of  intravenous contrast.  CONTRAST: 113mL OMNIPAQUE IOHEXOL 300 MG/ML SOLN  COMPARISON: June 10, 2014  FINDINGS:  There is subsegmental atelectasis in each posterior lung base.  Liver is prominent measuring 16 cm in length. No focal liver lesions  are identified. Gallbladder wall is not thickened. There is no  biliary duct dilatation.  Spleen, pancreas, adrenals appear normal. Left kidney shows no  evidence of mass, calculus, or hydronephrosis. There is no  left-sided ureteral calculus.   On the right, there is fluid tracking throughout the renal pelvis  and into the surrounding perinephric fascia. There is moderate  perinephric stranding on the right. Fluid tracks along the course of  the right ureter to the level of the urinary bladder. No calculus is  seen in the right ureter.  There is a Foley catheter in the pelvis, there is a Foley catheter  within the urinary bladder. There is marked thickening of the  urinary bladder wall with urinary bladder wall irregularity noted.  Contrast flows into the urinary bladder from each ureter. Note that  there are small posterior urinary bladder diverticula adjacent to  the respective ureters. The diverticulum on the right measures 2.1 x  1.7 cm. The diverticulum on the left measures 1.3 x 1.3 cm. There  are seed implants throughout the prostate. There is no pelvic mass  apart from the urinary bladder. There are no fluid collections  within the pelvis.  Appendix is absent.  There is no bowel obstruction. No free air or portal venous air.  There is no ascites, adenopathy, or abscess in the abdomen or  pelvis. There is atherosclerotic change in the aorta but no  aneurysm. There is postoperative change in the lumbar spine at L4-5  and L5-S1. There are no blastic or lytic bone lesions appreciable.  IMPRESSION:  There  is marked thickening of the urinary bladder wall with urinary  bladder wall irregularity. There are small urinary bladder or  diverticula near the insertion sites for each ureter. Note that  there are seed implants in the prostate. Question marked cystitis  from the adjacent seed implants in the prostate versus neoplastic  involvement of the urinary bladder. Both entities may exist  concurrently. Direct visualization the urinary bladder is felt to be  warranted in this circumstance.  There has been forniceal/caliceal rupture in the right kidney with  fluid tracking from the right renal pelvis in to the surrounding   perinephric fascia along the course of the right ureter. There is no  appreciable dilatation of the renal collecting system on this study  with contrast delineating the renal collecting system currently.  There is mild generalized prominence of the right ureter which may  be due to extrinsic compression of the distal right ureter due to  the marked thickening of the urinary bladder wall in this area. No  calculus seen.  Appendix absent. No bowel obstruction. No abscess.  Extensive postoperative change in the lumbar spine.  Liver prominent without focal lesion.  Electronically Signed  By: Lowella Grip M.D.  On: 06/12/2014 17:16    CLINICAL DATA: Right flank pain and dysuria. Cough and chills.  EXAM:  CHEST 2 VIEW  COMPARISON: 01/31/2014  FINDINGS:  Heart size and pulmonary vascularity are normal and the lungs are  clear except for slight atelectasis at the right lung base and  slight thickening of the minor fissure on the right. No osseous  abnormality.  IMPRESSION:  Minimal atelectasis at the right base.  Electronically Signed  By: Rozetta Nunnery M.D.  On: 06/12/2014 13:11   POCT CBC (Order [id number])       POCT CBC  Status: Final result     Visible to patient: This result is not viewable by the patient.     Next appt: None     Dx: Cough; Flank pain                   Ref Range 1wk ago (06/12/14) 1wk ago (06/10/14) 1wk ago (06/10/14) 33mo ago (03/30/14) 42yr ago (07/20/12)     WBC 4.6 - 10.2 K/uL 9.9      7.9 R   6.3 R         Lymph, poc 0.6 - 3.4  1.5                POC LYMPH PERCENT 10 - 50 %L 15.1                MID (cbc) 0 - 0.9  0.7                POC MID % 0 - 12 %M 6.9                POC Granulocyte 2 - 6.9  7.7 (A)                Granulocyte percent 37 - 80 %G 78.0                RBC 4.69 - 6.13 M/uL 4.30 (A)      4.25 R   4.75 R         Hemoglobin 14.1 - 18.1 g/dL 12.9 (A)    14.3 R   12.8 (L) R   14.8 R   16.0 R       HCT, POC  43.5 -  53.7 % 41.1 (A)    42.0 R   38.4 (L) R   43.1 R   47.0 R       MCV 80 - 97 fL 95.4      90.4 R   90.7 R         MCH, POC 27 - 31.2 pg 29.9      30.1 R   31.2 R         MCHC 31.8 - 35.4 g/dL 31.4 (A)      33.3 R   34.3 R         RDW, POC % 15.9                Platelet Count, POC 142 - 424 K/uL 348                MPV 0 - 99.8 fL 6.7               Resulting Agency   [facility]       Notes Recorded by Walden Field on 06/13/2014 at 4:24 PM Gave pt results. Notes Recorded by [name], MD on 06/13/2014 at 10:38 AM Please inform patient of normal result although the hematuria still needs to be explained.        Ref Range 1wk ago (06/12/14) 1wk ago (06/10/14) 3wk ago (05/29/14) 38mo ago (03/30/14) 22yr ago (07/20/12)     Sodium 135 - 145 mEq/L 139    138 R   140    140 R   136        Potassium 3.5 - 5.3 mEq/L 4.9    3.6 (L) R   4.0    4.1 R   4.3 R       Chloride 96 - 112 mEq/L 102    103    104    101    103        CO2 19 - 32 mEq/L 27      25    28           Glucose, Bld 70 - 99 mg/dL 96    115 (H)    88    87    92        BUN 6 - 23 mg/dL 22    18    19    17    16         Creat 0.50 - 1.35 mg/dL 1.28    1.00    1.00    1.10    1.00        Total Bilirubin 0.2 - 1.2 mg/dL 0.4      0.4    0.3 R         Alkaline Phosphatase 39 - 117 U/L 59      60    79          AST 0 - 37 U/L 15      14    24           ALT 0 - 53 U/L 13      14    19           Total Protein 6.0 - 8.3 g/dL 6.2      6.3    7.4          Albumin 3.5 - 5.2 g/dL 3.7      3.8    4.1  Calcium 8.4 - 10.5 mg/dL 9.2      8.9    9.4         Resulting Agency   [facility]          Result Narrative   Assessment:  Patient with a great deal of anxiety who has had recent obstruction from clot in bladder.  Now more comfortable but needs urological follow up.  Plan:  Follow up tomorrow with Dr.  Karsten Ro Prostate cancer - Plan: oxyCODONE-acetaminophen (PERCOCET/ROXICET) 5-325 MG per tablet  Hematuria  Adjustment disorder with mixed anxiety and depressed mood  Insomnia - Plan: zolpidem (AMBIEN) 10 MG tablet  Adjustment disorder with anxious mood - Plan: ALPRAZolam (XANAX XR) 0.5 MG 24 hr tablet Continue the Trazodone  Signed, [name], MD

## 2014-06-19 NOTE — Patient Instructions (Addendum)
53 yo man with prostate cancer under treatment by Dr. Karsten Ro.  He was recently seen at the ED because of polyuria, back pain, hematuria.  They inserted a foley and he passed a large clot.  He still has some burning, but his pain is greatly decreased.  Since the ED, he is better.  He is scheduled to see Dr. Karsten Ro tomorrow at 18.  He needs his Ambien refilled.  He thinks at times he may be having fever because of intermittent chills.  He has night sweats.  Objective:  Alert, cooperative and appropriate HEENT: unremarkable Neck supple: no adenopathy Chest:  Clear Heart: regular, no murmur Abdomen:  Soft, nontender, no HSM or mass, no guarding or rebound Right leg bag with foley in place.  Results for orders placed during the hospital encounter of 06/12/14  URINE CULTURE      Result Value Ref Range   Specimen Description URINE, RANDOM     Special Requests NONE     Culture  Setup Time       Value: 06/13/2014 01:27     Performed at [facility]       Value: NO GROWTH     Performed at [facility]   Culture       Value: NO GROWTH     Performed at [facility]   Report Status 06/13/2014 FINAL     CLINICAL DATA: Right flank pain with hematuria, persistent  EXAM:  CT ABDOMEN AND PELVIS WITH CONTRAST  TECHNIQUE:  Multidetector CT imaging of the abdomen and pelvis was performed  using the standard protocol following bolus administration of  intravenous contrast.  CONTRAST: 123mL OMNIPAQUE IOHEXOL 300 MG/ML SOLN  COMPARISON: June 10, 2014  FINDINGS:  There is subsegmental atelectasis in each posterior lung base.  Liver is prominent measuring 16 cm in length. No focal liver lesions  are identified. Gallbladder wall is not thickened. There is no  biliary duct dilatation.  Spleen, pancreas, adrenals appear normal. Left kidney shows no  evidence of mass, calculus, or hydronephrosis. There is no  left-sided ureteral calculus.  On the  right, there is fluid tracking throughout the renal pelvis  and into the surrounding perinephric fascia. There is moderate  perinephric stranding on the right. Fluid tracks along the course of  the right ureter to the level of the urinary bladder. No calculus is  seen in the right ureter.  There is a Foley catheter in the pelvis, there is a Foley catheter  within the urinary bladder. There is marked thickening of the  urinary bladder wall with urinary bladder wall irregularity noted.  Contrast flows into the urinary bladder from each ureter. Note that  there are small posterior urinary bladder diverticula adjacent to  the respective ureters. The diverticulum on the right measures 2.1 x  1.7 cm. The diverticulum on the left measures 1.3 x 1.3 cm. There  are seed implants throughout the prostate. There is no pelvic mass  apart from the urinary bladder. There are no fluid collections  within the pelvis.  Appendix is absent.  There is no bowel obstruction. No free air or portal venous air.  There is no ascites, adenopathy, or abscess in the abdomen or  pelvis. There is atherosclerotic change in the aorta but no  aneurysm. There is postoperative change in the lumbar spine at L4-5  and L5-S1. There are no blastic or lytic bone lesions appreciable.  IMPRESSION:  There is marked thickening  of the urinary bladder wall with urinary  bladder wall irregularity. There are small urinary bladder or  diverticula near the insertion sites for each ureter. Note that  there are seed implants in the prostate. Question marked cystitis  from the adjacent seed implants in the prostate versus neoplastic  involvement of the urinary bladder. Both entities may exist  concurrently. Direct visualization the urinary bladder is felt to be  warranted in this circumstance.  There has been forniceal/caliceal rupture in the right kidney with  fluid tracking from the right renal pelvis in to the surrounding  perinephric  fascia along the course of the right ureter. There is no  appreciable dilatation of the renal collecting system on this study  with contrast delineating the renal collecting system currently.  There is mild generalized prominence of the right ureter which may  be due to extrinsic compression of the distal right ureter due to  the marked thickening of the urinary bladder wall in this area. No  calculus seen.  Appendix absent. No bowel obstruction. No abscess.  Extensive postoperative change in the lumbar spine.  Liver prominent without focal lesion.  Electronically Signed  By: Lowella Grip M.D.  On: 06/12/2014 17:16    CLINICAL DATA: Right flank pain and dysuria. Cough and chills.  EXAM:  CHEST 2 VIEW  COMPARISON: 01/31/2014  FINDINGS:  Heart size and pulmonary vascularity are normal and the lungs are  clear except for slight atelectasis at the right lung base and  slight thickening of the minor fissure on the right. No osseous  abnormality.  IMPRESSION:  Minimal atelectasis at the right base.  Electronically Signed  By: Rozetta Nunnery M.D.  On: 06/12/2014 13:11   POCT CBC (Order [id number])       POCT CBC  Status: Final result     Visible to patient: This result is not viewable by the patient.     Next appt: None     Dx: Cough; Flank pain                   Ref Range 1wk ago (06/12/14) 1wk ago (06/10/14) 1wk ago (06/10/14) 86mo ago (03/30/14) 39yr ago (07/20/12)     WBC 4.6 - 10.2 K/uL 9.9      7.9 R   6.3 R         Lymph, poc 0.6 - 3.4  1.5                POC LYMPH PERCENT 10 - 50 %L 15.1                MID (cbc) 0 - 0.9  0.7                POC MID % 0 - 12 %M 6.9                POC Granulocyte 2 - 6.9  7.7 (A)                Granulocyte percent 37 - 80 %G 78.0                RBC 4.69 - 6.13 M/uL 4.30 (A)      4.25 R   4.75 R         Hemoglobin 14.1 - 18.1 g/dL 12.9 (A)    14.3 R   12.8 (L) R   14.8 R   16.0 R       HCT, POC 43.5 - 53.7 %  41.1  (A)    42.0 R   38.4 (L) R   43.1 R   47.0 R       MCV 80 - 97 fL 95.4      90.4 R   90.7 R         MCH, POC 27 - 31.2 pg 29.9      30.1 R   31.2 R         MCHC 31.8 - 35.4 g/dL 31.4 (A)      33.3 R   34.3 R         RDW, POC % 15.9                Platelet Count, POC 142 - 424 K/uL 348                MPV 0 - 99.8 fL 6.7               Resulting Agency   [id number]       Notes Recorded by Walden Field on 06/13/2014 at 4:24 PM Gave pt results. Notes Recorded by [name], MD on 06/13/2014 at 10:38 AM Please inform patient of normal result although the hematuria still needs to be explained.        Ref Range 1wk ago (06/12/14) 1wk ago (06/10/14) 3wk ago (05/29/14) 66mo ago (03/30/14) 32yr ago (07/20/12)     Sodium 135 - 145 mEq/L 139    138 R   140    140 R   136        Potassium 3.5 - 5.3 mEq/L 4.9    3.6 (L) R   4.0    4.1 R   4.3 R       Chloride 96 - 112 mEq/L 102    103    104    101    103        CO2 19 - 32 mEq/L 27      25    28           Glucose, Bld 70 - 99 mg/dL 96    115 (H)    88    87    92        BUN 6 - 23 mg/dL 22    18    19    17    16         Creat 0.50 - 1.35 mg/dL 1.28    1.00    1.00    1.10    1.00        Total Bilirubin 0.2 - 1.2 mg/dL 0.4      0.4    0.3 R         Alkaline Phosphatase 39 - 117 U/L 59      60    79          AST 0 - 37 U/L 15      14    24           ALT 0 - 53 U/L 13      14    19           Total Protein 6.0 - 8.3 g/dL 6.2      6.3    7.4          Albumin 3.5 - 5.2 g/dL 3.7      3.8    4.1  Calcium 8.4 - 10.5 mg/dL 9.2      8.9    9.4         Resulting Agency   [facility]          Result Narrative   Assessment:  Patient with a great deal of anxiety who has had recent obstruction from clot in bladder.  Now more comfortable but needs urological follow up.  Plan:  Follow up tomorrow with Dr.  Karsten Ro Prostate cancer - Plan: oxyCODONE-acetaminophen (PERCOCET/ROXICET) 5-325 MG per tablet  Hematuria  Adjustment disorder with mixed anxiety and depressed mood  Insomnia - Plan: zolpidem (AMBIEN) 10 MG tablet  Adjustment disorder with anxious mood - Plan: ALPRAZolam (XANAX XR) 0.5 MG 24 hr tablet Continue the Trazodone  Signed, [name], MD

## 2014-06-21 NOTE — Telephone Encounter (Signed)
Spoke with patient. He doesn't want the records from Oct 13, he wants the records from Sept 8 with Dr. Marin Comment (catheter removal). Will put records in pick up drawer for him to pick up.

## 2014-06-25 ENCOUNTER — Encounter: Payer: Self-pay | Admitting: Family Medicine

## 2014-06-25 DIAGNOSIS — N3289 Other specified disorders of bladder: Secondary | ICD-10-CM

## 2014-06-25 DIAGNOSIS — R399 Unspecified symptoms and signs involving the genitourinary system: Secondary | ICD-10-CM

## 2014-06-25 DIAGNOSIS — R9341 Abnormal radiologic findings on diagnostic imaging of renal pelvis, ureter, or bladder: Secondary | ICD-10-CM

## 2014-06-25 DIAGNOSIS — F419 Anxiety disorder, unspecified: Secondary | ICD-10-CM

## 2014-06-25 DIAGNOSIS — C61 Malignant neoplasm of prostate: Secondary | ICD-10-CM

## 2014-06-25 DIAGNOSIS — Q646 Congenital diverticulum of bladder: Secondary | ICD-10-CM

## 2014-07-07 ENCOUNTER — Other Ambulatory Visit: Payer: Self-pay | Admitting: Family Medicine

## 2014-07-10 ENCOUNTER — Ambulatory Visit (INDEPENDENT_AMBULATORY_CARE_PROVIDER_SITE_OTHER): Payer: Medicare HMO | Admitting: Family Medicine

## 2014-07-10 VITALS — BP 136/78 | HR 100 | Temp 98.0°F | Resp 16 | Ht 71.0 in | Wt 204.8 lb

## 2014-07-10 DIAGNOSIS — N3289 Other specified disorders of bladder: Secondary | ICD-10-CM

## 2014-07-10 DIAGNOSIS — G4700 Insomnia, unspecified: Secondary | ICD-10-CM

## 2014-07-10 DIAGNOSIS — F4323 Adjustment disorder with mixed anxiety and depressed mood: Secondary | ICD-10-CM

## 2014-07-10 MED ORDER — ZOLPIDEM TARTRATE 10 MG PO TABS
10.0000 mg | ORAL_TABLET | Freq: Every evening | ORAL | Status: DC | PRN
Start: 2014-07-10 — End: 2015-01-16

## 2014-07-10 MED ORDER — HYDROCODONE-ACETAMINOPHEN 5-325 MG PO TABS: 1.0000 | ORAL_TABLET | Freq: Four times a day (QID) | ORAL | Status: DC | PRN

## 2014-07-10 MED ORDER — ALPRAZOLAM 0.5 MG PO TABS
ORAL_TABLET | ORAL | Status: DC
Start: 2014-07-10 — End: 2014-12-28

## 2014-07-10 MED ORDER — SERTRALINE HCL 100 MG PO TABS: 100.0000 mg | ORAL_TABLET | Freq: Every day | ORAL | Status: DC

## 2014-07-10 NOTE — Progress Notes (Signed)
   Subjective:    Patient ID: Justin Dunn, male    DOB: 04-14-1961, 53 y.o.   MRN: [id number] This chart was scribed for [name], MD by Zola Button, Medical Scribe. This patient was seen in Room 4 and the patient's care was started at 1:49 PM.   HPI HPI Comments: Note last visit 06/19/14: 53 yo man with prostate cancer under treatment by Dr. Karsten Ro. He was recently seen at the ED because of polyuria, back pain, hematuria. They inserted a foley and he passed a large clot. He still has some burning, but his pain is greatly decreased.  Since the ED, he is better. Eating better.  He is scheduled to see Dr. Karsten Ro tomorrow at 59.  He needs his Ambien refilled.  He thinks at times he may be having fever because of intermittent chills. He has night sweats.   This visit: Justin Dunn is a 53 y.o. male with a hx of adenocarcinoma of prostate who presents to the [facility] for a follow-up for a urinary problem. He still has incontinence and burning dysuria. He went back to his urologist this morning and was given Uribel and another medication. Patient notes some relief with pain medication. Patient had seeds put in on 04/06/14. He is not taking any abx. He has been taking Ambien and Xanax to help him sleep. Patient is somewhat upset because he has not received any check-up calls from his oncologist since planting the seeds.  Review of Systems  Constitutional: Negative for appetite change.  HENT: Negative for ear discharge.   Eyes: Negative for discharge.  Respiratory: Negative for cough.   Cardiovascular: Negative for chest pain.  Gastrointestinal: Negative for diarrhea.  Genitourinary: Positive for dysuria.  Skin: Negative for rash.  Neurological: Negative for seizures.  Psychiatric/Behavioral: Negative for hallucinations.       Objective:   Physical Exam CONSTITUTIONAL: Well developed/well nourished HEAD: Normocephalic/atraumatic EYES:  EOM/PERRL ENMT: Mucous membranes moist NECK: supple no meningeal signs SPINE: entire spine nontender CV: S1/S2 noted, no murmurs/rubs/gallops noted LUNGS: Lungs are clear to auscultation bilaterally, no apparent distress ABDOMEN: soft, nontender, no rebound or guarding GU: no cva tenderness NEURO: Pt is awake/alert, moves all extremitiesx4 EXTREMITIES: pulses normal, full ROM SKIN: warm, color normal PSYCH: no abnormalities of mood noted  Wt Readings from Last 3 Encounters:  07/10/14 204 lb 12.8 oz (92.897 kg)  06/19/14 198 lb 6.4 oz (89.994 kg)  06/12/14 200 lb 6.4 oz (90.901 kg)       Assessment & Plan:  Adjustment disorder with mixed anxiety and depressed mood - Plan: sertraline (ZOLOFT) 100 MG tablet  Bladder spasms - Plan: HYDROcodone-acetaminophen (NORCO) 5-325 MG per tablet  Insomnia - Plan: zolpidem (AMBIEN) 10 MG tablet, ALPRAZolam (XANAX) 0.5 MG tablet  Overall patient seems much more calm and in better spirits than the last visit. I'll plan on seeing him again in a month. Signed, [name], MD

## 2014-07-10 NOTE — Telephone Encounter (Signed)
Faxed

## 2014-08-01 ENCOUNTER — Ambulatory Visit (INDEPENDENT_AMBULATORY_CARE_PROVIDER_SITE_OTHER): Payer: Medicare HMO | Admitting: Family Medicine

## 2014-08-01 VITALS — BP 132/80 | HR 101 | Temp 98.7°F | Resp 17 | Ht 70.5 in | Wt 192.0 lb

## 2014-08-01 DIAGNOSIS — N3289 Other specified disorders of bladder: Secondary | ICD-10-CM

## 2014-08-01 DIAGNOSIS — L0291 Cutaneous abscess, unspecified: Secondary | ICD-10-CM

## 2014-08-01 LAB — POCT URINALYSIS DIPSTICK
Bilirubin, UA: NEGATIVE
Glucose, UA: NEGATIVE
Ketones, UA: NEGATIVE
Leukocytes, UA: NEGATIVE
Nitrite, UA: NEGATIVE
Protein, UA: 30
Spec Grav, UA: 1.02
Urobilinogen, UA: 0.2
pH, UA: 7

## 2014-08-01 LAB — POCT UA - MICROSCOPIC ONLY
Bacteria, U Microscopic: NEGATIVE
Casts, Ur, LPF, POC: NEGATIVE
Crystals, Ur, HPF, POC: NEGATIVE
Mucus, UA: NEGATIVE
Yeast, UA: NEGATIVE

## 2014-08-01 MED ORDER — DOXYCYCLINE HYCLATE 100 MG PO TABS: 100.0000 mg | ORAL_TABLET | Freq: Two times a day (BID) | ORAL | Status: DC

## 2014-08-01 MED ORDER — GABAPENTIN 300 MG PO CAPS: 300.0000 mg | ORAL_CAPSULE | Freq: Three times a day (TID) | ORAL | Status: DC

## 2014-08-01 MED ORDER — HYDROCODONE-ACETAMINOPHEN 5-325 MG PO TABS: 1.0000 | ORAL_TABLET | Freq: Four times a day (QID) | ORAL | Status: DC | PRN

## 2014-08-01 NOTE — Patient Instructions (Addendum)
Incision and Drainage Care After Refer to this sheet in the next few weeks. These instructions provide you with information on caring for yourself after your procedure. Your caregiver may also give you more specific instructions. Your treatment has been planned according to current medical practices, but problems sometimes occur. Call your caregiver if you have any problems or questions after your procedure. HOME CARE INSTRUCTIONS   If antibiotic medicine is given, take it as directed. Finish it even if you start to feel better.  Only take over-the-counter or prescription medicines for pain, discomfort, or fever as directed by your caregiver.  Keep all follow-up appointments as directed by your caregiver.  Change any bandages (dressings) as directed by your caregiver. Replace old dressings with clean dressings.  Wash your hands before and after caring for your wound. You will receive specific instructions for cleansing and caring for your wound.  SEEK MEDICAL CARE IF:   You have increased pain, swelling, or redness around the wound.  You have increased drainage, smell, or bleeding from the wound.  You have muscle aches, chills, or you feel generally sick.  You have a fever. MAKE SURE YOU:   Understand these instructions.  Will watch your condition.  Will get help right away if you are not doing well or get worse. Document Released: 11/09/2011 Document Reviewed: 11/09/2011 [employer] Patient Information 2015 [employer]. This information is not intended to replace advice given to you by your health care provider. Make sure you discuss any questions you have with your health care provider.  

## 2014-08-01 NOTE — Progress Notes (Signed)
Patient has left buttock abscess.  He's been soaking with hot compresses and using preparation H.  He is still self-catheterizing.  He has been taking pyridium to relieve the burning.  No hematuria seen.  He cannot afford the Uribel.  He has not had intercourse since the seed implants.   Objective:  Anxious individual 3 cm right gluteal abscess with surrounding induration.   CLINICAL DATA: Right flank pain with hematuria, persistent  EXAM: CT ABDOMEN AND PELVIS WITH CONTRAST  TECHNIQUE: Multidetector CT imaging of the abdomen and pelvis was performed using the standard protocol following bolus administration of intravenous contrast.  CONTRAST: 167mL OMNIPAQUE IOHEXOL 300 MG/ML SOLN  COMPARISON: June 10, 2014  FINDINGS: There is subsegmental atelectasis in each posterior lung base.  Liver is prominent measuring 16 cm in length. No focal liver lesions are identified. Gallbladder wall is not thickened. There is no biliary duct dilatation.  Spleen, pancreas, adrenals appear normal. Left kidney shows no evidence of mass, calculus, or hydronephrosis. There is no left-sided ureteral calculus.  On the right, there is fluid tracking throughout the renal pelvis and into the surrounding perinephric fascia. There is moderate perinephric stranding on the right. Fluid tracks along the course of the right ureter to the level of the urinary bladder. No calculus is seen in the right ureter.  There is a Foley catheter in the pelvis, there is a Foley catheter within the urinary bladder. There is marked thickening of the urinary bladder wall with urinary bladder wall irregularity noted. Contrast flows into the urinary bladder from each ureter. Note that there are small posterior urinary bladder diverticula adjacent to the respective ureters. The diverticulum on the right measures 2.1 x 1.7 cm. The diverticulum on the left measures 1.3 x 1.3 cm. There are seed implants  throughout the prostate. There is no pelvic mass apart from the urinary bladder. There are no fluid collections within the pelvis.  Appendix is absent.  There is no bowel obstruction. No free air or portal venous air.  There is no ascites, adenopathy, or abscess in the abdomen or pelvis. There is atherosclerotic change in the aorta but no aneurysm. There is postoperative change in the lumbar spine at L4-5 and L5-S1. There are no blastic or lytic bone lesions appreciable.  IMPRESSION: There is marked thickening of the urinary bladder wall with urinary bladder wall irregularity. There are small urinary bladder or diverticula near the insertion sites for each ureter. Note that there are seed implants in the prostate. Question marked cystitis from the adjacent seed implants in the prostate versus neoplastic involvement of the urinary bladder. Both entities may exist concurrently. Direct visualization the urinary bladder is felt to be warranted in this circumstance.  There has been forniceal/caliceal rupture in the right kidney with fluid tracking from the right renal pelvis in to the surrounding perinephric fascia along the course of the right ureter. There is no appreciable dilatation of the renal collecting system on this study with contrast delineating the renal collecting system currently. There is mild generalized prominence of the right ureter which may be due to extrinsic compression of the distal right ureter due to the marked thickening of the urinary bladder wall in this area. No calculus seen.  Appendix absent. No bowel obstruction. No abscess.  Extensive postoperative change in the lumbar spine.  Liver prominent without focal lesion.   Electronically Signed  By: Lowella Grip M.D.  On: 06/12/2014 17:16  Results for orders placed or performed during the hospital encounter  of 06/12/14  Urine culture  Result Value Ref Range   Specimen Description  URINE, RANDOM    Special Requests NONE    Culture  Setup Time      06/13/2014 01:27 Performed at [place] Performed at [facility]    Culture NO GROWTH Performed at [facility]    Report Status 06/13/2014 FINAL    Results for orders placed or performed in visit on 08/01/14  POCT UA - Microscopic Only  Result Value Ref Range   WBC, Ur, HPF, POC 1-3    RBC, urine, microscopic 0-2    Bacteria, U Microscopic neg    Mucus, UA neg    Epithelial cells, urine per micros 0-1    Crystals, Ur, HPF, POC neg    Casts, Ur, LPF, POC neg    Yeast, UA neg   POCT urinalysis dipstick  Result Value Ref Range   Color, UA Amber    Clarity, UA Clear    Glucose, UA Negative    Bilirubin, UA Negative    Ketones, UA Negative    Spec Grav, UA 1.020    Blood, UA Moderate    pH, UA 7.0    Protein, UA 30    Urobilinogen, UA 0.2    Nitrite, UA Negative    Leukocytes, UA Negative      Assessment:  Difficulty voiding.  Possibly the sertarline contributes. Abscess, probable MRSA.  Plan:Abscess - Plan: doxycycline (VIBRA-TABS) 100 MG tablet, HYDROcodone-acetaminophen (NORCO) 5-325 MG per tablet, Wound culture  Bladder spasms - Plan: HYDROcodone-acetaminophen (NORCO) 5-325 MG per tablet, POCT UA - Microscopic Only, POCT urinalysis dipstick, Urine culture  Signed, [name], MD

## 2014-08-01 NOTE — Progress Notes (Signed)
I&D of Abscess: Verbal consent obtained. Alcohol prep pad applied followed by local anesthesia with 3cc of 2% lidocaine with epi. Site cleansed with Betadine.  Incision of 1cm was made using a 11 blade, discharge of copious amounts of pus and serosanguinous fluid. Wound cavity was explored with curved hemostats and aggressively packed with 1/4" plain packing. Cleansed and dressed. After care instructions provided. Patient to return to clinic on 08/03/2014 for reevaluation/repacking.  Jaynee Eagles, PA-C [facility] [phone] 08/01/2014  2:12 PM

## 2014-08-02 LAB — URINE CULTURE
Colony Count: NO GROWTH
Organism ID, Bacteria: NO GROWTH

## 2014-08-03 ENCOUNTER — Ambulatory Visit (INDEPENDENT_AMBULATORY_CARE_PROVIDER_SITE_OTHER): Payer: Medicare HMO | Admitting: Family Medicine

## 2014-08-03 VITALS — BP 130/80 | HR 92 | Temp 98.2°F | Resp 18 | Ht 70.5 in | Wt 193.2 lb

## 2014-08-03 DIAGNOSIS — R339 Retention of urine, unspecified: Secondary | ICD-10-CM

## 2014-08-03 DIAGNOSIS — L0231 Cutaneous abscess of buttock: Secondary | ICD-10-CM

## 2014-08-03 NOTE — Progress Notes (Signed)
Patient ID: Justin Dunn, male   DOB: 07/12/1961, 53 y.o.   MRN: [id number]  This chart was scribed for Justin Haber, MD by Justin Dunn, ED Scribe. The patient was seen in room 3. Patient's care was started at 12:48 PM.  Patient ID: Justin Dunn MRN: [id number], DOB: [birth year], 53 y.o. Date of Encounter: 08/03/2014, 12:48 PM  Primary Physician: Justin Hews, NP  Chief Complaint  Patient presents with  . Follow-up  . Wound Check   HPI: 53 y.o. year old male with history below presents for a follow-up regarding a wound. Pt was seen 2 days ago for an abscess to the L buttock that he had lanced. He reports minimal drainage with laying on the area when he returned home. Pt has tried baby wipes and Hydrocodone with relief.   Catheter  Pt self-catheterizes. He states that recently he has felt "somehting draining" inside. Pt has a h/o prostate cancer and had 99 seeds implanted.   Past Medical History  Diagnosis Date  . Hypertension   . Chronic low back pain   . Anxiety   . Hypogonadism male   . Depression   . History of gastric ulcer     as child  . ED (erectile dysfunction)   . History of colon polyps   . Lumbar spine scoliosis   . Wears glasses   . At risk for sleep apnea     STOP-BANG= 4   SENT TO PCP 04-02-2014  . Prostate cancer     Justin Dunn ( oncologist) and Justin Dunn)   . Incomplete bladder emptying   . Retention of urine, unspecified   . H/O: depression   . Sleep apnea   . Premature ejaculation      Home Meds: Prior to Admission medications   Medication Sig Start Date End Date Taking? Authorizing Provider  ALPRAZolam Justin Dunn) 0.5 MG tablet take 1 tablet by mouth at bedtime if needed 07/10/14  Yes Justin Haber, MD  doxycycline (VIBRA-TABS) 100 MG tablet Take 1 tablet (100 mg total) by mouth 2 (two) times daily. 08/01/14  Yes Justin Haber, MD  enalapril-hydrochlorothiazide (VASERETIC) 10-25 MG per tablet Take 1 tablet by mouth every  morning. 05/24/14  Yes Justin S Jeffery, PA-C  gabapentin (NEURONTIN) 300 MG capsule Take 1 capsule (300 mg total) by mouth 3 (three) times daily. 08/01/14  Yes Justin Haber, MD  HYDROcodone-acetaminophen (NORCO) 5-325 MG per tablet Take 1 tablet by mouth every 6 (six) hours as needed for moderate pain. 08/01/14  Yes Justin Haber, MD  ibuprofen (ADVIL,MOTRIN) 800 MG tablet Take 800 mg by mouth 3 (three) times daily with meals.   Yes Historical Provider, MD  Meth-Hyo-M Bl-Na Phos-Ph Sal (URIBEL PO) Take by mouth.   Yes Historical Provider, MD  Nutritional Supplements (NUTRITIONAL SUPPLEMENT PLUS PO) Take 1 capsule by mouth daily. Gets at health food store unsure of name of medication   Yes Historical Provider, MD  sertraline (ZOLOFT) 100 MG tablet Take 1 tablet (100 mg total) by mouth daily. 07/10/14  Yes Justin Haber, MD  silodosin (RAPAFLO) 8 MG CAPS capsule Take 8 mg by mouth daily with breakfast.   Yes Historical Provider, MD  traZODone (DESYREL) 100 MG tablet Take 100 mg by mouth at bedtime.   Yes Historical Provider, MD  zolpidem (AMBIEN) 10 MG tablet Take 1 tablet (10 mg total) by mouth at bedtime as needed for sleep. 07/10/14  Yes Justin Haber, MD    Allergies: No  Known Allergies  History   Social History  . Marital Status: Divorced    Spouse Name: N/A    Number of Children: N/A  . Years of Education: N/A   Occupational History  . Not on file.   Social History Main Topics  . Smoking status: Current Every Day Smoker -- 1.00 packs/day for 10 years    Types: Cigarettes, Cigars  . Smokeless tobacco: Never Used     Comment: currently smokes 2 small cigar per day/  quit cigarettes 2011  . Alcohol Use: 0.0 oz/week    0 Not specified per week     Comment: occasional  . Drug Use: No  . Sexual Activity: No   Other Topics Concern  . Not on file   Social History Narrative     Review of Systems: Constitutional: negative for chills, fever, night sweats, weight changes, or  fatigue  HEENT: negative for vision changes, hearing loss, congestion, rhinorrhea, ST, epistaxis, or sinus pressure Cardiovascular: negative for chest pain or palpitations Respiratory: negative for hemoptysis, wheezing, shortness of breath, or cough Abdominal: negative for abdominal pain, nausea, vomiting, diarrhea, or constipation Dermatological: negative for rash, +wound Neurologic: negative for headache, dizziness, or syncope All other systems reviewed and are otherwise negative with the exception to those above and in the HPI.  Physical Exam: Triage Vitals: Blood pressure 130/80, pulse 92, temperature 98.2 F (36.8 C), temperature source Oral, resp. rate 18, height 5' 10.5" (1.791 m), weight 193 lb 3.2 oz (87.635 kg), SpO2 98 %., Body mass index is 27.32 kg/(m^2). General: Well developed, well nourished, in no acute distress. Head: Normocephalic, atraumatic, eyes without discharge, sclera non-icteric, nares are without discharge. Bilateral auditory canals clear, TM's are without perforation, pearly grey and translucent with reflective cone of light bilaterally. Oral cavity moist, posterior pharynx without exudate, erythema, peritonsillar abscess, or post nasal drip.  Neck: Supple. No thyromegaly. Full ROM. No lymphadenopathy. Lungs: Clear bilaterally to auscultation without wheezes, rales, or rhonchi. Breathing is unlabored. Heart: RRR with S1 S2. No murmurs, rubs, or gallops appreciated. Abdomen: Soft, non-tender, non-distended with normoactive bowel sounds. No hepatomegaly. No rebound/guarding. No obvious abdominal masses. Msk:  Strength and tone normal for age. Extremities/Skin: Warm and dry. No clubbing or cyanosis. No edema. No rashes or suspicious lesions. Packing removed. No induration. Base of abscess cleaned.  Neuro: Alert and oriented X 3. Moves all extremities spontaneously. Gait is normal. CNII-XII grossly in tact. Psych:  Responds to questions appropriately with a normal affect.    Labs:  ASSESSMENT AND PLAN:  53 y.o. year old male with Abscess of left buttock  Urinary retention  Continue antibiotics and lower dose of sertraline.  Recheck 2 weeks  I personally performed the services described in this documentation, which was scribed in my presence. The recorded information has been reviewed and is accurate.  Signed, Justin Haber, MD 08/03/2014 12:48 PM

## 2014-08-04 LAB — WOUND CULTURE
Gram Stain: NONE SEEN
Gram Stain: NONE SEEN

## 2014-08-17 ENCOUNTER — Ambulatory Visit (INDEPENDENT_AMBULATORY_CARE_PROVIDER_SITE_OTHER): Payer: Medicare HMO | Admitting: Family Medicine

## 2014-08-17 VITALS — BP 132/86 | HR 83 | Temp 98.9°F | Resp 18 | Ht 71.0 in | Wt 198.0 lb

## 2014-08-17 DIAGNOSIS — F32A Depression, unspecified: Secondary | ICD-10-CM

## 2014-08-17 DIAGNOSIS — N400 Enlarged prostate without lower urinary tract symptoms: Secondary | ICD-10-CM

## 2014-08-17 DIAGNOSIS — C61 Malignant neoplasm of prostate: Secondary | ICD-10-CM

## 2014-08-17 DIAGNOSIS — N529 Male erectile dysfunction, unspecified: Secondary | ICD-10-CM

## 2014-08-17 DIAGNOSIS — F329 Major depressive disorder, single episode, unspecified: Secondary | ICD-10-CM

## 2014-08-17 DIAGNOSIS — R300 Dysuria: Secondary | ICD-10-CM

## 2014-08-17 LAB — POCT CBC
Granulocyte percent: 64.9 %G (ref 37–80)
HCT, POC: 41.6 % — AB (ref 43.5–53.7)
Hemoglobin: 13.1 g/dL — AB (ref 14.1–18.1)
Lymph, poc: 2.3 (ref 0.6–3.4)
MCH, POC: 29.4 pg (ref 27–31.2)
MCHC: 31.5 g/dL — AB (ref 31.8–35.4)
MCV: 93.4 fL (ref 80–97)
MID (cbc): 0.5 (ref 0–0.9)
MPV: 6.8 fL (ref 0–99.8)
POC Granulocyte: 5.2 (ref 2–6.9)
POC LYMPH PERCENT: 29 %L (ref 10–50)
POC MID %: 6.1 %M (ref 0–12)
Platelet Count, POC: 316 10*3/uL (ref 142–424)
RBC: 4.45 M/uL — AB (ref 4.69–6.13)
RDW, POC: 17.8 %
WBC: 8 10*3/uL (ref 4.6–10.2)

## 2014-08-17 LAB — POCT URINALYSIS DIPSTICK
Bilirubin, UA: NEGATIVE
Blood, UA: NEGATIVE
Glucose, UA: 100
Ketones, UA: NEGATIVE
Leukocytes, UA: NEGATIVE
Nitrite, UA: POSITIVE
Spec Grav, UA: 1.015
Urobilinogen, UA: 1
pH, UA: 5

## 2014-08-17 LAB — POCT UA - MICROSCOPIC ONLY
Bacteria, U Microscopic: NEGATIVE
Crystals, Ur, HPF, POC: NEGATIVE
Epithelial cells, urine per micros: NEGATIVE
Mucus, UA: NEGATIVE
Yeast, UA: NEGATIVE

## 2014-08-17 MED ORDER — SILDENAFIL CITRATE 100 MG PO TABS: 50.0000 mg | ORAL_TABLET | Freq: Every day | ORAL | Status: DC | PRN

## 2014-08-17 MED ORDER — FINASTERIDE 5 MG PO TABS
5.0000 mg | ORAL_TABLET | Freq: Every day | ORAL | Status: DC
Start: 2014-08-17 — End: 2014-09-07

## 2014-08-17 MED ORDER — TRAZODONE HCL 100 MG PO TABS: 100.0000 mg | ORAL_TABLET | Freq: Every day | ORAL | Status: DC

## 2014-08-17 NOTE — Patient Instructions (Signed)
Restart the sulfa medicine twice a day.  Recheck one month

## 2014-08-17 NOTE — Addendum Note (Signed)
Addended by: [name] on: 08/17/2014 05:16 PM   Modules accepted: Orders

## 2014-08-17 NOTE — Progress Notes (Addendum)
Subjective:    Patient ID: Justin Dunn, male    DOB: [birth year], 53 y.o.   MRN: [id number] This chart was scribed for [name], MD by Zola Button, Medical Scribe. This patient was seen in Room 9 and the patient's care was started at 12:03 PM.   HPI HPI Comments: Justin Dunn is a 53 y.o. male with a hx of adenocarcinoma of prostate who presents to the [facility] for a follow-up. Patient uses a catheter and notes that he can feel a pinching and burning pain when he removes his catheter; he has noticed the pain since 2 weeks ago. He also notes fatigue and some occasional chills. Patient has finished his course of doxycycline. He only takes hydrocodone when he feels pain. He denies cough and feet swelling  Patient having erectile dysfunction.   Review of Systems  Constitutional: Positive for chills and fatigue.  Respiratory: Negative for cough.   Cardiovascular: Negative for leg swelling.       Objective:   Physical Exam CONSTITUTIONAL: Well developed/well nourished HEAD: Normocephalic/atraumatic EYES: EOM/PERRL ENMT: Mucous membranes moist NECK: supple no meningeal signs SPINE: entire spine nontender CV: S1/S2 noted, no murmurs/rubs/gallops noted LUNGS: Lungs are clear to auscultation bilaterally, no apparent distress ABDOMEN: soft, nontender, no rebound or guarding GU: no cva tenderness NEURO: Pt is awake/alert, moves all extremitiesx4 EXTREMITIES: pulses normal, full ROM SKIN: warm, color normal PSYCH: no abnormalities of mood noted  Results for orders placed or performed in visit on 08/17/14  POCT CBC  Result Value Ref Range   WBC 8.0 4.6 - 10.2 K/uL   Lymph, poc 2.3 0.6 - 3.4   POC LYMPH PERCENT 29.0 10 - 50 %L   MID (cbc) 0.5 0 - 0.9   POC MID % 6.1 0 - 12 %M   POC Granulocyte 5.2 2 - 6.9   Granulocyte percent 64.9 37 - 80 %G   RBC 4.45 (A) 4.69 - 6.13 M/uL   Hemoglobin 13.1 (A) 14.1 - 18.1 g/dL   HCT, POC 41.6 (A) 43.5 - 53.7 %   MCV 93.4 80 - 97 fL   MCH, POC 29.4 27 - 31.2 pg   MCHC 31.5 (A) 31.8 - 35.4 g/dL   RDW, POC 17.8 %   Platelet Count, POC 316 142 - 424 K/uL   MPV 6.8 0 - 99.8 fL  POCT UA - Microscopic Only  Result Value Ref Range   WBC, Ur, HPF, POC 1-2    RBC, urine, microscopic 1-3    Bacteria, U Microscopic neg    Mucus, UA neg    Epithelial cells, urine per micros neg    Crystals, Ur, HPF, POC neg    Casts, Ur, LPF, POC waxy    Yeast, UA neg   POCT urinalysis dipstick  Result Value Ref Range   Color, UA orange    Clarity, UA clear    Glucose, UA 100    Bilirubin, UA neg    Ketones, UA neg    Spec Grav, UA 1.015    Blood, UA neg    pH, UA 5.0    Protein, UA trace    Urobilinogen, UA 1.0    Nitrite, UA positive    Leukocytes, UA Negative        Assessment & Plan:   This chart was scribed in my presence and reviewed by me personally.    ICD-9-CM ICD-10-CM   1. Dysuria 788.1 R30.0 POCT CBC  POCT UA - Microscopic Only     POCT urinalysis dipstick     Urine culture  2. Prostate cancer 185 C61 POCT CBC     POCT UA - Microscopic Only     POCT urinalysis dipstick     Urine culture  3. Depression 311 F32.9 traZODone (DESYREL) 100 MG tablet  4. Erectile dysfunction, unspecified erectile dysfunction type 607.84 N52.9 sildenafil (VIAGRA) 100 MG tablet  5. BPH (benign prostatic hyperplasia) 600.00 N40.0 finasteride (PROSCAR) 5 MG tablet    Patient is less depressed now. He still having the cath himself. I'll see him back in a month.  I hope that the finasteride will help him over the long haul.  Signed, [name], MD

## 2014-08-19 LAB — URINE CULTURE
Colony Count: NO GROWTH
Organism ID, Bacteria: NO GROWTH

## 2014-08-30 ENCOUNTER — Telehealth: Payer: Self-pay

## 2014-08-30 MED ORDER — LIDOCAINE HCL 2 % EX GEL: 1.0000 "application " | CUTANEOUS | Status: DC | PRN

## 2014-08-30 NOTE — Telephone Encounter (Signed)
I think getting the brand that he has used in the past would be the best idea.  I have sent in some lidocaine jelly to help in the mean time that he can use when inserting the catheter.

## 2014-08-30 NOTE — Telephone Encounter (Signed)
I am not scheduled to work until Monday.  Please let patient know that I am not allowed to work except when scheduled.  In meantime, he can rely on Pyridium or come in to be seen by another provider, or return to urology

## 2014-08-30 NOTE — Telephone Encounter (Signed)
Pt states that he is having pain with the extraction of the catheter. He states the removal of the catheter is burning his urethra. He is wondering if there is anything else that he might be able to use for a lubricant- Can he use one with a little lidocaine in it? He is also wanting to ask about little tears that may have occurred as a result of doing this multiple times a day.  We discussed his process and he is doing everything per procedure.   He is using a 16 French catheter the tubing is rubber. He was using a different brand before and had more comfort. He is going to try to find out what brand this was or how to get this brand again.

## 2014-08-30 NOTE — Telephone Encounter (Signed)
Pt of Dr. Carlean Jews states that he needs pain medication for a catheter that is causing him a lot of pain. Please advise

## 2014-08-30 NOTE — Telephone Encounter (Signed)
Pt.notified

## 2014-09-07 ENCOUNTER — Telehealth: Payer: Self-pay | Admitting: *Deleted

## 2014-09-07 ENCOUNTER — Ambulatory Visit (INDEPENDENT_AMBULATORY_CARE_PROVIDER_SITE_OTHER): Payer: Medicare HMO | Admitting: Family Medicine

## 2014-09-07 VITALS — BP 128/68 | HR 94 | Temp 98.9°F | Resp 18 | Ht 70.0 in | Wt 192.6 lb

## 2014-09-07 DIAGNOSIS — R300 Dysuria: Secondary | ICD-10-CM

## 2014-09-07 DIAGNOSIS — N400 Enlarged prostate without lower urinary tract symptoms: Secondary | ICD-10-CM

## 2014-09-07 DIAGNOSIS — N3289 Other specified disorders of bladder: Secondary | ICD-10-CM

## 2014-09-07 DIAGNOSIS — L0291 Cutaneous abscess, unspecified: Secondary | ICD-10-CM

## 2014-09-07 LAB — POCT URINALYSIS DIPSTICK
Bilirubin, UA: NEGATIVE
Blood, UA: NEGATIVE
Glucose, UA: NEGATIVE
Ketones, UA: NEGATIVE
Leukocytes, UA: NEGATIVE
Nitrite, UA: NEGATIVE
Spec Grav, UA: >=1.03
Urobilinogen, UA: 1
pH, UA: 6.5

## 2014-09-07 LAB — POCT UA - MICROSCOPIC ONLY
Bacteria, U Microscopic: NEGATIVE
Casts, Ur, LPF, POC: NEGATIVE
Crystals, Ur, HPF, POC: NEGATIVE
Mucus, UA: NEGATIVE
RBC, urine, microscopic: NEGATIVE
WBC, Ur, HPF, POC: NEGATIVE
Yeast, UA: NEGATIVE

## 2014-09-07 MED ORDER — GABAPENTIN 300 MG PO CAPS: 300.0000 mg | ORAL_CAPSULE | Freq: Three times a day (TID) | ORAL | Status: DC

## 2014-09-07 MED ORDER — FINASTERIDE 5 MG PO TABS: 5.0000 mg | ORAL_TABLET | Freq: Every day | ORAL | Status: DC

## 2014-09-07 MED ORDER — HYDROCODONE-ACETAMINOPHEN 5-325 MG PO TABS: 1.0000 | ORAL_TABLET | Freq: Four times a day (QID) | ORAL | Status: DC | PRN

## 2014-09-07 NOTE — Telephone Encounter (Signed)
Spoke with patient regarding his referral.  Advised patient that Dr. Carlean Jews spoke with Justin Dunn over at [facility] and that he would like to have an appointment with him that we could arrange for him.  Patient stated he would be more than happy to have an appointment set up.  Informed patient he would receive another call back from the referral staff regarding the date/time.

## 2014-09-07 NOTE — Addendum Note (Signed)
Addended by: Burnis Kingfisher on: 09/07/2014 06:04 PM   Modules accepted: Orders

## 2014-09-07 NOTE — Progress Notes (Signed)
Subjective:  This chart was scribed for Justin Haber, MD by Justin Dunn, Medical scribe. This patient was seen in ROOM 5 and the patient's care was started 1:57 PM.   Patient ID: Justin Dunn, male    DOB: June 10, 1961, 54 y.o.   MRN: [id number]   Chief Complaint  Patient presents with   Dysuria    still feel like is a blockage   Medication Refill    HPI HPI Comments: Justin Dunn is a 54 y.o. male who presents to [facility] complaining of dysuria and ongoing urinary obstruction for months following seed implant He recently passed a metal fragment about the size of a rice kernal that he thinks was a seed implant.   Patient Active Problem List   Diagnosis Date Noted   Anxiety 06/25/2014   Hutch diverticulum of urinary bladder 06/25/2014   Lower urinary tract symptoms 06/25/2014   Bladder spasms 06/25/2014   Abnormal computed tomography of bladder 06/25/2014   Adenocarcinoma of prostate 06/25/2014   Hypogonadism in male 05/30/2014   Malignant neoplasm of prostate 01/09/2014    Past Medical History  Diagnosis Date   Hypertension    Chronic low back pain    Anxiety    Hypogonadism male    Depression    History of gastric ulcer     as child   ED (erectile dysfunction)    History of colon polyps    Lumbar spine scoliosis    Wears glasses    At risk for sleep apnea     STOP-BANG= 4   SENT TO PCP 04-02-2014   Prostate cancer     Dr Tammi Klippel ( oncologist) and Dr Loel Lofty ( [facility])    Incomplete bladder emptying    Retention of urine, unspecified    H/O: depression    Sleep apnea    Premature ejaculation    No Known Allergies Current Outpatient Prescriptions on File Prior to Visit  Medication Sig Dispense Refill   ALPRAZolam (XANAX) 0.5 MG tablet take 1 tablet by mouth at bedtime if needed 30 tablet 5   cyclobenzaprine (FLEXERIL) 10 MG tablet Take 10 mg by mouth daily.     enalapril-hydrochlorothiazide (VASERETIC) 10-25 MG per  tablet Take 1 tablet by mouth every morning. 90 tablet 0   finasteride (PROSCAR) 5 MG tablet Take 1 tablet (5 mg total) by mouth daily. 30 tablet 5   gabapentin (NEURONTIN) 300 MG capsule Take 1 capsule (300 mg total) by mouth 3 (three) times daily. 90 capsule 6   HYDROcodone-acetaminophen (NORCO) 5-325 MG per tablet Take 1 tablet by mouth every 6 (six) hours as needed for moderate pain. 30 tablet 0   ibuprofen (ADVIL,MOTRIN) 800 MG tablet Take 800 mg by mouth 3 (three) times daily with meals.     lidocaine (XYLOCAINE) 2 % jelly Place 1 application into the urethra as needed. 30 mL 0   Meth-Hyo-M Bl-Na Phos-Ph Sal (URIBEL PO) Take by mouth.     Nutritional Supplements (NUTRITIONAL SUPPLEMENT PLUS PO) Take 1 capsule by mouth daily. Gets at health food store unsure of name of medication     Phenazopyridine HCl (AZO URINARY PAIN PO) Take by mouth.     sertraline (ZOLOFT) 100 MG tablet Take 1 tablet (100 mg total) by mouth daily. 90 tablet 3   sildenafil (VIAGRA) 100 MG tablet Take 0.5-1 tablets (50-100 mg total) by mouth daily as needed for erectile dysfunction. 6 tablet 11   silodosin (RAPAFLO) 8 MG CAPS capsule Take  8 mg by mouth daily with breakfast.     traZODone (DESYREL) 100 MG tablet Take 1 tablet (100 mg total) by mouth at bedtime. 90 tablet 3   zolpidem (AMBIEN) 10 MG tablet Take 1 tablet (10 mg total) by mouth at bedtime as needed for sleep. 30 tablet 3   No current facility-administered medications on file prior to visit.     Review of Systems  Genitourinary: Positive for dysuria.       Objective:   Physical Exam  Constitutional: He is oriented to person, place, and time. He appears well-developed and well-nourished.  HENT:  Head: Normocephalic and atraumatic.  Eyes: Right eye exhibits no discharge. Left eye exhibits no discharge.  Neck: Neck supple. No tracheal deviation present.  Cardiovascular: Normal rate.   Pulmonary/Chest: Effort normal. No respiratory  distress.  Abdominal: He exhibits no distension.  Neurological: He is alert and oriented to person, place, and time.  Skin: Skin is warm and dry.  Psychiatric: He has a normal mood and affect. His behavior is normal.  Nursing note and vitals reviewed.  Results for orders placed or performed in visit on 09/07/14  POCT UA - Microscopic Only  Result Value Ref Range   WBC, Ur, HPF, POC neg    RBC, urine, microscopic neg    Bacteria, U Microscopic neg    Mucus, UA neg    Epithelial cells, urine per micros 0-1    Crystals, Ur, HPF, POC neg    Casts, Ur, LPF, POC neg    Yeast, UA neg   POCT urinalysis dipstick  Result Value Ref Range   Color, UA yellow    Clarity, UA clear    Glucose, UA neg    Bilirubin, UA neg    Ketones, UA neg    Spec Grav, UA >=1.030    Blood, UA neg    pH, UA 6.5    Protein, UA trace    Urobilinogen, UA 1.0    Nitrite, UA neg    Leukocytes, UA Negative        Assessment & Plan:

## 2014-09-07 NOTE — Telephone Encounter (Signed)
Called and left an message for patient on mobile number regarding his referral to an urologist.  Advised pt to call back for further information.

## 2014-09-28 ENCOUNTER — Other Ambulatory Visit: Payer: Self-pay

## 2014-09-28 DIAGNOSIS — N3289 Other specified disorders of bladder: Secondary | ICD-10-CM

## 2014-09-28 DIAGNOSIS — R300 Dysuria: Secondary | ICD-10-CM

## 2014-09-28 NOTE — Telephone Encounter (Signed)
Pt reqs RF of hydrocodone. Pended.

## 2014-09-29 MED ORDER — HYDROCODONE-ACETAMINOPHEN 5-325 MG PO TABS: 1.0000 | ORAL_TABLET | Freq: Four times a day (QID) | ORAL | Status: DC | PRN

## 2014-10-01 NOTE — Telephone Encounter (Signed)
Notified pt on VM Rx ready. 

## 2014-10-08 ENCOUNTER — Other Ambulatory Visit: Payer: Self-pay | Admitting: Family Medicine

## 2014-10-09 NOTE — Telephone Encounter (Signed)
Dr L, you have seen pt several times lately, but don't see this med discussed. Do you want to RF?

## 2014-10-30 ENCOUNTER — Telehealth: Payer: Self-pay

## 2014-10-30 NOTE — Telephone Encounter (Signed)
HYDROcodone-acetaminophen (NORCO) 5-325 MG per tablet [[id number]]     Order Details    Dose: 1 tablet Route: Oral Frequency: Every 6 hours PRN for moderate pain   Dispense Quantity:  30 tablet Refills:  0 Fills Remaining:  0          Sig: Take 1 tablet by mouth every 6 (six) hours as needed for moderate pain.         Written Date:  09/29/14 Expiration Date:  11/28/14     Start Date:  09/29/14 End Date:  --     Ordering Provider:  -- Authorizing Provider:  [name], MD Ordering User:  [name], MD          Diagnosis Association: Bladder spasms (596.89); Dysuria (788.1)             Original Order:  HYDROcodone-acetaminophen (NORCO) 5-325 MG per tablet [[id number]]      RTC?

## 2014-10-30 NOTE — Telephone Encounter (Signed)
Pt called to request a refill on hydrocodone

## 2014-10-31 ENCOUNTER — Other Ambulatory Visit: Payer: Self-pay | Admitting: Family Medicine

## 2014-10-31 DIAGNOSIS — N3289 Other specified disorders of bladder: Secondary | ICD-10-CM

## 2014-10-31 DIAGNOSIS — R300 Dysuria: Secondary | ICD-10-CM

## 2014-10-31 MED ORDER — HYDROCODONE-ACETAMINOPHEN 5-325 MG PO TABS: 1.0000 | ORAL_TABLET | Freq: Four times a day (QID) | ORAL | Status: DC | PRN

## 2014-10-31 NOTE — Telephone Encounter (Signed)
Notified pt ready. 

## 2014-11-19 ENCOUNTER — Telehealth: Payer: Self-pay

## 2014-11-19 NOTE — Telephone Encounter (Signed)
Pt is requesting a refill of hydrocodone.

## 2014-11-20 ENCOUNTER — Other Ambulatory Visit: Payer: Self-pay | Admitting: Family Medicine

## 2014-11-20 DIAGNOSIS — R300 Dysuria: Secondary | ICD-10-CM

## 2014-11-20 DIAGNOSIS — N3289 Other specified disorders of bladder: Secondary | ICD-10-CM

## 2014-11-20 MED ORDER — HYDROCODONE-ACETAMINOPHEN 5-325 MG PO TABS: 1.0000 | ORAL_TABLET | Freq: Four times a day (QID) | ORAL | Status: DC | PRN

## 2014-11-21 NOTE — Telephone Encounter (Signed)
Notified pt ready. 

## 2014-12-06 ENCOUNTER — Telehealth: Payer: Self-pay

## 2014-12-06 DIAGNOSIS — N3289 Other specified disorders of bladder: Secondary | ICD-10-CM

## 2014-12-06 DIAGNOSIS — R300 Dysuria: Secondary | ICD-10-CM

## 2014-12-06 MED ORDER — HYDROCODONE-ACETAMINOPHEN 5-325 MG PO TABS: 1.0000 | ORAL_TABLET | Freq: Four times a day (QID) | ORAL | Status: DC | PRN

## 2014-12-06 NOTE — Telephone Encounter (Signed)
Patient came in to clinic today asking to get his HYDROcodone-acetaminophen ([place]) 5-325 MG per tablet refilled by Dr. Carlean Jews. He stated that he will be in clinic tomorrow to see Dr. Carlean Jews but that he is out of meds and needs something to last him until he can talk to Dr.L. He is also going to his back doctor tomorrow before coming to talk to him.   Making high priority because he stated he is out of meds and showed an empty bottle and stated that he is having trouble using the bathroom (urine) and he feels like he is having bladder spasm and seemed to be in some pain.

## 2014-12-06 NOTE — Telephone Encounter (Signed)
Dr.L please advise.

## 2014-12-21 ENCOUNTER — Telehealth: Payer: Self-pay

## 2014-12-21 NOTE — Telephone Encounter (Signed)
Pt needs a refill on his hydrocodone.  He usually sees Dr. Carlean Jews, but cant get in until next Wednesday to see him.  [phone]

## 2014-12-22 ENCOUNTER — Other Ambulatory Visit: Payer: Self-pay | Admitting: Family Medicine

## 2014-12-22 DIAGNOSIS — R300 Dysuria: Secondary | ICD-10-CM

## 2014-12-22 DIAGNOSIS — N3289 Other specified disorders of bladder: Secondary | ICD-10-CM

## 2014-12-22 MED ORDER — HYDROCODONE-ACETAMINOPHEN 5-325 MG PO TABS
1.0000 | ORAL_TABLET | Freq: Four times a day (QID) | ORAL | Status: DC | PRN
Start: 2014-12-22 — End: 2015-01-16

## 2014-12-23 NOTE — Telephone Encounter (Signed)
PATIENT CALLED AGAIN WANTING TO KNOW IF DR. Joseph Art HAS REFILLED HIS HYDROCODONE. HE STATES HE REALLY NEEDS TO GET IT FILLED. BEST PHONE [phone] (CELL)  Justin Dunn

## 2014-12-25 NOTE — Telephone Encounter (Signed)
I approved and signed a refill several days ago.  I don't know where it is

## 2014-12-25 NOTE — Telephone Encounter (Signed)
Checked and Rx is in drawer. Notified pt ready.

## 2014-12-26 ENCOUNTER — Ambulatory Visit (INDEPENDENT_AMBULATORY_CARE_PROVIDER_SITE_OTHER): Payer: Medicare HMO | Admitting: Family Medicine

## 2014-12-26 VITALS — BP 110/72 | HR 75 | Temp 97.7°F | Resp 18 | Ht 70.0 in | Wt 195.6 lb

## 2014-12-26 DIAGNOSIS — F329 Major depressive disorder, single episode, unspecified: Secondary | ICD-10-CM

## 2014-12-26 DIAGNOSIS — C61 Malignant neoplasm of prostate: Secondary | ICD-10-CM | POA: Diagnosis not present

## 2014-12-26 DIAGNOSIS — N400 Enlarged prostate without lower urinary tract symptoms: Secondary | ICD-10-CM

## 2014-12-26 DIAGNOSIS — F32A Depression, unspecified: Secondary | ICD-10-CM

## 2014-12-26 DIAGNOSIS — R300 Dysuria: Secondary | ICD-10-CM

## 2014-12-26 DIAGNOSIS — F4323 Adjustment disorder with mixed anxiety and depressed mood: Secondary | ICD-10-CM

## 2014-12-26 MED ORDER — TRAZODONE HCL 100 MG PO TABS: 100.0000 mg | ORAL_TABLET | Freq: Every day | ORAL | Status: DC

## 2014-12-26 MED ORDER — CIPROFLOXACIN HCL 500 MG PO TABS: 500.0000 mg | ORAL_TABLET | Freq: Two times a day (BID) | ORAL | Status: DC

## 2014-12-26 MED ORDER — FINASTERIDE 5 MG PO TABS: 5.0000 mg | ORAL_TABLET | Freq: Every day | ORAL | Status: DC

## 2014-12-26 MED ORDER — SERTRALINE HCL 100 MG PO TABS: 100.0000 mg | ORAL_TABLET | Freq: Every day | ORAL | Status: DC

## 2014-12-26 NOTE — Progress Notes (Signed)
Subjective:  This chart was scribed for [name] MD, by Tamsen Roers, at [facility].  This patient was seen in room 11 and the patient's care was started at 11:32 AM.    Patient ID: Justin Dunn, male    DOB: 06/27/1961, 54 y.o.   MRN: [id number]  Chief Complaint  Patient presents with   Follow-up    Medication discussion     HPI HPI Comments: Justin Dunn is a 54 y.o. male  who presents to [facility] for a follow up.  Patient has been using a urinary catheter for the past six months and will see Dr. Rosana Hoes on May 23rd.  He states that he has increased gas when he uses the catheter and feels a pain between his rectum and his testicles. Patient states he is unable to urinate as it only trickles.  He cathederizes himself 4 times in the morning and when he does have the urge to urinate, he has a burning sensation in his abdomen.  Patient wants to make sure that there is full communication with Dr. Rosana Hoes so he knows that he does not have a pre- existing condition.  Patient notes that the hydrocodone does not relieve his pain/ burning after the cathederization. Patient takes Zoloft and Trazodone to sleep and states that he wakes up some nights sweating so much that he has to take a shower.   He has not purchased Viagra due to the cost.     Patient Active Problem List   Diagnosis Date Noted   Anxiety 06/25/2014   Hutch diverticulum of urinary bladder 06/25/2014   Lower urinary tract symptoms 06/25/2014   Bladder spasms 06/25/2014   Abnormal computed tomography of bladder 06/25/2014   Adenocarcinoma of prostate 06/25/2014   Hypogonadism in male 05/30/2014   Malignant neoplasm of prostate 01/09/2014   Past Medical History  Diagnosis Date   Hypertension    Chronic low back pain    Anxiety    Hypogonadism male    Depression    History of gastric ulcer     as child   ED (erectile dysfunction)    History of  colon polyps    Lumbar spine scoliosis    Wears glasses    At risk for sleep apnea     STOP-BANG= 4   SENT TO PCP 04-02-2014   Prostate cancer     Dr Tammi Klippel ( oncologist) and Dr Loel Lofty [facility])    Incomplete bladder emptying    Retention of urine, unspecified    H/O: depression    Sleep apnea    Premature ejaculation    Past Surgical History  Procedure Laterality Date   Prostate biopsy     Lumbar fusion  04/ 2012    and rod   Colonoscopy w/ polypectomy  july 2015   Appendectomy  age 89   Radioactive seed implant N/A 04/06/2014    Procedure: RADIOACTIVE SEED IMPLANT;  Surgeon: [name], MD;  Location: [facility];  Service: Urology;  Laterality: N/A;  dr portable    No Known Allergies Prior to Admission medications   Medication Sig Start Date End Date Taking? Authorizing Provider  enalapril-hydrochlorothiazide (VASERETIC) 10-25 MG per tablet take 1 tablet by mouth every morning 10/09/14  Yes [name], MD  finasteride (PROSCAR) 5 MG tablet Take 1 tablet (5 mg total) by mouth daily. 09/07/14  Yes [name], MD  gabapentin (NEURONTIN) 300 MG  capsule Take 1 capsule (300 mg total) by mouth 3 (three) times daily. 09/07/14  Yes [name], MD  HYDROcodone-acetaminophen (NORCO) 5-325 MG per tablet Take 1 tablet by mouth every 6 (six) hours as needed for moderate pain. 12/22/14  Yes [name], MD  Nutritional Supplements (NUTRITIONAL SUPPLEMENT PLUS PO) Take 1 capsule by mouth daily. Gets at health food store unsure of name of medication   Yes Historical Provider, MD  Phenazopyridine HCl (AZO URINARY PAIN PO) Take by mouth.   Yes Historical Provider, MD  sertraline (ZOLOFT) 100 MG tablet Take 1 tablet (100 mg total) by mouth daily. 07/10/14  Yes [name], MD  traZODone (DESYREL) 100 MG tablet Take 1 tablet (100 mg total) by mouth at bedtime. 08/17/14  Yes [name], MD  zolpidem (AMBIEN) 10 MG tablet Take 1 tablet (10  mg total) by mouth at bedtime as needed for sleep. 07/10/14  Yes [name], MD  ALPRAZolam Duanne Moron) 0.5 MG tablet take 1 tablet by mouth at bedtime if needed Patient not taking: Reported on 12/26/2014 07/10/14   [name], MD  cyclobenzaprine (FLEXERIL) 10 MG tablet Take 10 mg by mouth daily.    Historical Provider, MD  ibuprofen (ADVIL,MOTRIN) 800 MG tablet Take 800 mg by mouth 3 (three) times daily with meals.    Historical Provider, MD  lidocaine (XYLOCAINE) 2 % jelly Place 1 application into the urethra as needed. Patient not taking: Reported on 12/26/2014 08/30/14   Mancel Bale, PA-C  Meth-Hyo-M Bl-Na Phos-Ph Sal (URIBEL PO) Take by mouth.    Historical Provider, MD  sildenafil (VIAGRA) 100 MG tablet Take 0.5-1 tablets (50-100 mg total) by mouth daily as needed for erectile dysfunction. Patient not taking: Reported on 12/26/2014 08/17/14   [name], MD  silodosin (RAPAFLO) 8 MG CAPS capsule Take 8 mg by mouth daily with breakfast.    Historical Provider, MD   History   Social History   Marital Status: Divorced    Spouse Name: N/A   Number of Children: N/A   Years of Education: N/A   Occupational History   Not on file.   Social History Main Topics   Smoking status: Current Every Day Smoker -- 1.00 packs/day for 10 years    Types: Cigarettes, Cigars   Smokeless tobacco: Never Used     Comment: currently smokes 2 small cigar per day/  quit cigarettes 2011   Alcohol Use: 0.0 oz/week    0 Standard drinks or equivalent per week     Comment: occasional   Drug Use: No   Sexual Activity: No   Other Topics Concern   Not on file   Social History Narrative        Review of Systems  Constitutional: Negative for fever and chills.  Genitourinary: Positive for difficulty urinating.       Objective:   Physical Exam Filed Vitals:   12/26/14 1033  BP: 110/72  Pulse: 75  Temp: 97.7 F (36.5 C)  TempSrc: Oral  Resp: 18  Height: 5\' 10"  (1.778 m)    Weight: 195 lb 9.6 oz (88.724 kg)  SpO2: 96%   I discussed the patient's ongoing problem with catheterization. His a follow-up appointment with Tresa Endo in several weeks.       Assessment & Plan:   This chart was scribed in my presence and reviewed by me personally.    ICD-9-CM ICD-10-CM   1. Dysuria 788.1 R30.0 ciprofloxacin (CIPRO) 500 MG tablet  2. Prostate ca 185  C61   3. Depression 311 F32.9 traZODone (DESYREL) 100 MG tablet  4. Adjustment disorder with mixed anxiety and depressed mood 309.28 F43.23 sertraline (ZOLOFT) 100 MG tablet  5. BPH (benign prostatic hyperplasia) 600.00 N40.0 finasteride (PROSCAR) 5 MG tablet   Please see communications led her that I wrote today  Signed, [name], MD

## 2014-12-27 ENCOUNTER — Telehealth: Payer: Self-pay

## 2014-12-27 NOTE — Telephone Encounter (Signed)
Pt was seen yesterday 12/26/14 by Bess Harvest. He was given some medications and today he woke up with blisters all in his mouth. I advised him to come in asap. He can't tonight, he doesn't have someone to watch his son. He is going to come in tomorrow to see Bess Harvest. He would like a call back from someone  to give him advice on what he can do tonight. Please advise at [phone]

## 2014-12-28 ENCOUNTER — Ambulatory Visit (INDEPENDENT_AMBULATORY_CARE_PROVIDER_SITE_OTHER): Payer: Medicare HMO | Admitting: Family Medicine

## 2014-12-28 VITALS — BP 140/80 | HR 60 | Temp 98.0°F | Resp 16 | Ht 70.0 in | Wt 193.0 lb

## 2014-12-28 DIAGNOSIS — B370 Candidal stomatitis: Secondary | ICD-10-CM | POA: Diagnosis not present

## 2014-12-28 DIAGNOSIS — F329 Major depressive disorder, single episode, unspecified: Secondary | ICD-10-CM

## 2014-12-28 DIAGNOSIS — R339 Retention of urine, unspecified: Secondary | ICD-10-CM

## 2014-12-28 DIAGNOSIS — C61 Malignant neoplasm of prostate: Secondary | ICD-10-CM | POA: Diagnosis not present

## 2014-12-28 DIAGNOSIS — F32A Depression, unspecified: Secondary | ICD-10-CM

## 2014-12-28 LAB — GLUCOSE, POCT (MANUAL RESULT ENTRY): POC Glucose: 87 mg/dl (ref 70–99)

## 2014-12-28 LAB — POCT GLYCOSYLATED HEMOGLOBIN (HGB A1C): HEMOGLOBIN A1C: 5.3

## 2014-12-28 MED ORDER — NYSTATIN 100000 UNIT/ML MT SUSP: 5.0000 mL | Freq: Four times a day (QID) | OROMUCOSAL | Status: DC

## 2014-12-28 NOTE — Patient Instructions (Signed)
Thrush, Adult  Thrush, also called oral candidiasis, is a fungal infection that develops in the mouth and throat and on the tongue. It causes white patches to form on the mouth and tongue. Thrush is most common in older adults, but it can occur at any age.  Many cases of thrush are mild, but this infection can also be more serious. Thrush can be a recurring problem for people who have chronic illnesses or who take medicines that limit the body's ability to fight infection. Because these people have difficulty fighting infections, the fungus that causes thrush can spread throughout the body. This can cause life-threatening blood or organ infections. CAUSES  Thrush is usually caused by a yeast called Candida albicans. This fungus is normally present in small amounts in the mouth and on other mucous membranes. It usually causes no harm. However, when conditions are present that allow the fungus to grow uncontrolled, it invades surrounding tissues and becomes an infection. Less often, other Candida species can also lead to thrush.  RISK FACTORS Thrush is more likely to develop in the following people:  People with an impaired ability to fight infection (weakened immune system).   Older adults.   People with HIV.   People with diabetes.   People with dry mouth (xerostomia).   Pregnant women.   People with poor dental care, especially those who have false teeth.   People who use antibiotic medicines.  SIGNS AND SYMPTOMS  Thrush can be a mild infection that causes no symptoms. If symptoms develop, they may include:   A burning feeling in the mouth and throat. This can occur at the start of a thrush infection.   White patches that adhere to the mouth and tongue. The tissue around the patches may be red, raw, and painful. If rubbed (during tooth brushing, for example), the patches and the tissue of the mouth may bleed easily.   A bad taste in the mouth or difficulty tasting foods.    Cottony feeling in the mouth.   Pain during eating and swallowing. DIAGNOSIS  Your health care provider can usually diagnose thrush by looking in your mouth and asking you questions about your health.  TREATMENT  Medicines that help prevent the growth of fungi (antifungals) are the standard treatment for thrush. These medicines are either applied directly to the affected area (topical) or swallowed (oral). The treatment will depend on the severity of the condition.  Mild Thrush Mild cases of thrush may clear up with the use of an antifungal mouth rinse or lozenges. Treatment usually lasts about 14 days.  Moderate to Severe Thrush  More severe thrush infections that have spread to the esophagus are treated with an oral antifungal medicine. A topical antifungal medicine may also be used.   For some severe infections, a treatment period longer than 14 days may be needed.   Oral antifungal medicines are almost never used during pregnancy because the fetus may be harmed. However, if a pregnant woman has a rare, severe thrush infection that has spread to her blood, oral antifungal medicines may be used. In this case, the risk of harm to the mother and fetus from the severe thrush infection may be greater than the risk posed by the use of antifungal medicines.  Persistent or Recurrent Thrush For cases of thrush that do not go away or keep coming back, treatment may involve the following:   Treatment may be needed twice as long as the symptoms last.   Treatment will   include both oral and topical antifungal medicines.   People with weakened immune systems can take an antifungal medicine on a continuous basis to prevent thrush infections.  It is important to treat conditions that make you more likely to get thrush, such as diabetes or HIV.  HOME CARE INSTRUCTIONS   Only take over-the-counter or prescription medicine as directed by your health care provider. Talk to your health care  provider about an over-the-counter medicine called gentian violet, which kills bacteria and fungi.   Eat plain, unflavored yogurt as directed by your health care provider. Check the label to make sure the yogurt contains live cultures. This yogurt can help healthy bacteria grow in the mouth that can stop the growth of the fungus that causes thrush.   Try these measures to help reduce the discomfort of thrush:   Drink cold liquids such as water or iced tea.   Try flavored ice treats or frozen juices.   Eat foods that are easy to swallow, such as gelatin, ice cream, or custard.   If the patches in your mouth are painful, try drinking from a straw.   Rinse your mouth several times a day with a warm saltwater rinse. You can make the saltwater mixture with 1 tsp (6 g) of salt in 8 fl oz (0.2 L) of warm water.   If you wear dentures, remove the dentures before going to bed, brush them vigorously, and soak them in a cleaning solution as directed by your health care provider.   Women who are breastfeeding should clean their nipples with an antifungal medicine as directed by their health care provider. Dry the nipples after breastfeeding. Applying lanolin-containing body lotion may help relieve nipple soreness.  SEEK MEDICAL CARE IF:  Your symptoms are getting worse or are not improving within 7 days of starting treatment.   You have symptoms of spreading infection, such as white patches on the skin outside of the mouth.   You are nursing and you have redness, burning, or pain in the nipples that is not relieved with treatment.  MAKE SURE YOU:  Understand these instructions.  Will watch your condition.  Will get help right away if you are not doing well or get worse. Document Released: 05/12/2004 Document Revised: 06/07/2013 Document Reviewed: 03/20/2013 ExitCare Patient Information 2015 [employer]. This information is not intended to replace advice given to you by your  health care provider. Make sure you discuss any questions you have with your health care provider.  

## 2014-12-28 NOTE — Progress Notes (Signed)
Subjective:    Patient ID: Justin Dunn, male    DOB: [birth year], 54 y.o.   MRN: [id number]  12/28/2014  Tongue white with bumps   HPI This 54 y.o. male presents for evaluation of tongue film.    Prostate cancer with implant seeds on 03/2014.  Has suffered with frequent complications with urinary retention.  S/p repeat foley catheters.  UTI that went to R kidney; unable to walk due to CVA pain.  MRI with dye performed and found bladder is much thicker; went back to [facility]/Urology; s/p radiation seeds which is process.  Went for second opinion by Dr. Rosana Hoes for second opinion.  Went for [facility]; with self catheterization, feel the need to pass gas.   Bladder is healthy but cannot do anything because of 99 seeds implanted into prostate; small tumor size; PSA was 5.6.  Found prostate cancer early.  Must self cath multiple times per day.  Gets up at 3:00, 6:00am; four times per morning; has urgency to urinate.  Carries catheters everywhere.  Dr. Rosana Hoes states that must have had pre-existing condition.  Evaluated by Dr. Joseph Art two days ago.  Started in 05/2014 self cathing. Gets depressed.  No energy. Gets forgetful; mind not engaged.  Has girl friend but not interested.  Decreased appetite.  Drinking protein shakes.  Viagra is too expensive.  Prescribed Cipro, Flomax, finasteride, Zoloft by Dr. Joseph Art in past month.  Awoke with tongue lesions on sides of tongue. Worried about allergic reaction to various medications started this month.  Stopped abx yesterday due to tongue lesions.  Called yesterday and spoke with Barnett Applebaum.  White film on tongue as well.    Review of Systems  Constitutional: Negative for fever, chills, diaphoresis and fatigue.  HENT: Positive for mouth sores. Negative for congestion, dental problem, drooling, ear discharge, ear pain, facial swelling, postnasal drip, rhinorrhea, sinus pressure, sore throat, trouble swallowing and voice change.   Respiratory: Negative for shortness  of breath.   Endocrine: Negative for polydipsia, polyphagia and polyuria.  Genitourinary: Positive for frequency and difficulty urinating. Negative for dysuria, hematuria, flank pain, discharge, penile swelling, scrotal swelling, enuresis, genital sores, penile pain and testicular pain.  Psychiatric/Behavioral: Positive for dysphoric mood.    Past Medical History  Diagnosis Date  . Hypertension   . Chronic low back pain   . Anxiety   . Hypogonadism male   . Depression   . History of gastric ulcer     as child  . ED (erectile dysfunction)   . History of colon polyps   . Lumbar spine scoliosis   . Wears glasses   . At risk for sleep apnea     STOP-BANG= 4   SENT TO PCP 04-02-2014  . Prostate cancer     Dr Tammi Klippel ( oncologist) and Dr Loel Lofty [facility])   . Incomplete bladder emptying   . Retention of urine, unspecified   . H/O: depression   . Sleep apnea   . Premature ejaculation    Past Surgical History  Procedure Laterality Date  . Prostate biopsy    . Lumbar fusion  04/ 2012    and rod  . Colonoscopy w/ polypectomy  july 2015  . Appendectomy  age 27  . Radioactive seed implant N/A 04/06/2014    Procedure: RADIOACTIVE SEED IMPLANT;  Surgeon: [name], MD;  Location: [facility];  Service: Urology;  Laterality: N/A;  dr portable    No Known Allergies  History  Social History  . Marital Status: Divorced    Spouse Name: N/A  . Number of Children: N/A  . Years of Education: N/A   Occupational History  . Not on file.   Social History Main Topics  . Smoking status: Current Every Day Smoker -- 1.00 packs/day for 10 years    Types: Cigarettes, Cigars  . Smokeless tobacco: Never Used     Comment: currently smokes 2 small cigar per day/  quit cigarettes 2011  . Alcohol Use: 0.0 oz/week    0 Standard drinks or equivalent per week     Comment: occasional  . Drug Use: No  . Sexual Activity: No   Other Topics Concern  . Not on file    Social History Narrative       Objective:    BP 140/80 mmHg  Pulse 60  Temp(Src) 98 F (36.7 C) (Oral)  Resp 16  Ht 5\' 10"  (1.778 m)  Wt 193 lb (87.544 kg)  BMI 27.69 kg/m2  SpO2 98% Physical Exam  Constitutional: He is oriented to person, place, and time. He appears well-developed and well-nourished. No distress.  HENT:  Head: Normocephalic and atraumatic.  Mouth/Throat: No oropharyngeal exudate, posterior oropharyngeal edema, posterior oropharyngeal erythema or tonsillar abscesses.  White film along tongue and buccal mucosa and soft palate.  Eyes: Conjunctivae and EOM are normal. Pupils are equal, round, and reactive to light.  Neck: Normal range of motion. Neck supple. Carotid bruit is not present. No thyromegaly present.  Cardiovascular: Normal rate, regular rhythm, normal heart sounds and intact distal pulses.  Exam reveals no gallop and no friction rub.   No murmur heard. Pulmonary/Chest: Effort normal and breath sounds normal. He has no wheezes. He has no rales.  Lymphadenopathy:    He has no cervical adenopathy.  Neurological: He is alert and oriented to person, place, and time. No cranial nerve deficit.  Skin: Skin is warm and dry. No rash noted. He is not diaphoretic.  Psychiatric: He has a normal mood and affect. His behavior is normal.  Nursing note and vitals reviewed.       Assessment & Plan:   1. Thrush   2. Depression   3. Prostate cancer   4. Urinary retention    1. Thrush: New. Secondary to recent abx therapy; continue Cipro as prescribed; rx for Nystatin swish and swallow for ten days.  Normal glucose; obtain HIV. 2.  Depression: uncontrolled; recently started on Zoloft by Dr. Joseph Art; advised to continue medication; no SI/HI; counseling provided; face to face encounter for 20 minutes with 50% of time dedicated to counseling and coordination of care. 3.  Prostate cancer: managed by urology; counseling provided; has been struggling with urinary  retention for six months. 4. Urinary retention: persistent; continue self catheterization throughout the day; managed by urology.    Meds ordered this encounter  Medications  . nystatin (MYCOSTATIN) 100000 UNIT/ML suspension    Sig: Take 5 mLs (500,000 Units total) by mouth 4 (four) times daily.    Dispense:  200 mL    Refill:  0    No Follow-up on file.     Ranika Mcniel Elayne Guerin, M.D. [facility] [address] [place], [place]  [address] [phone] phone [phone] fax

## 2014-12-28 NOTE — Telephone Encounter (Signed)
It looks like the patient was seen today.

## 2014-12-29 LAB — HIV ANTIBODY (ROUTINE TESTING W REFLEX): HIV: NONREACTIVE

## 2015-01-16 ENCOUNTER — Encounter: Payer: Self-pay | Admitting: Family Medicine

## 2015-01-16 ENCOUNTER — Ambulatory Visit (INDEPENDENT_AMBULATORY_CARE_PROVIDER_SITE_OTHER): Payer: Medicare HMO | Admitting: Family Medicine

## 2015-01-16 VITALS — BP 138/86 | HR 75 | Temp 98.5°F | Resp 16 | Ht 70.0 in | Wt 195.0 lb

## 2015-01-16 DIAGNOSIS — C61 Malignant neoplasm of prostate: Secondary | ICD-10-CM | POA: Diagnosis not present

## 2015-01-16 DIAGNOSIS — G4700 Insomnia, unspecified: Secondary | ICD-10-CM

## 2015-01-16 DIAGNOSIS — F4323 Adjustment disorder with mixed anxiety and depressed mood: Secondary | ICD-10-CM

## 2015-01-16 DIAGNOSIS — B370 Candidal stomatitis: Secondary | ICD-10-CM | POA: Diagnosis not present

## 2015-01-16 DIAGNOSIS — R300 Dysuria: Secondary | ICD-10-CM | POA: Diagnosis not present

## 2015-01-16 DIAGNOSIS — R339 Retention of urine, unspecified: Secondary | ICD-10-CM

## 2015-01-16 DIAGNOSIS — F419 Anxiety disorder, unspecified: Secondary | ICD-10-CM | POA: Diagnosis not present

## 2015-01-16 DIAGNOSIS — I10 Essential (primary) hypertension: Secondary | ICD-10-CM | POA: Diagnosis not present

## 2015-01-16 DIAGNOSIS — N3289 Other specified disorders of bladder: Secondary | ICD-10-CM | POA: Diagnosis not present

## 2015-01-16 LAB — COMPREHENSIVE METABOLIC PANEL
ALK PHOS: 58 U/L (ref 39–117)
ALT: 13 U/L (ref 0–53)
AST: 17 U/L (ref 0–37)
Albumin: 4.2 g/dL (ref 3.5–5.2)
BILIRUBIN TOTAL: 0.3 mg/dL (ref 0.2–1.2)
BUN: 19 mg/dL (ref 6–23)
CO2: 28 meq/L (ref 19–32)
CREATININE: 0.93 mg/dL (ref 0.50–1.35)
Calcium: 9.1 mg/dL (ref 8.4–10.5)
Chloride: 104 mEq/L (ref 96–112)
Glucose, Bld: 83 mg/dL (ref 70–99)
Potassium: 4.7 mEq/L (ref 3.5–5.3)
SODIUM: 139 meq/L (ref 135–145)
Total Protein: 6.7 g/dL (ref 6.0–8.3)

## 2015-01-16 LAB — CBC WITH DIFFERENTIAL/PLATELET
BASOS ABS: 0 10*3/uL (ref 0.0–0.1)
BASOS PCT: 0 % (ref 0–1)
EOS ABS: 0.1 10*3/uL (ref 0.0–0.7)
EOS PCT: 1 % (ref 0–5)
HCT: 42.6 % (ref 39.0–52.0)
Hemoglobin: 14.3 g/dL (ref 13.0–17.0)
Lymphocytes Relative: 37 % (ref 12–46)
Lymphs Abs: 2.2 10*3/uL (ref 0.7–4.0)
MCH: 30.8 pg (ref 26.0–34.0)
MCHC: 33.6 g/dL (ref 30.0–36.0)
MCV: 91.8 fL (ref 78.0–100.0)
MPV: 9.4 fL (ref 8.6–12.4)
Monocytes Absolute: 0.5 10*3/uL (ref 0.1–1.0)
Monocytes Relative: 8 % (ref 3–12)
Neutro Abs: 3.2 10*3/uL (ref 1.7–7.7)
Neutrophils Relative %: 54 % (ref 43–77)
PLATELETS: 285 10*3/uL (ref 150–400)
RBC: 4.64 MIL/uL (ref 4.22–5.81)
RDW: 14.6 % (ref 11.5–15.5)
WBC: 6 10*3/uL (ref 4.0–10.5)

## 2015-01-16 MED ORDER — HYDROCODONE-ACETAMINOPHEN 5-325 MG PO TABS: 1.0000 | ORAL_TABLET | Freq: Four times a day (QID) | ORAL | Status: DC | PRN

## 2015-01-16 MED ORDER — FLUCONAZOLE 100 MG PO TABS
ORAL_TABLET | ORAL | Status: DC
Start: 2015-01-16 — End: 2015-04-10

## 2015-01-16 MED ORDER — NYSTATIN 100000 UNIT/ML MT SUSP: 5.0000 mL | Freq: Four times a day (QID) | OROMUCOSAL | Status: DC

## 2015-01-16 MED ORDER — ZOLPIDEM TARTRATE 10 MG PO TABS: 10.0000 mg | ORAL_TABLET | Freq: Every evening | ORAL | Status: DC | PRN

## 2015-01-16 NOTE — Patient Instructions (Signed)
Generalized Anxiety Disorder Generalized anxiety disorder (GAD) is a mental disorder. It interferes with life functions, including relationships, work, and school. GAD is different from normal anxiety, which everyone experiences at some point in their lives in response to specific life events and activities. Normal anxiety actually helps us prepare for and get through these life events and activities. Normal anxiety goes away after the event or activity is over.  GAD causes anxiety that is not necessarily related to specific events or activities. It also causes excess anxiety in proportion to specific events or activities. The anxiety associated with GAD is also difficult to control. GAD can vary from mild to severe. People with severe GAD can have intense waves of anxiety with physical symptoms (panic attacks).  SYMPTOMS The anxiety and worry associated with GAD are difficult to control. This anxiety and worry are related to many life events and activities and also occur more days than not for 6 months or longer. People with GAD also have three or more of the following symptoms (one or more in children):  Restlessness.   Fatigue.  Difficulty concentrating.   Irritability.  Muscle tension.  Difficulty sleeping or unsatisfying sleep. DIAGNOSIS GAD is diagnosed through an assessment by your health care provider. Your health care provider will ask you questions aboutyour mood,physical symptoms, and events in your life. Your health care provider may ask you about your medical history and use of alcohol or drugs, including prescription medicines. Your health care provider may also do a physical exam and blood tests. Certain medical conditions and the use of certain substances can cause symptoms similar to those associated with GAD. Your health care provider may refer you to a mental health specialist for further evaluation. TREATMENT The following therapies are usually used to treat GAD:    Medication. Antidepressant medication usually is prescribed for long-term daily control. Antianxiety medicines may be added in severe cases, especially when panic attacks occur.   Talk therapy (psychotherapy). Certain types of talk therapy can be helpful in treating GAD by providing support, education, and guidance. A form of talk therapy called cognitive behavioral therapy can teach you healthy ways to think about and react to daily life events and activities.  Stress managementtechniques. These include yoga, meditation, and exercise and can be very helpful when they are practiced regularly. A mental health specialist can help determine which treatment is best for you. Some people see improvement with one therapy. However, other people require a combination of therapies. Document Released: 12/12/2012 Document Revised: 01/01/2014 Document Reviewed: 12/12/2012 ExitCare Patient Information 2015 [employer]. This information is not intended to replace advice given to you by your health care provider. Make sure you discuss any questions you have with your health care provider.  

## 2015-01-16 NOTE — Progress Notes (Signed)
Subjective:    Patient ID: Justin Dunn, male    DOB: 12/09/1960, 54 y.o.   MRN: [id number]  01/16/2015  Follow-up; Anxiety; Medication Refill; and tingling in right arm and fingers   HPI This 54 y.o. male presents for evaluation of the following:   1. Anxiety and depression: evaluated by Dr. Joseph Art 11/2014; prescribed Zoloft 100mg  daily.  Tolerating Zoloft well without side effects; no improvement in anxiety yet on Zoloft.  Very anxious about overall health and well-being.  Very frustrated by persistent self-cathing due to urinary retention.     2. Insomnia: Requesting refill of Ambien 10mg  qhs and Trazodone 100mg  qhs.  Still struggles to sleep at times; suffering with significant anxiety.  3. Thrush: 50% improved from last visit but still suffering with white film on tongue and slightly raw sensation of tongue.   Using Nystatin qid; gargling and rinsing; still has lesion along tongue; less white film with Nystatin.  3.  Urinary retention: still self-catheterizing daily.  No incontinence. Self cath in morning.  Around 3:00pm, has night sweats; then witih self catheterization, feels relief of catherizing.  Later in morning three hours later, will empty bladder but then has burning sesnation in suprapubic region.  Uses hydrocodone due to pain with self catheterization.  Severe burning, irritating feeling with self catheterizing.  With having bowel movement, will trickle some urine.  Must self catheterize ten times per day; the most urine is upon awakening; 1 cup of urine upon awakening.  Goes to bed t 9:00pm; then by 3:00am must awaken, having horrible burning sensation with urgency.  Must take deep breath; bladder is now pushing.  Repeat catheterization around 8:30am.  Repeat 12, 4, 7, 9.  Last urology evaluation Dr. Rosana Hoes next week at [facility].  Last visit with Dr. Rosana Hoes one month ago.  S/p bladder studies/VCUG.  Finished abx Cipro; still not comfortable.  Using Hydrocodone qid most  days.  4.  L arm numbness to finger: intermittent issue while laying in bed.  Onset month ago.  No neck pain.  All fingers numbness; will occur with laying in certain position.  Must change positions to relieve numbness.  If doing things around the house, intermittent numbness.  No associated neck pain.   5.  Prostate cancer: s/p seed implantation in 03/2014 by [facility]; s/p radiation oncology evaluation 03/2014.  Cautioned against having small children around pt.  Now only followed by urology; concerned that may need follow-up with radiation oncology.     Review of Systems  Constitutional: Negative for fever, chills, diaphoresis, activity change, appetite change and fatigue.  HENT: Positive for mouth sores. Negative for congestion, ear pain, postnasal drip, rhinorrhea, sore throat, tinnitus, trouble swallowing and voice change.   Respiratory: Negative for cough and shortness of breath.   Cardiovascular: Negative for chest pain, palpitations and leg swelling.  Gastrointestinal: Negative for nausea, vomiting, abdominal pain, diarrhea and constipation.  Endocrine: Negative for cold intolerance, heat intolerance, polydipsia, polyphagia and polyuria.  Genitourinary: Positive for dysuria, urgency, frequency, decreased urine volume, difficulty urinating and penile pain. Negative for hematuria, flank pain, discharge, penile swelling, scrotal swelling, enuresis, genital sores and testicular pain.  Musculoskeletal: Positive for back pain. Negative for neck pain and neck stiffness.  Skin: Negative for color change, rash and wound.  Neurological: Positive for numbness. Negative for dizziness, tremors, seizures, syncope, facial asymmetry, speech difficulty, weakness, light-headedness and headaches.  Psychiatric/Behavioral: Negative for sleep disturbance and dysphoric mood. The patient is not nervous/anxious.  Past Medical History  Diagnosis Date  . Hypertension   . Chronic low back pain   . Anxiety     . Hypogonadism male   . Depression   . History of gastric ulcer     as child  . ED (erectile dysfunction)   . History of colon polyps   . Lumbar spine scoliosis   . Wears glasses   . At risk for sleep apnea     STOP-BANG= 4   SENT TO PCP 04-02-2014  . Prostate cancer     Dr Tammi Klippel ( oncologist) and Dr Loel Lofty [facility])   . Incomplete bladder emptying   . Retention of urine, unspecified   . H/O: depression   . Sleep apnea   . Premature ejaculation    Past Surgical History  Procedure Laterality Date  . Prostate biopsy    . Lumbar fusion  04/ 2012    and rod  . Colonoscopy w/ polypectomy  july 2015  . Appendectomy  age 15  . Radioactive seed implant N/A 04/06/2014    Procedure: RADIOACTIVE SEED IMPLANT;  Surgeon: [name], MD;  Location: [facility];  Service: Urology;  Laterality: N/A;  dr portable    No Known Allergies Current Outpatient Prescriptions  Medication Sig Dispense Refill  . enalapril-hydrochlorothiazide (VASERETIC) 10-25 MG per tablet take 1 tablet by mouth every morning 90 tablet 1  . finasteride (PROSCAR) 5 MG tablet Take 1 tablet (5 mg total) by mouth daily. 30 tablet 5  . gabapentin (NEURONTIN) 300 MG capsule Take 1 capsule (300 mg total) by mouth 3 (three) times daily. 90 capsule 6  . HYDROcodone-acetaminophen (NORCO) 5-325 MG per tablet Take 1 tablet by mouth every 6 (six) hours as needed for moderate pain. 30 tablet 0  . Nutritional Supplements (NUTRITIONAL SUPPLEMENT PLUS PO) Take 1 capsule by mouth daily. Gets at health food store unsure of name of medication    . nystatin (MYCOSTATIN) 100000 UNIT/ML suspension Take 5 mLs (500,000 Units total) by mouth 4 (four) times daily. 200 mL 0  . OVER THE COUNTER MEDICATION OTC Tumeric 300 mg taking daily    . Phenazopyridine HCl (AZO URINARY PAIN PO) Take by mouth.    . sertraline (ZOLOFT) 100 MG tablet Take 1 tablet (100 mg total) by mouth daily. 90 tablet 3  . traZODone (DESYREL) 100  MG tablet Take 1 tablet (100 mg total) by mouth at bedtime. 90 tablet 3  . zolpidem (AMBIEN) 10 MG tablet Take 1 tablet (10 mg total) by mouth at bedtime as needed for sleep. 30 tablet 3  . fluconazole (DIFLUCAN) 100 MG tablet Two tablets daily x 1 day then one tablet daily x 6 days for thrush 8 tablet 0  . sildenafil (VIAGRA) 100 MG tablet Take 0.5-1 tablets (50-100 mg total) by mouth daily as needed for erectile dysfunction. (Patient not taking: Reported on 01/16/2015) 6 tablet 11   No current facility-administered medications for this visit.       Objective:    BP 138/86 mmHg  Pulse 75  Temp(Src) 98.5 F (36.9 C) (Oral)  Resp 16  Ht 5\' 10"  (1.778 m)  Wt 195 lb (88.451 kg)  BMI 27.98 kg/m2  SpO2 99% Physical Exam  Constitutional: He is oriented to person, place, and time. He appears well-developed and well-nourished. No distress.  HENT:  Head: Normocephalic and atraumatic.  Right Ear: External ear normal.  Left Ear: External ear normal.  Nose: Nose normal.  Mouth/Throat:  Oropharynx is clear and moist.  White thin film along tongue less prominent than previous visit; persistent white film along B buccal mucosa; R lateral tongue with annular ulcerative lesions.  Eyes: Conjunctivae and EOM are normal. Pupils are equal, round, and reactive to light.  Neck: Normal range of motion. Neck supple. Carotid bruit is not present. No thyromegaly present.  Cardiovascular: Normal rate, regular rhythm, normal heart sounds and intact distal pulses.  Exam reveals no gallop and no friction rub.   No murmur heard. Pulmonary/Chest: Effort normal and breath sounds normal. He has no wheezes. He has no rales.  Abdominal: Soft. Bowel sounds are normal. He exhibits no distension and no mass. There is no tenderness. There is no rebound and no guarding.  Lymphadenopathy:    He has no cervical adenopathy.  Neurological: He is alert and oriented to person, place, and time. No cranial nerve deficit.  Skin:  Skin is warm and dry. No rash noted. He is not diaphoretic.  Psychiatric: He has a normal mood and affect. His behavior is normal.  Nursing note and vitals reviewed.  Results for orders placed or performed in visit on 12/28/14  HIV antibody  Result Value Ref Range   HIV 1&2 Ab, 4th Generation NONREACTIVE NONREACTIVE  POCT glucose (manual entry)  Result Value Ref Range   POC Glucose 87 70 - 99 mg/dl  POCT glycosylated hemoglobin (Hb A1C)  Result Value Ref Range   Hemoglobin A1C 5.3        Assessment & Plan:   1. Anxiety   2. Bladder spasms   3. Insomnia   4. Dysuria   5. Essential hypertension, benign   6. Adenocarcinoma of prostate   7. Malignant neoplasm of prostate   8. Urinary retention   9. Thrush     1. Anxiety: persistent; uncontrolled currently; continue Zoloft 100mg  daily at this time with plan to increase to 150-200mg  daily at next visit. 2.  Insomnia: moderately controlled; refill of Ambien 10mg  qhs provided; continue Trazodone 100mg  qhs. 3.  Bladder spasms/dysuria: persistent; occurs with urgency and with self catheterization; refill of Hydrocodone provided; recommend discussing with Dr. Rosana Hoes at visit next visit; do not recommend long term narcotic use for urinary discomfort. 4.  HTN: controlled; obtain labs; continue current medications. 5.  Prostate cancer: stable; s/p seed implantation; followed by urology.  Appointment next week with urology/[facility]. 6. Urinary retention: persistent; follow-up with urology next week; continue self catheterization. 7. Thrush: persistent; but improved; refill of Nystatin; rx for Diflucan provided.  Glucose and HIV negative.    Meds ordered this encounter  Medications  . OVER THE COUNTER MEDICATION    Sig: OTC Tumeric 300 mg taking daily  . HYDROcodone-acetaminophen (NORCO) 5-325 MG per tablet    Sig: Take 1 tablet by mouth every 6 (six) hours as needed for moderate pain.    Dispense:  30 tablet    Refill:  0  . zolpidem  (AMBIEN) 10 MG tablet    Sig: Take 1 tablet (10 mg total) by mouth at bedtime as needed for sleep.    Dispense:  30 tablet    Refill:  3  . fluconazole (DIFLUCAN) 100 MG tablet    Sig: Two tablets daily x 1 day then one tablet daily x 6 days for thrush    Dispense:  8 tablet    Refill:  0  . nystatin (MYCOSTATIN) 100000 UNIT/ML suspension    Sig: Take 5 mLs (500,000 Units total) by mouth 4 (four)  times daily.    Dispense:  200 mL    Refill:  0    Return in about 4 weeks (around 02/13/2015) for recheck anxiety, insomnia.     Hind Chesler Elayne Guerin, M.D. [facility] [address] [place], [place]  [address] [phone] phone [phone] fax

## 2015-01-17 MED ORDER — SERTRALINE HCL 100 MG PO TABS: 200.0000 mg | ORAL_TABLET | Freq: Every day | ORAL | Status: DC

## 2015-01-17 NOTE — Addendum Note (Signed)
Addended by: Wardell Honour on: 01/17/2015 10:32 AM   Modules accepted: Orders

## 2015-01-18 ENCOUNTER — Encounter: Payer: Self-pay | Admitting: Family Medicine

## 2015-02-07 DIAGNOSIS — I10 Essential (primary) hypertension: Secondary | ICD-10-CM | POA: Insufficient documentation

## 2015-03-19 ENCOUNTER — Telehealth: Payer: Self-pay

## 2015-03-19 NOTE — Telephone Encounter (Signed)
Call --- sweating and chills may be due to Tramadol; thus, would recommend STOPPING Tramadol at this time to see if sweats and chills resolve.  Is he running fever? Has he checked his temperature?  If not, advise pt to check temperature to confirm that he is not running a fever.

## 2015-03-19 NOTE — Telephone Encounter (Signed)
This message is directed to Dr. Tamala Julian. Pt came in wanting her to know he just started taking Tramadol yesterday and this morning(7/19) he woke up covered in sweat. He is having hot and cold flashes. He is also having strange sensations when he goes to the bathroom. Please advise at [phone]

## 2015-03-19 NOTE — Telephone Encounter (Signed)
Possible side effects to the medication? Please advise.

## 2015-03-20 ENCOUNTER — Ambulatory Visit (INDEPENDENT_AMBULATORY_CARE_PROVIDER_SITE_OTHER): Payer: Medicare HMO | Admitting: Emergency Medicine

## 2015-03-20 VITALS — BP 136/80 | HR 85 | Temp 98.6°F | Resp 16 | Ht 71.0 in | Wt 188.8 lb

## 2015-03-20 DIAGNOSIS — R339 Retention of urine, unspecified: Secondary | ICD-10-CM

## 2015-03-20 DIAGNOSIS — N3001 Acute cystitis with hematuria: Secondary | ICD-10-CM

## 2015-03-20 LAB — POCT UA - MICROSCOPIC ONLY
CASTS, UR, LPF, POC: NEGATIVE
Crystals, Ur, HPF, POC: NEGATIVE
Mucus, UA: NEGATIVE
YEAST UA: NEGATIVE

## 2015-03-20 LAB — POCT URINALYSIS DIPSTICK
BILIRUBIN UA: NEGATIVE
Glucose, UA: NEGATIVE
Ketones, UA: NEGATIVE
NITRITE UA: NEGATIVE
PH UA: 6
Spec Grav, UA: <=1.005
Urobilinogen, UA: 0.2

## 2015-03-20 MED ORDER — SULFAMETHOXAZOLE-TRIMETHOPRIM 800-160 MG PO TABS: 1.0000 | ORAL_TABLET | Freq: Two times a day (BID) | ORAL | Status: DC

## 2015-03-20 NOTE — Telephone Encounter (Signed)
Spoke with pt, he is not running a fever to his knowledge but has not taken his temperature. He just feels cold. He agreed to come into the walk in clinic to see another provider since Dr. Tamala Julian is not in the clinic.

## 2015-03-20 NOTE — Progress Notes (Signed)
Subjective:  Patient ID: Justin Dunn, male    DOB: Jan 09, 1961  Age: 54 y.o. MRN: [id number]  CC: Urinary Tract Infection   HPI RUAIRI STUTSMAN presents  patient had a procedure involving transurethral insertion of radioactive seeds in his prostate and some form of duraplasty. Since that time he's been having intermittent periods of urinary retention and dysuria. He now has dysuria and is unable to completely empty his bladder requiring him to do self cath several times a day. He has no fever chills nausea vomiting or back pain. Has some blood in his urine.  History Justin Dunn has a past medical history of Hypertension; Chronic low back pain; Anxiety; Hypogonadism male; Depression; History of gastric ulcer; ED (erectile dysfunction); History of colon polyps; Lumbar spine scoliosis; Wears glasses; At risk for sleep apnea; Prostate cancer; Incomplete bladder emptying; Retention of urine, unspecified; H/O: depression; Sleep apnea; and Premature ejaculation.   He has past surgical history that includes Prostate biopsy; Lumbar fusion (04/ 2012); Colonoscopy w/ polypectomy (july 2015); Appendectomy (age 13); and Radioactive seed implant (N/A, 04/06/2014).   His  family history includes Cancer in his father.  He   reports that he has been smoking Cigarettes and Cigars.  He has a 10 pack-year smoking history. He has never used smokeless tobacco. He reports that he drinks alcohol. He reports that he does not use illicit drugs.  Outpatient Prescriptions Prior to Visit  Medication Sig Dispense Refill  . enalapril-hydrochlorothiazide (VASERETIC) 10-25 MG per tablet take 1 tablet by mouth every morning 90 tablet 1  . gabapentin (NEURONTIN) 300 MG capsule Take 1 capsule (300 mg total) by mouth 3 (three) times daily. 90 capsule 6  . Nutritional Supplements (NUTRITIONAL SUPPLEMENT PLUS PO) Take 1 capsule by mouth daily. Gets at health food store unsure of name of medication    . nystatin (MYCOSTATIN) 100000  UNIT/ML suspension Take 5 mLs (500,000 Units total) by mouth 4 (four) times daily. 200 mL 0  . sertraline (ZOLOFT) 100 MG tablet Take 2 tablets (200 mg total) by mouth daily. 180 tablet 3  . traZODone (DESYREL) 100 MG tablet Take 1 tablet (100 mg total) by mouth at bedtime. 90 tablet 3  . zolpidem (AMBIEN) 10 MG tablet Take 1 tablet (10 mg total) by mouth at bedtime as needed for sleep. 30 tablet 3  . finasteride (PROSCAR) 5 MG tablet Take 1 tablet (5 mg total) by mouth daily. (Patient not taking: Reported on 03/20/2015) 30 tablet 5  . fluconazole (DIFLUCAN) 100 MG tablet Two tablets daily x 1 day then one tablet daily x 6 days for thrush (Patient not taking: Reported on 03/20/2015) 8 tablet 0  . HYDROcodone-acetaminophen (NORCO) 5-325 MG per tablet Take 1 tablet by mouth every 6 (six) hours as needed for moderate pain. (Patient not taking: Reported on 03/20/2015) 30 tablet 0  . OVER THE COUNTER MEDICATION OTC Tumeric 300 mg taking daily    . Phenazopyridine HCl (AZO URINARY PAIN PO) Take by mouth.    . sildenafil (VIAGRA) 100 MG tablet Take 0.5-1 tablets (50-100 mg total) by mouth daily as needed for erectile dysfunction. (Patient not taking: Reported on 01/16/2015) 6 tablet 11   No facility-administered medications prior to visit.    History   Social History  . Marital Status: Divorced    Spouse Name: N/A  . Number of Children: N/A  . Years of Education: N/A   Social History Main Topics  . Smoking status: Current Some Day Smoker --  1.00 packs/day for 10 years    Types: Cigarettes, Cigars  . Smokeless tobacco: Never Used     Comment: currently smokes 2 small cigar per day/  quit cigarettes 2011  . Alcohol Use: 0.0 oz/week    0 Standard drinks or equivalent per week     Comment: occasional  . Drug Use: No  . Sexual Activity: No   Other Topics Concern  . None   Social History Narrative     Review of Systems  Constitutional: Negative for fever, chills and appetite change.  HENT:  Negative for congestion, ear pain, postnasal drip, sinus pressure and sore throat.   Eyes: Negative for pain and redness.  Respiratory: Negative for cough, shortness of breath and wheezing.   Cardiovascular: Negative for leg swelling.  Gastrointestinal: Negative for nausea, vomiting, abdominal pain, diarrhea, constipation and blood in stool.  Endocrine: Negative for polyuria.  Genitourinary: Positive for dysuria, frequency, hematuria and difficulty urinating. Negative for urgency and flank pain.  Musculoskeletal: Negative for gait problem.  Skin: Negative for rash.  Neurological: Negative for weakness and headaches.  Psychiatric/Behavioral: Negative for confusion and decreased concentration. The patient is not nervous/anxious.     Objective:  BP 136/80 mmHg  Pulse 85  Temp(Src) 98.6 F (37 C) (Oral)  Resp 16  Ht 5\' 11"  (1.803 m)  Wt 188 lb 12.8 oz (85.639 kg)  BMI 26.34 kg/m2  SpO2 98%  Physical Exam  Constitutional: He is oriented to person, place, and time. He appears well-developed and well-nourished.  HENT:  Head: Normocephalic and atraumatic.  Eyes: Conjunctivae are normal. Pupils are equal, round, and reactive to light.  Pulmonary/Chest: Effort normal.  Musculoskeletal: He exhibits no edema.  Neurological: He is alert and oriented to person, place, and time.  Skin: Skin is dry.  Psychiatric: He has a normal mood and affect. His behavior is normal. Thought content normal.      Assessment & Plan:   Justin Dunn was seen today for urinary tract infection.  Diagnoses and all orders for this visit:  Acute cystitis with hematuria Orders: -     POCT UA - Microscopic Only -     POCT urinalysis dipstick -     Urine culture  Urinary retention Orders: -     POCT UA - Microscopic Only -     POCT urinalysis dipstick  Other orders -     sulfamethoxazole-trimethoprim (BACTRIM DS,SEPTRA DS) 800-160 MG per tablet; Take 1 tablet by mouth 2 (two) times daily.   I am having Mr.  Kotlyar start on sulfamethoxazole-trimethoprim. I am also having him maintain his Nutritional Supplements (NUTRITIONAL SUPPLEMENT PLUS PO), Phenazopyridine HCl (AZO URINARY PAIN PO), sildenafil, gabapentin, enalapril-hydrochlorothiazide, traZODone, finasteride, OVER THE COUNTER MEDICATION, HYDROcodone-acetaminophen, zolpidem, fluconazole, nystatin, sertraline, traMADol, lidocaine, ciprofloxacin, and TURMERIC PO.  Meds ordered this encounter  Medications  . traMADol (ULTRAM) 50 MG tablet    Sig: Take by mouth every 6 (six) hours as needed.  . lidocaine (XYLOCAINE) 2 % jelly    Sig: 1 application as needed.  . ciprofloxacin (CIPRO) 500 MG/5ML (10%) suspension    Sig: Take by mouth 2 (two) times daily.  . TURMERIC PO    Sig: Take by mouth every morning.  . sulfamethoxazole-trimethoprim (BACTRIM DS,SEPTRA DS) 800-160 MG per tablet    Sig: Take 1 tablet by mouth 2 (two) times daily.    Dispense:  20 tablet    Refill:  0    Appropriate red flag conditions were discussed with the patient  as well as actions that should be taken.  Patient expressed his understanding. I instructed her follow-up with surgery his urologist and I wait the culture report.  Follow-up: Return if symptoms worsen or fail to improve.  Roselee Culver, MD   Results for orders placed or performed in visit on 03/20/15  POCT UA - Microscopic Only  Result Value Ref Range   WBC, Ur, HPF, POC 0-3    RBC, urine, microscopic 0-3    Bacteria, U Microscopic trace    Mucus, UA negative    Epithelial cells, urine per micros 0-2    Crystals, Ur, HPF, POC negative    Casts, Ur, LPF, POC negative    Yeast, UA negative   POCT urinalysis dipstick  Result Value Ref Range   Color, UA yellow    Clarity, UA clear    Glucose, UA negative    Bilirubin, UA negative    Ketones, UA negative    Spec Grav, UA <=1.005    Blood, UA moderate    pH, UA 6.0    Protein, UA trace    Urobilinogen, UA 0.2    Nitrite, UA negative     Leukocytes, UA small (1+) (A) Negative

## 2015-03-20 NOTE — Patient Instructions (Signed)

## 2015-03-21 NOTE — Telephone Encounter (Signed)
Noted  

## 2015-03-22 ENCOUNTER — Telehealth: Payer: Self-pay

## 2015-03-22 LAB — URINE CULTURE
Colony Count: NO GROWTH
ORGANISM ID, BACTERIA: NO GROWTH

## 2015-03-22 NOTE — Telephone Encounter (Signed)
Pt was just seen at [facility] and they called to see if we can fax them his urine culture results. I advised they have not resulted yet, but that I will send a message to the Lab and MD to see if they can fax them when result. Fax # [phone].

## 2015-03-25 ENCOUNTER — Ambulatory Visit: Payer: Self-pay | Admitting: Family Medicine

## 2015-03-25 NOTE — Telephone Encounter (Signed)
I have faxed results.

## 2015-03-27 ENCOUNTER — Telehealth: Payer: Self-pay

## 2015-03-27 NOTE — Telephone Encounter (Signed)
Patient would like lab results. Please call! [phone]

## 2015-03-29 NOTE — Telephone Encounter (Signed)
See labs 

## 2015-04-10 ENCOUNTER — Ambulatory Visit (INDEPENDENT_AMBULATORY_CARE_PROVIDER_SITE_OTHER): Payer: Medicare HMO | Admitting: Family Medicine

## 2015-04-10 ENCOUNTER — Encounter: Payer: Self-pay | Admitting: Family Medicine

## 2015-04-10 VITALS — BP 140/85 | HR 89 | Temp 98.8°F | Resp 16 | Ht 71.0 in | Wt 184.8 lb

## 2015-04-10 DIAGNOSIS — F329 Major depressive disorder, single episode, unspecified: Secondary | ICD-10-CM

## 2015-04-10 DIAGNOSIS — F32A Depression, unspecified: Secondary | ICD-10-CM

## 2015-04-10 DIAGNOSIS — G4700 Insomnia, unspecified: Secondary | ICD-10-CM | POA: Diagnosis not present

## 2015-04-10 DIAGNOSIS — R300 Dysuria: Secondary | ICD-10-CM

## 2015-04-10 DIAGNOSIS — N3289 Other specified disorders of bladder: Secondary | ICD-10-CM | POA: Diagnosis not present

## 2015-04-10 DIAGNOSIS — C61 Malignant neoplasm of prostate: Secondary | ICD-10-CM | POA: Diagnosis not present

## 2015-04-10 MED ORDER — OXYCODONE HCL 5 MG PO TABS: 5.0000 mg | ORAL_TABLET | Freq: Three times a day (TID) | ORAL | Status: DC | PRN

## 2015-04-10 MED ORDER — TRAZODONE HCL 100 MG PO TABS: 100.0000 mg | ORAL_TABLET | Freq: Every day | ORAL | Status: DC

## 2015-04-10 MED ORDER — ZOLPIDEM TARTRATE 10 MG PO TABS: 10.0000 mg | ORAL_TABLET | Freq: Every evening | ORAL | Status: DC | PRN

## 2015-04-10 NOTE — Patient Instructions (Signed)
1. STOP TRAMADOL. 2.  STOP HYDROCODONE. 3.  INCREASE GABAPENTIN 300MG  ONE TABLET THREE TIMES DAILY.   4. USE OXYCODONE FOR SEVERE PAIN.

## 2015-04-10 NOTE — Progress Notes (Signed)
Subjective:    Patient ID: Justin Dunn, male    DOB: 09-06-1960, 54 y.o.   MRN: [id number]  04/10/2015  Anxiety; Depression; Insomnia; Medication Refill; and painful urination   HPI This 54 y.o. male presents for three month follow-up:  1.  Prostate cancer:  Dr. Rosana Hoes went into prostate and did incision to allow pt to urinate (TUIP on 03/05/2015); pt able to urinate; but not able to empty bladder.  Able to get a little urine out; procedure completed on 03/06/15.  Wearing Depends.  Leaking some.  Not having to self cath.  Saw Dr. Rosana Hoes on Monday of this week.  Requesting pain medication; with urination suffers with burning in rectum to urethra.  Must stand there and hold self. Unable to sleep due to sadness.  Not getting a full diagnosis of prostate issues.  Looked at medications that have been prescribed; Sertraline can cause urinary retention.  Was taking Tramadol but was not providing relief.  Also taking Aleve with Tramadol.  Recommended pharmacist is strictly oxycodone without tylenol.  Pharmacist recommended prescribing only oxycodone.  Still has not heard from Dr. Johny Shears office; no follow-up. AZO provides no relief.  Previous took Flomax with initial surgery; stopped Flomax for one month after surgery.  No improvement with Flomax.  Frustrated by delay in recent surgery.  No other trial of medication for bladder spasm.  Recommended another six months; wants to follow-up with Rosana Hoes in eight weeks; sees him on 05/20/15.    2.  Depression and anxiety: not sleeping well.  Very down about prostate issues.   Dr. Rosana Hoes was worried that Sertraline can cause retention urinary; not focused.  There are things that need to be done but no motivation. May start something and get totally distracted.  No SI/HI.    3.  DDD lumbar spine: taking Gabapentin 300mg  bid.  Suffering with back pain; especially with repetitive movement; suffers with sharp pain going into lower back.  Lumbar fusion in 2012 by Cohen.     4. Hypogonadism: was prescribed Testosterone by previous physician; Dr. Joseph Art stopped Testosterone due to rising PSA; father with prostate cancer and uncle with prostate cancer.     Review of Systems  Constitutional: Negative for fever, chills, diaphoresis, activity change, appetite change and fatigue.  Eyes: Negative for visual disturbance.  Respiratory: Negative for cough and shortness of breath.   Cardiovascular: Negative for chest pain, palpitations and leg swelling.  Endocrine: Negative for cold intolerance, heat intolerance, polydipsia, polyphagia and polyuria.  Genitourinary: Positive for urgency, frequency, penile pain and testicular pain. Negative for dysuria, hematuria, flank pain, decreased urine volume, discharge, penile swelling and genital sores.  Musculoskeletal: Positive for back pain.  Neurological: Negative for dizziness, tremors, seizures, syncope, facial asymmetry, speech difficulty, weakness, light-headedness, numbness and headaches.  Psychiatric/Behavioral: Positive for sleep disturbance and dysphoric mood. Negative for suicidal ideas and self-injury. The patient is nervous/anxious.     Past Medical History  Diagnosis Date  . Hypertension   . Chronic low back pain   . Anxiety   . Hypogonadism male   . Depression   . History of gastric ulcer     as child  . ED (erectile dysfunction)   . History of colon polyps   . Lumbar spine scoliosis   . Wears glasses   . At risk for sleep apnea     STOP-BANG= 4   SENT TO PCP 04-02-2014  . Prostate cancer     Dr Tammi Klippel ( oncologist) and  Dr Loel Lofty [facility])   . Incomplete bladder emptying   . Retention of urine, unspecified   . H/O: depression   . Sleep apnea   . Premature ejaculation    Past Surgical History  Procedure Laterality Date  . Prostate biopsy    . Lumbar fusion  04/ 2012    and rod  . Colonoscopy w/ polypectomy  july 2015  . Appendectomy  age 54  . Radioactive seed implant N/A 04/06/2014     Procedure: RADIOACTIVE SEED IMPLANT;  Surgeon: [name], MD;  Location: [facility];  Service: Urology;  Laterality: N/A;  dr portable    No Known Allergies Social History   Social History  . Marital Status: Divorced    Spouse Name: N/A  . Number of Children: N/A  . Years of Education: N/A   Occupational History  . Not on file.   Social History Main Topics  . Smoking status: Current Some Day Smoker -- 1.00 packs/day for 10 years    Types: Cigarettes, Cigars  . Smokeless tobacco: Never Used     Comment: currently smokes 2 small cigar per day/  quit cigarettes 2011  . Alcohol Use: 0.0 oz/week    0 Standard drinks or equivalent per week     Comment: occasional  . Drug Use: No  . Sexual Activity: No   Other Topics Concern  . Not on file   Social History Narrative   Family History  Problem Relation Age of Onset  . Cancer Father     prostate        Objective:    BP 140/85 mmHg  Pulse 89  Temp(Src) 98.8 F (37.1 C) (Oral)  Resp 16  Ht 5\' 11"  (1.803 m)  Wt 184 lb 12.8 oz (83.825 kg)  BMI 25.79 kg/m2  SpO2 98% Physical Exam  Constitutional: He is oriented to person, place, and time. He appears well-developed and well-nourished. No distress.  HENT:  Head: Normocephalic and atraumatic.  Right Ear: External ear normal.  Left Ear: External ear normal.  Nose: Nose normal.  Mouth/Throat: Oropharynx is clear and moist.  Eyes: Conjunctivae and EOM are normal. Pupils are equal, round, and reactive to light.  Neck: Normal range of motion. Neck supple. Carotid bruit is not present. No thyromegaly present.  Cardiovascular: Normal rate, regular rhythm, normal heart sounds and intact distal pulses.  Exam reveals no gallop and no friction rub.   No murmur heard. Pulmonary/Chest: Effort normal and breath sounds normal. He has no wheezes. He has no rales.  Abdominal: Soft. Bowel sounds are normal. He exhibits no distension and no mass. There is no  tenderness. There is no rebound and no guarding.  Lymphadenopathy:    He has no cervical adenopathy.  Neurological: He is alert and oriented to person, place, and time. No cranial nerve deficit.  Skin: Skin is warm and dry. No rash noted. He is not diaphoretic.  Psychiatric: He has a normal mood and affect. His behavior is normal.  Nursing note and vitals reviewed.  Results for orders placed or performed in visit on 03/20/15  Urine culture  Result Value Ref Range   Colony Count NO GROWTH    Organism ID, Bacteria NO GROWTH   POCT UA - Microscopic Only  Result Value Ref Range   WBC, Ur, HPF, POC 0-3    RBC, urine, microscopic 0-3    Bacteria, U Microscopic trace    Mucus, UA negative    Epithelial  cells, urine per micros 0-2    Crystals, Ur, HPF, POC negative    Casts, Ur, LPF, POC negative    Yeast, UA negative   POCT urinalysis dipstick  Result Value Ref Range   Color, UA yellow    Clarity, UA clear    Glucose, UA negative    Bilirubin, UA negative    Ketones, UA negative    Spec Grav, UA <=1.005    Blood, UA moderate    pH, UA 6.0    Protein, UA trace    Urobilinogen, UA 0.2    Nitrite, UA negative    Leukocytes, UA small (1+) (A) Negative       Assessment & Plan:   1. Insomnia   2. Depression   3. Bladder spasms   4. Dysuria     1. Insomnia: uncontrolled despite Ambien and Trazodone; refill of Both Ambien and Trazodone at this time.  Recommend increasing Neurontin at next visit to 300mg  tid to treat any underlying neuropathic pain from recent prostate surgery and lumbar radiculopathy. Plan to increase Neurontin dose at next visit. 2.  Depression: uncontrolled despite Zoloft 200mg  daily; consider switching to Cymbalta at next visit to treat chronic pain and any neuropathic pain. 3.  Bladder spasms: Uncontrolled; follow-up with urology in September; encourage pt to discuss bladder spasms with urology. 4.  Genital pain: uncontrolled; Tramadol and Hydrocodone do not  treat pain; increase Gabapentin 300mg  to tid; rx for oxycodone 1 tablet tid RPN severe pain yet this is not a long term answer.  No improvement with AZO or narcotics; suggestive of potential neuropathic pain.  No improvement with repeat antibiotic therapy thus do not feel infection etiology to pain. Repeat urine cultures negative. 5. Prostate cancer: managed by urology and radiation oncology.  Suffering with urinary retention and genital pain since procedure.  Urinary retention is improving.   Meds ordered this encounter  Medications  . zolpidem (AMBIEN) 10 MG tablet    Sig: Take 1 tablet (10 mg total) by mouth at bedtime as needed for sleep.    Dispense:  30 tablet    Refill:  3  . traZODone (DESYREL) 100 MG tablet    Sig: Take 1 tablet (100 mg total) by mouth at bedtime.    Dispense:  90 tablet    Refill:  3  . oxyCODONE (OXY IR/ROXICODONE) 5 MG immediate release tablet    Sig: Take 1 tablet (5 mg total) by mouth 3 (three) times daily as needed for severe pain.    Dispense:  40 tablet    Refill:  0    Return in about 3 months (around 07/11/2015).     Devan Danzer Elayne Guerin, M.D. [facility] [address] [place], [place]  [address] [phone] phone [phone] fax

## 2015-04-11 ENCOUNTER — Telehealth: Payer: Self-pay | Admitting: Family Medicine

## 2015-04-11 NOTE — Telephone Encounter (Signed)
done

## 2015-04-11 NOTE — Telephone Encounter (Signed)
Patient request for Dr. Tamala Julian to refer him to [facility] Dr. Benjiman Core. Please call patient at [phone]

## 2015-05-20 ENCOUNTER — Other Ambulatory Visit: Payer: Self-pay | Admitting: Family Medicine

## 2015-06-17 ENCOUNTER — Encounter: Payer: Self-pay | Admitting: Family Medicine

## 2015-06-17 ENCOUNTER — Ambulatory Visit (INDEPENDENT_AMBULATORY_CARE_PROVIDER_SITE_OTHER): Payer: Medicare HMO | Admitting: Family Medicine

## 2015-06-17 VITALS — BP 136/83 | HR 77 | Temp 98.3°F | Resp 16 | Ht 70.0 in | Wt 193.8 lb

## 2015-06-17 DIAGNOSIS — F419 Anxiety disorder, unspecified: Secondary | ICD-10-CM

## 2015-06-17 DIAGNOSIS — R55 Syncope and collapse: Secondary | ICD-10-CM | POA: Diagnosis not present

## 2015-06-17 DIAGNOSIS — M79602 Pain in left arm: Secondary | ICD-10-CM | POA: Diagnosis not present

## 2015-06-17 DIAGNOSIS — M5136 Other intervertebral disc degeneration, lumbar region: Secondary | ICD-10-CM

## 2015-06-17 DIAGNOSIS — I10 Essential (primary) hypertension: Secondary | ICD-10-CM

## 2015-06-17 DIAGNOSIS — E7800 Pure hypercholesterolemia, unspecified: Secondary | ICD-10-CM | POA: Diagnosis not present

## 2015-06-17 DIAGNOSIS — C61 Malignant neoplasm of prostate: Secondary | ICD-10-CM

## 2015-06-17 LAB — CBC WITH DIFFERENTIAL/PLATELET
Basophils Absolute: 0 10*3/uL (ref 0.0–0.1)
Basophils Relative: 0 % (ref 0–1)
Eosinophils Absolute: 0.1 10*3/uL (ref 0.0–0.7)
Eosinophils Relative: 1 % (ref 0–5)
HCT: 44 % (ref 39.0–52.0)
Hemoglobin: 14.5 g/dL (ref 13.0–17.0)
Lymphocytes Relative: 32 % (ref 12–46)
Lymphs Abs: 2.1 10*3/uL (ref 0.7–4.0)
MCH: 30.7 pg (ref 26.0–34.0)
MCHC: 33 g/dL (ref 30.0–36.0)
MCV: 93 fL (ref 78.0–100.0)
MONO ABS: 0.5 10*3/uL (ref 0.1–1.0)
MPV: 9.8 fL (ref 8.6–12.4)
Monocytes Relative: 8 % (ref 3–12)
Neutro Abs: 3.8 10*3/uL (ref 1.7–7.7)
Neutrophils Relative %: 59 % (ref 43–77)
Platelets: 306 10*3/uL (ref 150–400)
RBC: 4.73 MIL/uL (ref 4.22–5.81)
RDW: 14.8 % (ref 11.5–15.5)
WBC: 6.5 10*3/uL (ref 4.0–10.5)

## 2015-06-17 LAB — COMPREHENSIVE METABOLIC PANEL
ALT: 16 U/L (ref 9–46)
AST: 24 U/L (ref 10–35)
Albumin: 4.3 g/dL (ref 3.6–5.1)
Alkaline Phosphatase: 70 U/L (ref 40–115)
BUN: 19 mg/dL (ref 7–25)
CO2: 29 mmol/L (ref 20–31)
Calcium: 9.7 mg/dL (ref 8.6–10.3)
Chloride: 100 mmol/L (ref 98–110)
Creat: 0.97 mg/dL (ref 0.70–1.33)
GLUCOSE: 99 mg/dL (ref 65–99)
POTASSIUM: 4.3 mmol/L (ref 3.5–5.3)
Sodium: 139 mmol/L (ref 135–146)
Total Bilirubin: 0.3 mg/dL (ref 0.2–1.2)
Total Protein: 7.2 g/dL (ref 6.1–8.1)

## 2015-06-17 LAB — LIPID PANEL
Cholesterol: 143 mg/dL (ref 125–200)
HDL: 65 mg/dL (ref >=40)
LDL CALC: 66 mg/dL (ref <130)
TRIGLYCERIDES: 61 mg/dL (ref <150)
Total CHOL/HDL Ratio: 2.2 Ratio (ref <=5.0)
VLDL: 12 mg/dL (ref <30)

## 2015-06-17 LAB — TSH: TSH: 0.849 u[IU]/mL (ref 0.350–4.500)

## 2015-06-17 MED ORDER — GABAPENTIN 300 MG PO CAPS: 300.0000 mg | ORAL_CAPSULE | Freq: Three times a day (TID) | ORAL | Status: DC

## 2015-06-17 NOTE — Progress Notes (Signed)
Subjective:    Patient ID: Justin Dunn, male    DOB: 08-17-1961, 54 y.o.   MRN: [id number]  06/17/2015  Follow-up; Depression; and Medication Refill   HPI This 54 y.o. male presents for six month follow-up:    1.  BPH: s/p TURP in July 2016.  Now no longer needing to self cathing.  Follow-up two weeks ago; still having urinary retention; 80cc.  Gradually, bladder will improve; will take 6-8 months before full recovery.   Having horrible night sweats.  At times, feels like not completely emptying bladder.  Having mild discomfort.  Increased Gabapentin to two every morning and two at night.  Leaking a little bit; wears a pad.  Also taking AZO.  Does not take much during the day.  2.  Anxiety and depression: did not go for psychiatry consultation.  Asked $145 before visit.  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  Major stressful event.  Horrible year with prostate cancer. Sleeping well with Ambien and Trazodone.    3.  DDD lumbar: [facility] Pat[facility]; [facility].  Last visit in 2013.  No other orthopedic doctor.  Now having a lot of lower back pain. Disability for lower back since 2013.  No pain management clinic. Suffers with chronic lower back pain.  Prescribed Gabapentin at [facility].  Increased Gabapentin on own to two bid.  Lower back pain has worsened.  Oxycodone causes constipation.  Not taking Tramadol.    4.  L upper arm tingling: the other evening, went into kitchen, felt very weak.  Slumped over the refrigerator; had to go to the floor.  No strength in L leg.  L leg went back.  Hit head.  +Syncopal event. Had to drag self to doorway; braced self to bathroom; then vomited.  No chest pain. +nausea.  No vomiting.  No incontinence.  Went and got ice cream and ate it; propped head up and started feeling better.  Tingling and numbness in L arm. No neck pain.  With laying down, will feel numb in L arm.  If using L hand,  gets shakiness in L arm.  Sharp pain will radaite into arm.    Review of Systems  Constitutional: Positive for diaphoresis. Negative for fever, chills, activity change, appetite change and fatigue.  Respiratory: Negative for cough and shortness of breath.   Cardiovascular: Negative for chest pain, palpitations and leg swelling.  Gastrointestinal: Negative for nausea, vomiting, abdominal pain, diarrhea, constipation, blood in stool, abdominal distention, anal bleeding and rectal pain.  Endocrine: Negative for cold intolerance, heat intolerance, polydipsia, polyphagia and polyuria.  Genitourinary: Positive for dysuria, urgency, decreased urine volume, difficulty urinating and penile pain. Negative for frequency, hematuria, flank pain, discharge, penile swelling, scrotal swelling, genital sores and testicular pain.  Musculoskeletal: Positive for myalgias and back pain.  Skin: Negative for color change, rash and wound.  Neurological: Positive for syncope. Negative for dizziness, tremors, seizures, facial asymmetry, speech difficulty, weakness, light-headedness, numbness and headaches.  Psychiatric/Behavioral: Positive for dysphoric mood. Negative for suicidal ideas, sleep disturbance and self-injury. The patient is nervous/anxious.     Past Medical History  Diagnosis Date  . Hypertension   . Chronic low back pain   . Anxiety   . Hypogonadism male   . Depression   . History of gastric ulcer     as child  . ED (erectile dysfunction)   . History of colon polyps   .  Lumbar spine scoliosis   . Wears glasses   . At risk for sleep apnea     STOP-BANG= 4   SENT TO PCP 04-02-2014  . Prostate cancer [facility])     Dr Tammi Klippel ( oncologist) and Dr Loel Lofty [facility])   . Incomplete bladder emptying   . Retention of urine, unspecified   . H/O: depression   . Sleep apnea   . Premature ejaculation    Past Surgical History  Procedure Laterality Date  . Prostate biopsy    . Lumbar fusion  04/  2012    and rod  . Colonoscopy w/ polypectomy  july 2015  . Appendectomy  age 4  . Radioactive seed implant N/A 04/06/2014    Procedure: RADIOACTIVE SEED IMPLANT;  Surgeon: [name], MD;  Location: [facility];  Service: Urology;  Laterality: N/A;  dr portable    No Known Allergies  Social History   Social History  . Marital Status: Divorced    Spouse Name: N/A  . Number of Children: N/A  . Years of Education: N/A   Occupational History  . disability     DDD lumbar spine; 2013   Social History Main Topics  . Smoking status: Current Some Day Smoker -- 1.00 packs/day for 10 years    Types: Cigarettes, Cigars  . Smokeless tobacco: Never Used     Comment: currently smokes 2 small cigar per day/  quit cigarettes 2011  . Alcohol Use: 0.0 oz/week    0 Standard drinks or equivalent per week     Comment: occasional  . Drug Use: No  . Sexual Activity: No   Other Topics Concern  . Not on file   Social History Narrative   Family History  Problem Relation Age of Onset  . Cancer Father     prostate       Objective:    BP 136/83 mmHg  Pulse 77  Temp(Src) 98.3 F (36.8 C) (Oral)  Resp 16  Ht 5\' 10"  (1.778 m)  Wt 193 lb 12.8 oz (87.907 kg)  BMI 27.81 kg/m2 Physical Exam  Constitutional: He is oriented to person, place, and time. He appears well-developed and well-nourished. No distress.  HENT:  Head: Normocephalic and atraumatic.  Right Ear: External ear normal.  Left Ear: External ear normal.  Nose: Nose normal.  Mouth/Throat: Oropharynx is clear and moist.  Eyes: Conjunctivae and EOM are normal. Pupils are equal, round, and reactive to light.  Neck: Normal range of motion. Neck supple. Carotid bruit is not present. No thyromegaly present.  Cardiovascular: Normal rate, regular rhythm, normal heart sounds and intact distal pulses.  Exam reveals no gallop and no friction rub.   No murmur heard. Pulmonary/Chest: Effort normal and breath sounds normal.  He has no wheezes. He has no rales.  Abdominal: Soft. Bowel sounds are normal. He exhibits no distension and no mass. There is no tenderness. There is no rebound and no guarding.  Lymphadenopathy:    He has no cervical adenopathy.  Neurological: He is alert and oriented to person, place, and time. He displays tremor. No cranial nerve deficit or sensory deficit. He exhibits normal muscle tone. Coordination normal.  L upper extremity tremor with extension of extremity.  Skin: Skin is warm and dry. No rash noted. He is not diaphoretic.  Psychiatric: He has a normal mood and affect. His behavior is normal. Judgment and thought content normal.  Nursing note and vitals reviewed.  EKG: NSR; no  ST changes.     Assessment & Plan:   1. Malignant neoplasm of prostate ([place])   2. Anxiety   3. Syncope and collapse   4. Pure hypercholesterolemia   5. Left arm pain   6. Degenerative disc disease, lumbar   7. Essential hypertension, benign     1. Prostate cancer:  Stable; s/p seed implants by radiation oncology in 03/2014; s/p TURP by Dr. Rosana Hoes; now no longer needing to self cath; urinating better; using AZO prn.  May take six months after TURP for urination to return to baseline. Reporting some persistent urinary retention but improving. No longer warrants narcotics for pain. 2.  Anxiety and depression: mood much improved today with improving urinary status. Continue Zoloft 200mg  daily. 3. Insomnia: controlled; sleeping well with Ambien and Trazodone. 4  Syncope: New.  Stable EKG; obtain labs; suffered syncopal event in past month; refer to cardiology to rule out arrhythmia or primary cardiac etiology. 5.  DDD lumbar spine: stable; increase Neurontin 300mg  to 1-2 tid; refer to ortho due to worsening lower back pain. S/p previous lumbar surgery by Cohen. 6.  L arm pain: New.  Associated with syncopal event in past month; mild tremor of LUE with use.  Refer to ortho. 7. HTN: controlled; obtain labs;  continue current medication. 8. Hypercholesterolemia: obtain FLP.   Orders Placed This Encounter  Procedures  . CBC with Differential/Platelet  . Comprehensive metabolic panel  . TSH  . Lipid panel    Order Specific Question:  Has the patient fasted?    Answer:  Yes  . Ambulatory referral to Orthopedic Surgery    Referral Priority:  Routine    Referral Type:  Surgical    Referral Reason:  Specialty Services Required    Requested Specialty:  Orthopedic Surgery    Number of Visits Requested:  1  . Ambulatory referral to Cardiology    Referral Priority:  Routine    Referral Type:  Consultation    Referral Reason:  Specialty Services Required    Requested Specialty:  Cardiology    Number of Visits Requested:  1  . EKG 12-Lead   Meds ordered this encounter  Medications  . gabapentin (NEURONTIN) 300 MG capsule    Sig: Take 1-2 capsules (300-600 mg total) by mouth 3 (three) times daily. TAKE 1 CAPSULE BY MOUTH THREE TIMES A DAY    Dispense:  180 capsule    Refill:  5    Return in about 2 months (around 08/17/2015) for recheck.    Kristi Elayne Guerin, M.D. [facility] [address] [place], [place]  [address] [phone] phone [phone] fax

## 2015-06-18 MED ORDER — GABAPENTIN 300 MG PO CAPS: 300.0000 mg | ORAL_CAPSULE | Freq: Three times a day (TID) | ORAL | Status: DC

## 2015-06-25 ENCOUNTER — Telehealth: Payer: Self-pay

## 2015-06-25 NOTE — Telephone Encounter (Signed)
Hey Dr. Tamala Julian  Mr. Galicia called in wanting his lab results.  Please advise

## 2015-06-28 NOTE — Telephone Encounter (Signed)
Lab results sent to lab pool to contact pt with results. 

## 2015-07-03 ENCOUNTER — Encounter: Payer: Self-pay | Admitting: Internal Medicine

## 2015-07-03 ENCOUNTER — Ambulatory Visit (INDEPENDENT_AMBULATORY_CARE_PROVIDER_SITE_OTHER): Payer: Medicare HMO | Admitting: Internal Medicine

## 2015-07-03 VITALS — BP 148/84 | HR 87 | Ht 70.0 in | Wt 196.0 lb

## 2015-07-03 DIAGNOSIS — R55 Syncope and collapse: Secondary | ICD-10-CM | POA: Diagnosis not present

## 2015-07-03 NOTE — Assessment & Plan Note (Signed)
The patient has fairly clear evidence of autonomic dysfunction. We discussed the importance of maintaining a high fluid high salt diet, avoiding caffeine, and lying down when he feels an episode occurring. He will undergo watchful waiting. I did not recommend midodrine or Florinef at this time.

## 2015-07-03 NOTE — Patient Instructions (Signed)
Your physician recommends that you continue on your current medications as directed. Please refer to the Current Medication list given to you today. Your physician wants you to follow-up in: 1 year with Dr. Lovena Le.  You will receive a reminder letter in the mail two months in advance. If you don't receive a letter, please call our office to schedule the follow-up appointment. Per Dr. Lovena Le: 1.) eat more salt 2.) drink less caffeine 3.) lie down when you feel a spell coming on
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
## 2015-07-18 ENCOUNTER — Other Ambulatory Visit: Payer: Self-pay | Admitting: Orthopedic Surgery

## 2015-07-18 DIAGNOSIS — M5417 Radiculopathy, lumbosacral region: Secondary | ICD-10-CM

## 2015-08-19 ENCOUNTER — Ambulatory Visit (INDEPENDENT_AMBULATORY_CARE_PROVIDER_SITE_OTHER): Payer: Medicare HMO | Admitting: Family Medicine

## 2015-08-19 ENCOUNTER — Encounter: Payer: Self-pay | Admitting: Family Medicine

## 2015-08-19 VITALS — BP 139/83 | HR 71 | Temp 98.4°F | Resp 16 | Ht 70.5 in | Wt 201.8 lb

## 2015-08-19 DIAGNOSIS — Z111 Encounter for screening for respiratory tuberculosis: Secondary | ICD-10-CM | POA: Diagnosis not present

## 2015-08-19 DIAGNOSIS — R399 Unspecified symptoms and signs involving the genitourinary system: Secondary | ICD-10-CM | POA: Diagnosis not present

## 2015-08-19 DIAGNOSIS — Z1159 Encounter for screening for other viral diseases: Secondary | ICD-10-CM | POA: Diagnosis not present

## 2015-08-19 DIAGNOSIS — C61 Malignant neoplasm of prostate: Secondary | ICD-10-CM | POA: Diagnosis not present

## 2015-08-19 DIAGNOSIS — N3289 Other specified disorders of bladder: Secondary | ICD-10-CM | POA: Diagnosis not present

## 2015-08-19 DIAGNOSIS — G4700 Insomnia, unspecified: Secondary | ICD-10-CM | POA: Diagnosis not present

## 2015-08-19 DIAGNOSIS — Q646 Congenital diverticulum of bladder: Secondary | ICD-10-CM | POA: Diagnosis not present

## 2015-08-19 DIAGNOSIS — R61 Generalized hyperhidrosis: Secondary | ICD-10-CM | POA: Diagnosis not present

## 2015-08-19 LAB — CBC WITH DIFFERENTIAL/PLATELET
BASOS PCT: 0 % (ref 0–1)
Basophils Absolute: 0 10*3/uL (ref 0.0–0.1)
EOS ABS: 0.1 10*3/uL (ref 0.0–0.7)
Eosinophils Relative: 1 % (ref 0–5)
HCT: 43.3 % (ref 39.0–52.0)
HEMOGLOBIN: 14.9 g/dL (ref 13.0–17.0)
Lymphocytes Relative: 37 % (ref 12–46)
Lymphs Abs: 2.7 10*3/uL (ref 0.7–4.0)
MCH: 32.1 pg (ref 26.0–34.0)
MCHC: 34.4 g/dL (ref 30.0–36.0)
MCV: 93.3 fL (ref 78.0–100.0)
MONO ABS: 0.5 10*3/uL (ref 0.1–1.0)
MPV: 9.3 fL (ref 8.6–12.4)
Monocytes Relative: 7 % (ref 3–12)
NEUTROS ABS: 4 10*3/uL (ref 1.7–7.7)
Neutrophils Relative %: 55 % (ref 43–77)
PLATELETS: 235 10*3/uL (ref 150–400)
RBC: 4.64 MIL/uL (ref 4.22–5.81)
RDW: 14.7 % (ref 11.5–15.5)
WBC: 7.2 10*3/uL (ref 4.0–10.5)

## 2015-08-19 MED ORDER — ZOLPIDEM TARTRATE 10 MG PO TABS: 10.0000 mg | ORAL_TABLET | Freq: Every evening | ORAL | Status: DC | PRN

## 2015-08-19 MED ORDER — ENALAPRIL MALEATE 20 MG PO TABS: 20.0000 mg | ORAL_TABLET | Freq: Every day | ORAL | Status: DC

## 2015-08-19 NOTE — Progress Notes (Signed)
Subjective:    Patient ID: Justin Dunn, male    DOB: 1961/04/28, 53 y.o.   MRN: [id number]  08/19/2015  Follow-up and Medication Refill   HPI This 54 y.o. male presents for two month follow-up:   1. Syncope: referred to cardiology; diagnosed with autonomic dysfunction. No further work up warranted by Dr. Lovena Le.  Recommended high fluid high salt diet, avoiding caffeine, lying down when feeling faint. BP dropped 20 points with orthostatics at cardiology.     2.  DDD lumbar spine: referred to ortho; MRI spine ordered by Dimonsky on 07/17/15.  Increased Neurontin to tid. S/p evaluation by Dimonsky.  S/p lumbar films.  [place] [facility] on [address] to schedule MRI.  Last MRI of lumbar spine 2012 with surgery.  3.  L arm pain with tremor: referred to ortho.  4.  Prostate cancer with urinary retention: s/p TURP by Rosana Hoes in 03/2014 with improvement in urination.  Not needing to self cath; pain improving; no narcotics prescribed at last visit due to ineffectiveness.  Last visit with [facility]/Urology on 05/24/15.  Bladder neck obstruction.   Still not requiring self catheterization; continues to use AZO.  Stream is weak but able to relieve self.  Wearing Depends; will leak a little. Not emptying bladder completely.  Mild discomfort only.    [place] Controlled Substance Registry: 11/15 Ambien 10mg  Tamala Julian 11/15 Tramadol 50mg  60  Kayla McKenzie 10/10 Ambien 10mg  Kearra Calkin 9/12 Ambien 10mg  Trei Schoch 8/11 Ambien 10mg  Shernita Rabinovich 8/10 Oxycodone 5mg  40 Letitia Sabala  5. Anxiety and depression:  Patient reports good compliance with medication, good tolerance to medication, and good symptom control.  Emotionally doing much better since prostate cancer issues have improved.    6. HTN: cooks with kosher salt and pepper;   7.  Night sweats: sleeps on 70 heater.  Always keeps house on 53.  Increases heat to 75.  Old house.  No cough.  No tb exposures.    8. Tobacco abuse:  Wants to stop smoking. Smoked ten years.   Cigars 4.     Review of Systems  Constitutional: Positive for diaphoresis. Negative for fever, chills, activity change, appetite change and fatigue.  Respiratory: Negative for cough and shortness of breath.   Cardiovascular: Negative for chest pain, palpitations and leg swelling.  Gastrointestinal: Negative for nausea, vomiting, abdominal pain and diarrhea.  Endocrine: Negative for cold intolerance, heat intolerance, polydipsia, polyphagia and polyuria.  Skin: Negative for color change, rash and wound.  Neurological: Negative for dizziness, tremors, seizures, syncope, facial asymmetry, speech difficulty, weakness, light-headedness, numbness and headaches.  Psychiatric/Behavioral: Negative for suicidal ideas, self-injury and dysphoric mood. The patient is not nervous/anxious.     Past Medical History  Diagnosis Date  . Hypertension   . Chronic low back pain   . Anxiety   . Hypogonadism male   . Depression   . History of gastric ulcer     as child  . ED (erectile dysfunction)   . History of colon polyps   . Lumbar spine scoliosis   . Wears glasses   . At risk for sleep apnea     STOP-BANG= 4   SENT TO PCP 04-02-2014  . Prostate cancer [facility])     Dr Tammi Klippel ( oncologist) and Dr Loel Lofty [facility])   . Incomplete bladder emptying   . Retention of urine, unspecified   . H/O: depression   . Sleep apnea   . Premature ejaculation    Past Surgical History  Procedure Laterality  Date  . Prostate biopsy    . Lumbar fusion  04/ 2012    and rod  . Colonoscopy w/ polypectomy  july 2015  . Appendectomy  age 58  . Radioactive seed implant N/A 04/06/2014    Procedure: RADIOACTIVE SEED IMPLANT;  Surgeon: [name], MD;  Location: [facility];  Service: Urology;  Laterality: N/A;  dr portable    No Known Allergies  Social History   Social History  . Marital Status: Divorced    Spouse Name: N/A  . Number of Children: N/A  . Years of Education: N/A   Occupational History   . disability     DDD lumbar spine; 2013   Social History Main Topics  . Smoking status: Current Some Day Smoker -- 1.00 packs/day for 10 years    Types: Cigarettes, Cigars  . Smokeless tobacco: Never Used     Comment: currently smokes 2 small cigar per day/  quit cigarettes 2011  . Alcohol Use: 0.0 oz/week    0 Standard drinks or equivalent per week     Comment: occasional  . Drug Use: No  . Sexual Activity: No   Other Topics Concern  . Not on file   Social History Narrative   Family History  Problem Relation Age of Onset  . Cancer Father     prostate       Objective:    BP 139/83 mmHg  Pulse 71  Temp(Src) 98.4 F (36.9 C) (Oral)  Resp 16  Ht 5' 10.5" (1.791 m)  Wt 201 lb 12.8 oz (91.536 kg)  BMI 28.54 kg/m2  SpO2 98% Physical Exam  Constitutional: He is oriented to person, place, and time. He appears well-developed and well-nourished. No distress.  HENT:  Head: Normocephalic and atraumatic.  Right Ear: External ear normal.  Left Ear: External ear normal.  Nose: Nose normal.  Mouth/Throat: Oropharynx is clear and moist.  Eyes: Conjunctivae and EOM are normal. Pupils are equal, round, and reactive to light.  Neck: Normal range of motion. Neck supple. Carotid bruit is not present. No thyromegaly present.  Cardiovascular: Normal rate, regular rhythm, normal heart sounds and intact distal pulses.  Exam reveals no gallop and no friction rub.   No murmur heard. Pulmonary/Chest: Effort normal and breath sounds normal. He has no wheezes. He has no rales.  Abdominal: Soft. Bowel sounds are normal. He exhibits no distension and no mass. There is no tenderness. There is no rebound and no guarding.  Lymphadenopathy:    He has no cervical adenopathy.  Neurological: He is alert and oriented to person, place, and time. No cranial nerve deficit.  Skin: Skin is warm and dry. No rash noted. He is not diaphoretic.  Psychiatric: He has a normal mood and affect. His behavior is  normal.  Nursing note and vitals reviewed.       Assessment & Plan:   1. Malignant neoplasm of prostate ([place])   2. Lower urinary tract symptoms   3. [place] diverticulum of urinary bladder   4. Bladder spasms   5. Adenocarcinoma of prostate ([place])   6. Night sweats   7. Need for hepatitis C screening test   8. Insomnia   9. Screening for tuberculosis     Orders Placed This Encounter  Procedures  . PSA  . Hepatitis C antibody  . CBC with Differential/Platelet  . TB Skin Test    Order Specific Question:  Has patient ever tested positive?    Answer:  No   Meds ordered this encounter  Medications  . traMADol (ULTRAM) 50 MG tablet    Sig: Take by mouth every 6 (six) hours as needed.  . meloxicam (MOBIC) 15 MG tablet    Sig: Take 15 mg by mouth daily.  . methocarbamol (ROBAXIN) 500 MG tablet    Sig: Take 500 mg by mouth as needed for muscle spasms.  . enalapril (VASOTEC) 20 MG tablet    Sig: Take 1 tablet (20 mg total) by mouth daily.    Dispense:  90 tablet    Refill:  3  . zolpidem (AMBIEN) 10 MG tablet    Sig: Take 1 tablet (10 mg total) by mouth at bedtime as needed for sleep.    Dispense:  30 tablet    Refill:  5    Return in about 2 months (around 10/20/2015) for recheck blood pressure.    Breklyn Fabrizio Elayne Guerin, M.D. [facility] [address] [place], [place]  [address] [phone] phone [phone] fax

## 2015-08-19 NOTE — Progress Notes (Signed)

## 2015-08-20 LAB — PSA: PSA: 1.31 ng/mL (ref <=4.00)

## 2015-08-20 LAB — HEPATITIS C ANTIBODY: HCV AB: NEGATIVE

## 2015-08-21 ENCOUNTER — Ambulatory Visit (INDEPENDENT_AMBULATORY_CARE_PROVIDER_SITE_OTHER): Payer: Medicare HMO

## 2015-08-21 DIAGNOSIS — Z111 Encounter for screening for respiratory tuberculosis: Secondary | ICD-10-CM

## 2015-08-21 LAB — TB SKIN TEST
Induration: 0 mm
TB Skin Test: NEGATIVE

## 2015-08-30 ENCOUNTER — Ambulatory Visit
Admission: RE | Admit: 2015-08-30 | Discharge: 2015-08-30 | Disposition: A | Discharge status: Home / Self Care | Payer: Medicare HMO | Source: Ambulatory Visit | Attending: Orthopedic Surgery | Admitting: Orthopedic Surgery

## 2015-08-30 DIAGNOSIS — M5417 Radiculopathy, lumbosacral region: Secondary | ICD-10-CM

## 2015-09-10 ENCOUNTER — Encounter: Payer: Self-pay | Admitting: Family Medicine

## 2015-09-16 ENCOUNTER — Telehealth: Payer: Self-pay

## 2015-09-16 NOTE — Telephone Encounter (Signed)
Patient is aware of appointment change on 11/05/2015 at 11:00 with Dr. Tamala Julian.

## 2015-10-21 ENCOUNTER — Ambulatory Visit: Payer: Medicare HMO | Admitting: Family Medicine

## 2015-11-05 ENCOUNTER — Ambulatory Visit (INDEPENDENT_AMBULATORY_CARE_PROVIDER_SITE_OTHER): Payer: Medicare HMO | Admitting: Family Medicine

## 2015-11-05 ENCOUNTER — Encounter: Payer: Self-pay | Admitting: Family Medicine

## 2015-11-05 VITALS — BP 139/88 | HR 70 | Temp 98.2°F | Resp 16 | Ht 71.0 in | Wt 201.0 lb

## 2015-11-05 DIAGNOSIS — R220 Localized swelling, mass and lump, head: Secondary | ICD-10-CM

## 2015-11-05 DIAGNOSIS — C61 Malignant neoplasm of prostate: Secondary | ICD-10-CM

## 2015-11-05 DIAGNOSIS — M5136 Other intervertebral disc degeneration, lumbar region: Secondary | ICD-10-CM

## 2015-11-05 DIAGNOSIS — I10 Essential (primary) hypertension: Secondary | ICD-10-CM | POA: Diagnosis not present

## 2015-11-05 DIAGNOSIS — R399 Unspecified symptoms and signs involving the genitourinary system: Secondary | ICD-10-CM

## 2015-11-05 MED ORDER — TIZANIDINE HCL 4 MG PO CAPS: 4.0000 mg | ORAL_CAPSULE | Freq: Three times a day (TID) | ORAL | Status: DC | PRN

## 2015-11-05 MED ORDER — TRAMADOL HCL 50 MG PO TABS
50.0000 mg | ORAL_TABLET | Freq: Three times a day (TID) | ORAL | Status: DC | PRN
Start: 2015-11-05 — End: 2015-11-05

## 2015-11-05 MED ORDER — TRAMADOL HCL 50 MG PO TABS: 50.0000 mg | ORAL_TABLET | Freq: Three times a day (TID) | ORAL | Status: DC | PRN

## 2015-11-05 NOTE — Progress Notes (Signed)
Subjective:    Patient ID: Justin Dunn, male    DOB: [birth year], 55 y.o.   MRN: [id number]  11/05/2015  Hypertension; Referral; and Medication Refill   HPI This 55 y.o. male presents for two month follow-up:  1. Cold: onset in past week.  Has been using honey, rum, lime.  Onset two weeks ago.  No fever/chills; +mild sweats.  Vicks behind ears and chest and feet.  Socks.  +chest congestion; +nasal congestion.   2. HTN: switched Enalapril-HCTZ to Enalapril 20mg  daily at last visit.    3.  Urinary retention: still not emptying completely due to pickets in bladder due to radiation; when urinates feels like not emptying all the way.   4. DDD lumbar: referred to [facility] at last visit; s/p four injections in lumbar spine.  Needs refill of Tramadol.  Also taking Neurontin 300mg  tid.  Using Mobic 15mg  daily.  Has also seen Jacelyn Grip; recommended returning to [facility] who performed fusion; developing DDD above fusion.  [place]. S/p MRI [facility] last month.  5.  Prostate cancer:  04/2015 1.08; repeat in 09/2015 1.31.    Review of Systems  Constitutional: Negative for fever, chills, diaphoresis, activity change, appetite change and fatigue.  Respiratory: Negative for cough and shortness of breath.   Cardiovascular: Negative for chest pain, palpitations and leg swelling.  Gastrointestinal: Negative for nausea, vomiting, abdominal pain and diarrhea.  Endocrine: Negative for cold intolerance, heat intolerance, polydipsia, polyphagia and polyuria.  Skin: Negative for color change, rash and wound.  Neurological: Negative for dizziness, tremors, seizures, syncope, facial asymmetry, speech difficulty, weakness, light-headedness, numbness and headaches.  Psychiatric/Behavioral: Negative for sleep disturbance and dysphoric mood. The patient is not nervous/anxious.     Past Medical History  Diagnosis Date  . Hypertension   . Chronic low back pain   . Anxiety   . Hypogonadism male   .  Depression   . History of gastric ulcer     as child  . ED (erectile dysfunction)   . History of colon polyps   . Lumbar spine scoliosis   . Wears glasses   . At risk for sleep apnea     STOP-BANG= 4   SENT TO PCP 04-02-2014  . Prostate cancer [facility])     Dr Tammi Klippel ( oncologist) and Dr Loel Lofty [facility])   . Incomplete bladder emptying   . Retention of urine, unspecified   . H/O: depression   . Sleep apnea   . Premature ejaculation    Past Surgical History  Procedure Laterality Date  . Prostate biopsy    . Lumbar fusion  04/ 2012    and rod  . Colonoscopy w/ polypectomy  july 2015  . Appendectomy  age 38  . Radioactive seed implant N/A 04/06/2014    Procedure: RADIOACTIVE SEED IMPLANT;  Surgeon: [name], MD;  Location: [facility];  Service: Urology;  Laterality: N/A;  dr portable    No Known Allergies Current Outpatient Prescriptions  Medication Sig Dispense Refill  . enalapril (VASOTEC) 20 MG tablet Take 1 tablet (20 mg total) by mouth daily. 90 tablet 3  . enalapril-hydrochlorothiazide (VASERETIC) 10-25 MG per tablet take 1 tablet by mouth every morning 90 tablet 1  . gabapentin (NEURONTIN) 300 MG capsule Take 1-2 capsules (300-600 mg total) by mouth 3 (three) times daily. 180 capsule 5  . meloxicam (MOBIC) 15 MG tablet Take 15 mg by mouth daily.    . Nutritional Supplements (  NUTRITIONAL SUPPLEMENT PLUS PO) Take 1 capsule by mouth daily. Gets at health food store unsure of name of medication    . Phenazopyridine HCl (AZO URINARY PAIN PO) Take by mouth.    . sertraline (ZOLOFT) 100 MG tablet Take 2 tablets (200 mg total) by mouth daily. 180 tablet 3  . traZODone (DESYREL) 100 MG tablet Take 1 tablet (100 mg total) by mouth at bedtime. 90 tablet 3  . Turmeric (RA TURMERIC) 500 MG CAPS Take 1 tablet by mouth. Reported on 08/19/2015    . zolpidem (AMBIEN) 10 MG tablet Take 1 tablet (10 mg total) by mouth at bedtime as needed for sleep. 30 tablet 5  .  tiZANidine (ZANAFLEX) 4 MG capsule Take 1 capsule (4 mg total) by mouth 3 (three) times daily as needed for muscle spasms. 60 capsule 5  . traMADol (ULTRAM) 50 MG tablet Take 1 tablet (50 mg total) by mouth every 8 (eight) hours as needed. 60 tablet 5   No current facility-administered medications for this visit.   Social History   Social History  . Marital Status: Divorced    Spouse Name: N/A  . Number of Children: N/A  . Years of Education: N/A   Occupational History  . disability     DDD lumbar spine; 2013   Social History Main Topics  . Smoking status: Current Some Day Smoker -- 1.00 packs/day for 10 years    Types: Cigarettes, Cigars  . Smokeless tobacco: Never Used     Comment: currently smokes 2 small cigar per day/  quit cigarettes 2011  . Alcohol Use: 0.0 oz/week    0 Standard drinks or equivalent per week     Comment: occasional  . Drug Use: No  . Sexual Activity: No   Other Topics Concern  . Not on file   Social History Narrative   Family History  Problem Relation Age of Onset  . Cancer Father     prostate       Objective:    BP 139/88 mmHg  Pulse 70  Temp(Src) 98.2 F (36.8 C)  Resp 16  Ht 5\' 11"  (1.803 m)  Wt 201 lb (91.173 kg)  BMI 28.05 kg/m2 Physical Exam  Constitutional: He is oriented to person, place, and time. He appears well-developed and well-nourished. No distress.  HENT:  Head: Normocephalic and atraumatic.  Right Ear: External ear normal.  Left Ear: External ear normal.  Nose: Nose normal.  Mouth/Throat: Oropharynx is clear and moist.    Indurated nodule lateral aspect of R tongue.  Eyes: Conjunctivae and EOM are normal. Pupils are equal, round, and reactive to light.  Neck: Normal range of motion. Neck supple. Carotid bruit is not present. No thyromegaly present.  Cardiovascular: Normal rate, regular rhythm, normal heart sounds and intact distal pulses.  Exam reveals no gallop and no friction rub.   No murmur  heard. Pulmonary/Chest: Effort normal and breath sounds normal. He has no wheezes. He has no rales.  Abdominal: Soft. Bowel sounds are normal. He exhibits no distension and no mass. There is no tenderness. There is no rebound and no guarding.  Lymphadenopathy:    He has no cervical adenopathy.  Neurological: He is alert and oriented to person, place, and time. No cranial nerve deficit.  Skin: Skin is warm and dry. No rash noted. He is not diaphoretic.  Psychiatric: He has a normal mood and affect. His behavior is normal.  Nursing note and vitals reviewed.  Assessment & Plan:   1. Essential hypertension   2. Degenerative disc disease, lumbar   3. Nodule of tongue   4. Malignant neoplasm of prostate ([place])   5. Lower urinary tract symptoms     Orders Placed This Encounter  Procedures  . Comprehensive metabolic panel  . Ambulatory referral to Neurosurgery    Referral Priority:  Routine    Referral Type:  Surgical    Referral Reason:  Specialty Services Required    Requested Specialty:  Neurosurgery    Number of Visits Requested:  1  . Ambulatory referral to Oral Maxillofacial Surgery    Referral Priority:  Routine    Referral Type:  Surgical    Referral Reason:  Specialty Services Required    Requested Specialty:  Oral Surgery    Number of Visits Requested:  1   Meds ordered this encounter  Medications  . tiZANidine (ZANAFLEX) 4 MG capsule    Sig: Take 1 capsule (4 mg total) by mouth 3 (three) times daily as needed for muscle spasms.    Dispense:  60 capsule    Refill:  5  . DISCONTD: traMADol (ULTRAM) 50 MG tablet    Sig: Take 1 tablet (50 mg total) by mouth every 8 (eight) hours as needed.    Dispense:  30 tablet    Refill:  0  . traMADol (ULTRAM) 50 MG tablet    Sig: Take 1 tablet (50 mg total) by mouth every 8 (eight) hours as needed.    Dispense:  60 tablet    Refill:  5    Return in about 4 months (around 03/06/2016) for recheck high blood pressure,  depression, insomnia, chronic lower back pain.

## 2015-11-06 LAB — COMPREHENSIVE METABOLIC PANEL
ALK PHOS: 68 U/L (ref 40–115)
ALT: 20 U/L (ref 9–46)
AST: 21 U/L (ref 10–35)
Albumin: 4.3 g/dL (ref 3.6–5.1)
BILIRUBIN TOTAL: 0.4 mg/dL (ref 0.2–1.2)
BUN: 23 mg/dL (ref 7–25)
CO2: 29 mmol/L (ref 20–31)
CREATININE: 1.06 mg/dL (ref 0.70–1.33)
Calcium: 9.6 mg/dL (ref 8.6–10.3)
Chloride: 105 mmol/L (ref 98–110)
GLUCOSE: 94 mg/dL (ref 65–99)
Potassium: 4.5 mmol/L (ref 3.5–5.3)
SODIUM: 141 mmol/L (ref 135–146)
Total Protein: 6.8 g/dL (ref 6.1–8.1)

## 2015-11-12 ENCOUNTER — Telehealth: Payer: Self-pay

## 2015-11-12 NOTE — Telephone Encounter (Signed)
Patient would like a call back for his lab results.  He said he's been waiting a week for a call back.  CB#: [phone]  Placing as high priority - he has a history of cancer and would like piece of mind regarding his results.

## 2015-11-12 NOTE — Telephone Encounter (Signed)
Call --- labs are normal.

## 2015-11-19 ENCOUNTER — Encounter: Payer: Self-pay | Admitting: Family Medicine

## 2015-11-20 NOTE — Telephone Encounter (Signed)
Pt.notified

## 2016-02-11 ENCOUNTER — Other Ambulatory Visit: Payer: Self-pay | Admitting: Family Medicine

## 2016-02-11 ENCOUNTER — Telehealth: Payer: Self-pay | Admitting: Family Medicine

## 2016-02-11 DIAGNOSIS — G4700 Insomnia, unspecified: Secondary | ICD-10-CM

## 2016-02-11 NOTE — Telephone Encounter (Signed)
Patient request a refill Trazodone 100 MG. [place]

## 2016-02-12 MED ORDER — TRAZODONE HCL 100 MG PO TABS: 100.0000 mg | ORAL_TABLET | Freq: Every day | ORAL | Status: DC

## 2016-02-12 NOTE — Telephone Encounter (Signed)
Sent in last RF left on Dr Thompson Caul Rx sent to different pharm. Notified pt.

## 2016-02-25 ENCOUNTER — Other Ambulatory Visit: Payer: Self-pay | Admitting: Family Medicine

## 2016-02-27 NOTE — Telephone Encounter (Signed)
Rx printed and ready for pick up.

## 2016-03-31 ENCOUNTER — Ambulatory Visit: Payer: Medicare HMO | Admitting: Family Medicine

## 2016-05-06 ENCOUNTER — Encounter: Payer: Self-pay | Admitting: Family Medicine

## 2016-05-06 ENCOUNTER — Ambulatory Visit (INDEPENDENT_AMBULATORY_CARE_PROVIDER_SITE_OTHER): Payer: Medicare HMO

## 2016-05-06 ENCOUNTER — Ambulatory Visit (INDEPENDENT_AMBULATORY_CARE_PROVIDER_SITE_OTHER): Payer: Medicare HMO | Admitting: Family Medicine

## 2016-05-06 VITALS — BP 140/80 | HR 67 | Temp 98.3°F | Resp 16 | Ht 70.0 in | Wt 207.8 lb

## 2016-05-06 DIAGNOSIS — F99 Mental disorder, not otherwise specified: Secondary | ICD-10-CM

## 2016-05-06 DIAGNOSIS — M542 Cervicalgia: Secondary | ICD-10-CM

## 2016-05-06 DIAGNOSIS — M5136 Other intervertebral disc degeneration, lumbar region: Secondary | ICD-10-CM | POA: Diagnosis not present

## 2016-05-06 DIAGNOSIS — R251 Tremor, unspecified: Secondary | ICD-10-CM | POA: Diagnosis not present

## 2016-05-06 DIAGNOSIS — I10 Essential (primary) hypertension: Secondary | ICD-10-CM | POA: Diagnosis not present

## 2016-05-06 DIAGNOSIS — C61 Malignant neoplasm of prostate: Secondary | ICD-10-CM | POA: Diagnosis not present

## 2016-05-06 DIAGNOSIS — F4323 Adjustment disorder with mixed anxiety and depressed mood: Secondary | ICD-10-CM

## 2016-05-06 DIAGNOSIS — M51369 Other intervertebral disc degeneration, lumbar region without mention of lumbar back pain or lower extremity pain: Secondary | ICD-10-CM

## 2016-05-06 DIAGNOSIS — F5105 Insomnia due to other mental disorder: Secondary | ICD-10-CM

## 2016-05-06 DIAGNOSIS — M5030 Other cervical disc degeneration, unspecified cervical region: Secondary | ICD-10-CM | POA: Diagnosis not present

## 2016-05-06 LAB — POCT URINALYSIS DIP (MANUAL ENTRY)
Bilirubin, UA: NEGATIVE
Blood, UA: NEGATIVE
Glucose, UA: NEGATIVE
Ketones, POC UA: NEGATIVE
LEUKOCYTES UA: NEGATIVE
NITRITE UA: NEGATIVE
PH UA: 5.5
Protein Ur, POC: NEGATIVE
Spec Grav, UA: >=1.03
Urobilinogen, UA: 0.2

## 2016-05-06 MED ORDER — ZOLPIDEM TARTRATE 10 MG PO TABS: ORAL_TABLET | ORAL | 5 refills | Status: DC

## 2016-05-06 MED ORDER — TRAMADOL HCL 50 MG PO TABS: 50.0000 mg | ORAL_TABLET | Freq: Two times a day (BID) | ORAL | 5 refills | Status: DC | PRN

## 2016-05-06 MED ORDER — TRAZODONE HCL 100 MG PO TABS
100.0000 mg | ORAL_TABLET | Freq: Every day | ORAL | 1 refills | Status: DC
Start: 2016-05-06 — End: 2016-12-11

## 2016-05-06 MED ORDER — TIZANIDINE HCL 4 MG PO CAPS: 4.0000 mg | ORAL_CAPSULE | Freq: Three times a day (TID) | ORAL | 5 refills | Status: DC | PRN

## 2016-05-06 MED ORDER — SERTRALINE HCL 100 MG PO TABS: 200.0000 mg | ORAL_TABLET | Freq: Every day | ORAL | 3 refills | Status: DC

## 2016-05-06 MED ORDER — ENALAPRIL MALEATE 20 MG PO TABS: 20.0000 mg | ORAL_TABLET | Freq: Every day | ORAL | 3 refills | Status: DC

## 2016-05-06 MED ORDER — GABAPENTIN 300 MG PO CAPS: 600.0000 mg | ORAL_CAPSULE | Freq: Three times a day (TID) | ORAL | 1 refills | Status: DC

## 2016-05-06 NOTE — Patient Instructions (Signed)
IF you received an x-ray today, you will receive an invoice from [facility]. Please contact [facility] at [phone] with questions or concerns regarding your invoice.   IF you received labwork today, you will receive an invoice from [employer]. Please contact [facility] at [phone] with questions or concerns regarding your invoice.   Our billing staff will not be able to assist you with questions regarding bills from these companies.  You will be contacted with the lab results as soon as they are available. The fastest way to get your results is to activate your My Chart account. Instructions are located on the last page of this paperwork. If you have not heard from [place] regarding the results in 2 weeks, please contact this office.       Scapular Winging With Rehab Scapular winging syndrome is also known as serratus anterior palsy or long thoracic nerve injury. The condition is an uncommon injury to the nervous system. The condition is caused by injury to the long thoracic nerve that runs through the neck and shoulder. Injury to the shoulder, such as a fall or repetitive stress on the shoulder causes the nerve to become stretched. Occasionally the injury is the result of an infection of the nerve. Damage to the long thoracic nerve results in weakness of the serratus anterior muscle. The serratus anterior muscle is responsible for controlling the shoulder blade (scapula). Weakness in this muscle results in a instability (winging) of the scapula. SYMPTOMS   Pain and weakness in the shoulder (usually the back of the shoulder) that is often diffuse or unable to localize.  Loss of or decrease in shoulder function.  Upper back pain while sitting, due to the scapula pressing on the back of the chair.  Visible deformity in the back of the shoulder. CAUSES  Scapular winging is caused by stretching of the long thoracic nerve. Common mechanisms  of injury include:  Viral illness.  Repetitive and/or stressful use of the shoulder.  Falling onto the shoulder with the head and neck stretched away from the shoulder. RISK INCREASES WITH:  Contact sports (football, rugby, lacrosse, or soccer).  Activities involving overhead arm movement (baseball, volleyball, or racquet sports).  Poor strength and flexibility. PREVENTION  Warm up and stretch properly before activity.  Allow for adequate recovery between workouts.  Maintain physical fitness:  Strength, flexibility, and endurance.  Cardiovascular fitness.  Learn and use proper technique. When possible, have a coach correct improper technique. PROGNOSIS  Scapular winging normally resolves spontaneously within 18 months. In rare circumstances surgery is recommended.  RELATED COMPLICATIONS   Permanent nerve damage, including pain, numbness, tingle, or weakness.  Shoulder weakness.  Recurrent shoulder pain.  Inability to compete in athletics. TREATMENT Treatment initially involves resting from any activities that aggravate your symptoms. The use of ice and medication may help reduce pain and inflammation. The use of strengthening and stretching exercises may help reduce pain with activity, specifically shoulder exercises that improve range of motion. These exercises may be performed at home or with referral to a therapist. If symptoms persist for greater than 6 months despite non-surgical (conservative) treatment, then surgery may be recommended. Surgery is only used for the most serious cases and the purpose is to regain function, not to allow an athlete to return to sports. MEDICATION   If pain medication is necessary, then nonsteroidal anti-inflammatory medications, such as aspirin and ibuprofen, or other minor pain relievers, such as acetaminophen, are often recommended.  Do not take pain medication for 7 days before surgery.  Prescription pain relievers may be given if  deemed necessary by your caregiver. Use only as directed and only as much as you need. HEAT AND COLD  Cold treatment (icing) relieves pain and reduces inflammation. Cold treatment should be applied for 10 to 15 minutes every 2 to 3 hours for inflammation and pain and immediately after any activity that aggravates your symptoms. Use ice packs or massage the area with a piece of ice (ice massage).  Heat treatment may be used prior to performing the stretching and strengthening activities prescribed by your caregiver, physical therapist, or athletic trainer. Use a heat pack or soak the injury in warm water. SEEK MEDICAL CARE IF:  Treatment seems to offer no benefit, or the condition worsens.  Any medications produce adverse side effects. EXERCISES  RANGE OF MOTION (ROM) AND STRETCHING EXERCISES - Scapular Winging (Serratus Anterior Palsy, Long Thoracic Nerve Injury)  These exercises may help you when beginning to rehabilitate your injury. Your symptoms may resolve with or without further involvement from your physician, physical therapist or athletic trainer. While completing these exercises, remember:   Restoring tissue flexibility helps normal motion to return to the joints. This allows healthier, less painful movement and activity.  An effective stretch should be held for at least 30 seconds.  A stretch should never be painful. You should only feel a gentle lengthening or release in the stretched tissue. ROM - Pendulum  Bend at the waist so that your right / left arm falls away from your body. Support yourself with your opposite hand on a solid surface, such as a table or a countertop.  Your right / left arm should be perpendicular to the ground. If it is not perpendicular, you need to lean over farther. Relax the muscles in your right / left arm and shoulder as much as possible.  Gently sway your hips and trunk so they move your right / left arm without any use of your right / left  shoulder muscles.  Progress your movements so that your right / left arm moves side to side, then forward and backward, and finally, both clockwise and counterclockwise.  Complete __________ repetitions in each direction. Many people use this exercise to relieve discomfort in their shoulder as well as to gain range of motion. Repeat __________ times. Complete this exercise __________ times per day. STRETCH - Flexion, Seated   Sit in a firm chair so that your right / left forearm can rest on a table or on a table or countertop. Your right / left elbow should rest below the height of your shoulder so that your shoulder feels supported and not tense or uncomfortable.  Keeping your right / left shoulder relaxed, lean forward at your waist, allowing your right / left hand to slide forward. Bend forward until you feel a moderate stretch in your shoulder, but before you feel an increase in your pain.  Hold __________ seconds. Slowly return to your starting position. Repeat __________ times. Complete this exercise __________ times per day.  STRETCH - Flexion, Standing  Stand with good posture. With an underhand grip on your right / left and an overhand grip on the opposite hand, grasp a broomstick or cane so that your hands are a little more than shoulder-width apart.  Keeping your right / left elbow straight and shoulder muscles relaxed, push the stick with your opposite hand to raise your right / left arm in front of  your body and then overhead. Raise your arm until you feel a stretch in your right / left shoulder, but before you have increased shoulder pain.  Avoid shrugging your right / left shoulder as your arm rises by keeping your shoulder blade tucked down and toward your mid-back spine. Hold __________ seconds.  Slowly return to the starting position. Repeat __________ times. Complete this exercise __________ times per day. STRETCH - Abduction, Supine  Stand with good posture. With an  underhand grip on your right / left and an overhand grip on the opposite hand, grasp a broomstick or cane so that your hands are a little more than shoulder-width apart.  Keeping your right / left elbow straight and shoulder muscles relaxed, push the stick with your opposite hand to raise your right / left arm out to the side of your body and then overhead. Raise your arm until you feel a stretch in your right / left shoulder, but before you have increased shoulder pain.  Avoid shrugging your right / left shoulder as your arm rises by keeping your shoulder blade tucked down and toward your mid-back spine. Hold __________ seconds.  Slowly return to the starting position. Repeat __________ times. Complete this exercise __________ times per day. ROM - Flexion, Active-Assisted  Lie on your back. You may bend your knees for comfort.  Grasp a broomstick or cane so your hands are about shoulder-width apart. Your right / left hand should grip the end of the stick/cane so that your hand is positioned "thumbs-up," as if you were about to shake hands.  Using your healthy arm to lead, raise your right / left arm overhead until you feel a gentle stretch in your shoulder. Hold __________ seconds.  Use the stick/cane to assist in returning your right / left arm to its starting position. Repeat __________ times. Complete this exercise __________ times per day.  STRENGTHENING EXERCISES - Scapular Winging (Serratus Anterior Palsy, Long Thoracic Nerve Injury) These exercises may help you when beginning to rehabilitate your injury. They may resolve your symptoms with or without further involvement from your physician, physical therapist or athletic trainer. While completing these exercises, remember:   Muscles can gain both the endurance and the strength needed for everyday activities through controlled exercises.  Complete these exercises as instructed by your physician, physical therapist or athletic trainer.  Progress with the resistance and repetition exercises only as your caregiver advises.  You may experience muscle soreness or fatigue, but the pain or discomfort you are trying to eliminate should never worsen during these exercises. If this pain does worsen, stop and make certain you are following the directions exactly. If the pain is still present after adjustments, discontinue the exercise until you can discuss the trouble with your clinician.  During your recovery, avoid activity or exercises which involve actions that place your injured hand or elbow above your head or behind your back or head. These positions stress the tissues which are trying to heal. STRENGTH - Scapular Depression and Adduction   With good posture, sit on a firm chair. Supported your arms in front of you with pillows, arm rests or a table top. Have your elbows in line with the sides of your body.  Gently draw your shoulder blades down and toward your mid-back spine. Gradually increase the tension without tensing the muscles along the top of your shoulders and the back of your neck.  Hold for __________ seconds. Slowly release the tension and relax your muscles completely before  completing the next repetition.  After you have practiced this exercise, remove the arm support and complete it in standing as well as sitting. Repeat __________ times. Complete this exercise __________ times per day.  STRENGTH - Scapular Protractors, Standing   Stand arms-length away from a wall. Place your hands on the wall, keeping your elbows straight.  Begin by dropping your shoulder blades down and toward your mid-back spine.  To strengthen your protractors, keep your shoulder blades down, but slide them forward on your rib cage. It will feel as if you are lifting the back of your rib cage away from the wall. This is a subtle motion and can be challenging to complete. Ask your clinician for further instruction if you are not sure you are  doing the exercise correctly.  Hold for __________ seconds. Slowly return to the starting position, resting the muscles completely before completing the next repetition. Repeat __________ times. Complete this exercise __________ times per day. STRENGTH - Scapular Protractors, Supine  Lie on your back on a firm surface. Extend your right / left arm straight into the air while holding a __________ weight in your hand.  Keeping your head and back in place, lift your shoulder off the floor.  Hold __________ seconds. Slowly return to the starting position and allow your muscles to relax completely before completing the next repetition. Repeat __________ times. Complete this exercise __________ times per day. STRENGTH - Scapular Protractors, Quadruped  Get onto your hands and knees with your shoulders directly over your hands (or as close as you comfortably can be).  Keeping your elbows locked, lift the back of your rib cage up into your shoulder blades so your mid-back rounds-out. Keep your neck muscles relaxed.  Hold this position for __________ seconds. Slowly return to the starting position and allow your muscles to relax completely before completing the next repetition. Repeat __________ times. Complete this exercise __________ times per day.  STRENGTH - Scapular Depressors  Keeping your feet on the floor, lift your bottom from the seat and lock your elbows.  Keeping your elbows straight, allow gravity to pull your body weight down. Your shoulders will rise toward your ears.  Raise your body against gravity by drawing your shoulder blades down your back, shortening the distance between your shoulders and ears. Although your feet should always maintain contact with the floor, your feet should progressively support less body weight as you get stronger.  Hold __________ seconds. In a controlled and slow manner, lower your body weight to begin the next repetition. Repeat __________ times.  Complete this exercise __________ times per day.  STRENGTH - Shoulder Extensors, Prone  Lie on your stomach on a firm surface so that your right / left arm overhangs the edge. Rest your forehead on your opposite forearm. With your thumb facing away from your body and your elbow straight, hold a __________ weight in your hand.  Squeeze your right / left shoulder blade to your mid-back spine and then slowly raise your arm behind you to the height of the bed.  Hold for __________ seconds. Slowly reverse the directions and return to the starting position, controlling the weight as you lower your arm. Repeat __________ times. Complete this exercise __________ times per day.  STRENGTH - Horizontal Abductors Choose one of the two oppositions to complete this exercise. Prone: lying on stomach:  Lie on your stomach on a firm surface so that your right / left arm overhangs the edge. Rest your forehead on  your opposite forearm. With your palm facing the floor and your elbow straight, hold a __________ weight in your hand.  Squeeze your right / left shoulder blade to your mid-back spine and then slowly raise your arm to the height of the bed.  Hold for __________ seconds. Slowly reverse the directions and return to the starting position, controlling the weight as you lower your arm. Repeat __________ times. Complete this exercise __________ times per day. Standing:  Secure a rubber exercise band/tubing so that it is at the height of your shoulders when you are either standing or sitting on a firm arm-less chair.  Grasp an end of the band/tubing in each hand and have your palms face each other. Straighten your elbows and lift your hands straight in front of you at shoulder height. Step back away from the secured end of band/tubing until it becomes tense.  Squeeze your shoulder blades together. Keeping your elbows locked and your hands at shoulder-height, bring your hands out to your side.  Hold  __________ seconds. Slowly ease the tension on the band/tubing as you reverse the directions and return to the starting position. Repeat __________ times. Complete this exercise __________ times per day. STRENGTH - Scapular Retractors  Secure a rubber exercise band/tubing so that it is at the height of your shoulders when you are either standing or sitting on a firm arm-less chair.  With a palm-down grip, grasp an end of the band/tubing in each hand. Straighten your elbows and lift your hands straight in front of you at shoulder height. Step back away from the secured end of band/tubing until it becomes tense.  Squeezing your shoulder blades together, draw your elbows back as you bend them. Keep your upper arm lifted away from your body throughout the exercise.  Hold __________ seconds. Slowly ease the tension on the band/tubing as you reverse the directions and return to the starting position. Repeat __________ times. Complete this exercise __________ times per day. STRENGTH - Shoulder Extensors   Secure a rubber exercise band/tubing so that it is at the height of your shoulders when you are either standing or sitting on a firm arm-less chair.  With a thumbs-up grip, grasp an end of the band/tubing in each hand. Straighten your elbows and lift your hands straight in front of you at shoulder height. Step back away from the secured end of band/tubing until it becomes tense.  Squeezing your shoulder blades together, pull your hands down to the sides of your thighs. Do not allow your hands to go behind you.  Hold for __________ seconds. Slowly ease the tension on the band/tubing as you reverse the directions and return to the starting position. Repeat __________ times. Complete this exercise __________ times per day.  STRENGTH - Scapular Retractors and External Rotators  Secure a rubber exercise band/tubing so that it is at the height of your shoulders when you are either standing or sitting on  a firm arm-less chair.  With a palm-down grip, grasp an end of the band/tubing in each hand. Bend your elbows 90 degrees and lift your elbows to shoulder height at your sides. Step back away from the secured end of band/tubing until it becomes tense.  Squeezing your shoulder blades together, rotate your shoulder so that your upper arm and elbow remain stationary, but your fists travel upward to head-height.  Hold __________ for seconds. Slowly ease the tension on the band/tubing as you reverse the directions and return to the starting position. Repeat __________  times. Complete this exercise __________ times per day.  STRENGTH - Scapular Retractors and External Rotators, Rowing  Secure a rubber exercise band/tubing so that it is at the height of your shoulders when you are either standing or sitting on a firm arm-less chair.  With a palm-down grip, grasp an end of the band/tubing in each hand. Straighten your elbows and lift your hands straight in front of you at shoulder height. Step back away from the secured end of band/tubing until it becomes tense.  Step 1: Squeeze your shoulder blades together. Bending your elbows, draw your hands to your chest as if you are rowing a boat. At the end of this motion, your hands and elbow should be at shoulder-height and your elbows should be out to your sides.  Step 2: Rotate your shoulder to raise your hands above your head. Your forearms should be vertical and your upper-arms should be horizontal.  Hold for __________ seconds. Slowly ease the tension on the band/tubing as you reverse the directions and return to the starting position. Repeat __________ times. Complete this exercise __________ times per day.  STRENGTH - Scapular Retractors and Elevators  Secure a rubber exercise band/tubing so that it is at the height of your shoulders when you are either standing or sitting on a firm arm-less chair.  With a thumbs-up grip, grasp an end of the  band/tubing in each hand. Step back away from the secured end of band/tubing until it becomes tense.  Squeezing your shoulder blades together, straighten your elbows and lift your hands straight over your head.  Hold for __________ seconds. Slowly ease the tension on the band/tubing as you reverse the directions and return to the starting position. Repeat __________ times. Complete this exercise __________ times per day.    This information is not intended to replace advice given to you by your health care provider. Make sure you discuss any questions you have with your health care provider.   Document Released: 08/17/2005 Document Revised: 01/01/2015 Document Reviewed: 11/29/2008 [employer] Interactive Patient Education [employer].

## 2016-05-06 NOTE — Progress Notes (Signed)
Subjective:  By signing my name below, I, Moises Blood, attest that this documentation has been prepared under the direction and in the presence of Reginia Forts, MD. Electronically Signed: Moises Blood, [place]. 05/06/2016 , 11:46 AM .  Patient was seen in Room 3 .   Patient ID: Justin Dunn, male    DOB: 01-03-1961, 55 y.o.   MRN: [id number]  05/06/2016  Other (lower back, at times feel pressure. Back has been fused in 2012.) and Medication Refill (Trazodone 100 mg)   HPI Justin Dunn is a 55 y.o. male who presents to [facility] complaining of low back pain. He was seen by Dr. Patrice Paradise at [facility] in July 2017. He was recommended physical therapy, which he went for a week but the back pain worsened. He was informed it is due to his lower back being fused and now starting to develop arthritis in his back. He discontinued physical therapy and was advised next step is to have an injection in his back. He declined the injection. He's also noticed back stiffness as weather has been changing recently. He's also noticed tremors in his hands when the pain worsens and when he becomes nervous. He's been taking tramadol 2 tablets for some temporary relief. 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 He takes zanaflex when he feels oncoming spasms. He takes gabapentin TID. He denies chest pain, dizziness or syncope.   He also has history of "tingling feeling" radiating from his left shoulder down to his left arm. He's been wearing an arm brace over his left bicep for some relief. He's also been applying a cream over his left scapular region with temporary relief. He sleeps fairly well but tries not to rely on his [place]. He denies leg swelling, nausea, vomiting, or constipation.   He was see by Dr. Rosana Hoes at [facility] with PSA of 1.05 on 03/26/16, follow up in about 90 days. His next appointment with Dr. Rosana Hoes will be towards the end  of October 2017. He still has occasional incontinence but able to urinate without difficulty. He's taking zoloft 100mg . He is doing well emotionally.  He denies flu shot today.   Review of Systems  Constitutional: Negative for activity change, appetite change, chills, diaphoresis, fatigue, fever and unexpected weight change.  Eyes: Negative for visual disturbance.  Respiratory: Negative for cough, chest tightness and shortness of breath.   Cardiovascular: Negative for chest pain, palpitations and leg swelling.  Gastrointestinal: Negative for abdominal pain, blood in stool, constipation, diarrhea, nausea and vomiting.  Endocrine: Negative for cold intolerance, heat intolerance, polydipsia, polyphagia and polyuria.  Genitourinary: Negative for difficulty urinating.  Musculoskeletal: Positive for arthralgias, back pain and myalgias.  Skin: Negative for color change, rash and wound.  Neurological: Positive for tremors. Negative for dizziness, seizures, syncope, facial asymmetry, speech difficulty, weakness, light-headedness, numbness and headaches.  Psychiatric/Behavioral: Negative for dysphoric mood and sleep disturbance. The patient is not nervous/anxious.     Past Medical History:  Diagnosis Date  . Anxiety   . At risk for sleep apnea    STOP-BANG= 4   SENT TO PCP 04-02-2014  . Chronic low back pain   . Depression   . ED (erectile dysfunction)   . H/O: depression   . History of colon polyps   . History of gastric ulcer    as child  . Hypertension   . Hypogonadism male   . Incomplete  bladder emptying   . Lumbar spine scoliosis   . Premature ejaculation   . Prostate cancer [facility])    Dr Tammi Klippel ( oncologist) and Dr Loel Lofty [facility])   . Retention of urine, unspecified   . Sleep apnea   . Wears glasses    Past Surgical History:  Procedure Laterality Date  . APPENDECTOMY  age 42  . COLONOSCOPY W/ POLYPECTOMY  july 2015  . LUMBAR FUSION  04/ 2012   and rod  . PROSTATE  BIOPSY    . RADIOACTIVE SEED IMPLANT N/A 04/06/2014   Procedure: RADIOACTIVE SEED IMPLANT;  Surgeon: [name], MD;  Location: [facility];  Service: Urology;  Laterality: N/A;  dr portable    No Known Allergies Current Outpatient Prescriptions  Medication Sig Dispense Refill  . Calcium-Cholecalciferol (VITA-CALCIUM PO) Take by mouth.    . enalapril (VASOTEC) 20 MG tablet Take 1 tablet (20 mg total) by mouth daily. 90 tablet 3  . gabapentin (NEURONTIN) 300 MG capsule Take 2 capsules (600 mg total) by mouth 3 (three) times daily. 180 capsule 1  . meloxicam (MOBIC) 15 MG tablet Take 15 mg by mouth daily.    . Multiple Vitamins-Minerals-FA (VITATAB MV PO) Take by mouth.    . Nutritional Supplements (NUTRITIONAL SUPPLEMENT PLUS PO) Take 1 capsule by mouth daily. Gets at health food store unsure of name of medication    . Phenazopyridine HCl (AZO URINARY PAIN PO) Take by mouth.    . sertraline (ZOLOFT) 100 MG tablet Take 2 tablets (200 mg total) by mouth daily. 180 tablet 3  . tiZANidine (ZANAFLEX) 4 MG capsule Take 1 capsule (4 mg total) by mouth 3 (three) times daily as needed for muscle spasms. 60 capsule 5  . traMADol (ULTRAM) 50 MG tablet Take 1 tablet (50 mg total) by mouth every 12 (twelve) hours as needed. 60 tablet 5  . traZODone (DESYREL) 100 MG tablet Take 1 tablet (100 mg total) by mouth at bedtime. 90 tablet 1  . Turmeric (RA TURMERIC) 500 MG CAPS Take 1 tablet by mouth. Reported on 08/19/2015    . zolpidem (AMBIEN) 10 MG tablet take 1 tablet by mouth at bedtime if needed for sleep 30 tablet 5  . enalapril-hydrochlorothiazide (VASERETIC) 10-25 MG per tablet take 1 tablet by mouth every morning (Patient not taking: Reported on 05/06/2016) 90 tablet 1   No current facility-administered medications for this visit.    Social History   Social History  . Marital status: Divorced    Spouse name: N/A  . Number of children: N/A  . Years of education: N/A   Occupational  History  . disability     DDD lumbar spine; 2013   Social History Main Topics  . Smoking status: Current Some Day Smoker    Packs/day: 1.00    Years: 10.00    Types: Cigarettes, Cigars  . Smokeless tobacco: Never Used     Comment: currently smokes 2 small cigar per day/  quit cigarettes 2011  . Alcohol use 0.0 oz/week     Comment: occasional  . Drug use: No  . Sexual activity: No   Other Topics Concern  . Not on file   Social History Narrative  . No narrative on file   Family History  Problem Relation Age of Onset  . Cancer Father     prostate       Objective:    BP 140/80 (BP Location: Right Arm, Patient Position: Sitting, Cuff  Size: Large)   Pulse 67   Temp 98.3 F (36.8 C) (Oral)   Resp 16   Ht 5\' 10"  (1.778 m)   Wt 207 lb 12.8 oz (94.3 kg)   SpO2 98%   BMI 29.82 kg/m   Physical Exam  Constitutional: He is oriented to person, place, and time. He appears well-developed and well-nourished. No distress.  HENT:  Head: Normocephalic and atraumatic.  Right Ear: External ear normal.  Left Ear: External ear normal.  Nose: Nose normal.  Mouth/Throat: Oropharynx is clear and moist.  Eyes: Conjunctivae and EOM are normal. Pupils are equal, round, and reactive to light.  Neck: Normal range of motion. Neck supple. Carotid bruit is not present. No thyromegaly present.  Cardiovascular: Normal rate, regular rhythm, normal heart sounds and intact distal pulses.  Exam reveals no gallop and no friction rub.   No murmur heard. Pulmonary/Chest: Effort normal and breath sounds normal. No respiratory distress. He has no wheezes. He has no rales.  Abdominal: Soft. Bowel sounds are normal. He exhibits no distension and no mass. There is no tenderness. There is no rebound and no guarding.  Musculoskeletal: Normal range of motion.       Lumbar back: He exhibits pain. He exhibits normal range of motion, no tenderness, no bony tenderness, no spasm and normal pulse.  Spasm over left  subscapular region Full ROM of c-spine  Lymphadenopathy:    He has no cervical adenopathy.  Neurological: He is alert and oriented to person, place, and time. No cranial nerve deficit.  Skin: Skin is warm and dry. No rash noted. He is not diaphoretic.  Psychiatric: He has a normal mood and affect. His behavior is normal.  Nursing note and vitals reviewed.   BP recheck, sitting (left arm, manual): 128/78    Assessment & Plan:   1. Essential hypertension, benign   2. Tremor   3. Adjustment disorder with mixed anxiety and depressed mood   4. Insomnia due to other mental disorder   5. Neck pain   6. Malignant neoplasm of prostate ([place])   7. Degenerative disc disease, lumbar   8. Degenerative disc disease, cervical    -refer to neurology due to tremors. -obtain labs; refills provided; blood pressure under good control. -rx for Robaxin provided for neck pain. Home exercise program provided; if no improvement in two weeks with neck pain with radicular symptoms to L arm, call for ortho referral.  -refill of Tramadol provided for DDD lumbar spine. -depression stable; refill of Zoloft provided. -insomnia moderately controlled with Trazodone and Ambien.    Orders Placed This Encounter  Procedures  . DG Cervical Spine Complete    Standing Status:   Future    Number of Occurrences:   1    Standing Expiration Date:   05/06/2017    Order Specific Question:   Reason for Exam (SYMPTOM  OR DIAGNOSIS REQUIRED)    Answer:   L scapular pain with radiation into L forearm with burning, numbness intermittent    Order Specific Question:   Preferred imaging location?    Answer:   External  . CBC with Differential/Platelet  . Comprehensive metabolic panel  . Ambulatory referral to Neurology    Referral Priority:   Routine    Referral Type:   Consultation    Referral Reason:   Specialty Services Required    Requested Specialty:   Neurology    Number of Visits Requested:   1  . POCT urinalysis  dipstick  Meds ordered this encounter  Medications  . Calcium-Cholecalciferol (VITA-CALCIUM PO)    Sig: Take by mouth.  . Multiple Vitamins-Minerals-FA (VITATAB MV PO)    Sig: Take by mouth.  . sertraline (ZOLOFT) 100 MG tablet    Sig: Take 2 tablets (200 mg total) by mouth daily.    Dispense:  180 tablet    Refill:  3  . tiZANidine (ZANAFLEX) 4 MG capsule    Sig: Take 1 capsule (4 mg total) by mouth 3 (three) times daily as needed for muscle spasms.    Dispense:  60 capsule    Refill:  5  . traMADol (ULTRAM) 50 MG tablet    Sig: Take 1 tablet (50 mg total) by mouth every 12 (twelve) hours as needed.    Dispense:  60 tablet    Refill:  5  . traZODone (DESYREL) 100 MG tablet    Sig: Take 1 tablet (100 mg total) by mouth at bedtime.    Dispense:  90 tablet    Refill:  1  . zolpidem (AMBIEN) 10 MG tablet    Sig: take 1 tablet by mouth at bedtime if needed for sleep    Dispense:  30 tablet    Refill:  5  . enalapril (VASOTEC) 20 MG tablet    Sig: Take 1 tablet (20 mg total) by mouth daily.    Dispense:  90 tablet    Refill:  3  . gabapentin (NEURONTIN) 300 MG capsule    Sig: Take 2 capsules (600 mg total) by mouth 3 (three) times daily.    Dispense:  180 capsule    Refill:  1    Return in about 4 months (around 09/05/2016) for recheck chronic pain, insomnia, anxiety.   I personally performed the services described in this documentation, which was scribed in my presence. The recorded information has been reviewed and considered.  Calypso Hagarty Elayne Guerin, M.D. [facility] [address] [place], [place]  [address] [phone] phone [phone] fax 05/06/2016 8:15pm

## 2016-05-07 LAB — CBC WITH DIFFERENTIAL/PLATELET
BASOS PCT: 0 %
Basophils Absolute: 0 cells/uL (ref 0–200)
EOS PCT: 2 %
Eosinophils Absolute: 124 cells/uL (ref 15–500)
HCT: 44.9 % (ref 38.5–50.0)
HEMOGLOBIN: 15.1 g/dL (ref 13.2–17.1)
LYMPHS ABS: 2418 {cells}/uL (ref 850–3900)
Lymphocytes Relative: 39 %
MCH: 31.4 pg (ref 27.0–33.0)
MCHC: 33.6 g/dL (ref 32.0–36.0)
MCV: 93.3 fL (ref 80.0–100.0)
MONOS PCT: 11 %
MPV: 9.5 fL (ref 7.5–12.5)
Monocytes Absolute: 682 cells/uL (ref 200–950)
NEUTROS ABS: 2976 {cells}/uL (ref 1500–7800)
Neutrophils Relative %: 48 %
PLATELETS: 285 10*3/uL (ref 140–400)
RBC: 4.81 MIL/uL (ref 4.20–5.80)
RDW: 14.8 % (ref 11.0–15.0)
WBC: 6.2 10*3/uL (ref 3.8–10.8)

## 2016-05-07 LAB — COMPREHENSIVE METABOLIC PANEL
ALT: 14 U/L (ref 9–46)
AST: 21 U/L (ref 10–35)
Albumin: 4.4 g/dL (ref 3.6–5.1)
Alkaline Phosphatase: 68 U/L (ref 40–115)
BILIRUBIN TOTAL: 0.4 mg/dL (ref 0.2–1.2)
BUN: 19 mg/dL (ref 7–25)
CHLORIDE: 103 mmol/L (ref 98–110)
CO2: 27 mmol/L (ref 20–31)
CREATININE: 1.07 mg/dL (ref 0.70–1.33)
Calcium: 9.3 mg/dL (ref 8.6–10.3)
Glucose, Bld: 78 mg/dL (ref 65–99)
Potassium: 4.5 mmol/L (ref 3.5–5.3)
SODIUM: 139 mmol/L (ref 135–146)
TOTAL PROTEIN: 7.1 g/dL (ref 6.1–8.1)

## 2016-05-13 ENCOUNTER — Telehealth: Payer: Self-pay | Admitting: *Deleted

## 2016-05-13 NOTE — Telephone Encounter (Signed)
Call -- neck xray shows arthritic changes throughout.

## 2016-05-15 NOTE — Telephone Encounter (Signed)
Spoke with pt. Is going to call for an appointment next week.

## 2016-05-15 NOTE — Telephone Encounter (Signed)
Detailed voice message left for pt telling him that x ray showed arthritic changes. Told to call back if any questions.

## 2016-06-01 DIAGNOSIS — M5030 Other cervical disc degeneration, unspecified cervical region: Secondary | ICD-10-CM | POA: Insufficient documentation

## 2016-06-01 DIAGNOSIS — F5105 Insomnia due to other mental disorder: Secondary | ICD-10-CM | POA: Insufficient documentation

## 2016-06-01 DIAGNOSIS — F4323 Adjustment disorder with mixed anxiety and depressed mood: Secondary | ICD-10-CM | POA: Insufficient documentation

## 2016-06-01 DIAGNOSIS — F99 Mental disorder, not otherwise specified: Secondary | ICD-10-CM

## 2016-06-01 DIAGNOSIS — M5136 Other intervertebral disc degeneration, lumbar region: Secondary | ICD-10-CM | POA: Insufficient documentation
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
## 2016-06-18 ENCOUNTER — Encounter: Payer: Self-pay | Admitting: Neurology

## 2016-06-18 ENCOUNTER — Ambulatory Visit (INDEPENDENT_AMBULATORY_CARE_PROVIDER_SITE_OTHER): Payer: Medicare HMO | Admitting: Neurology

## 2016-06-18 VITALS — BP 124/80 | HR 80 | Ht 71.0 in | Wt 210.0 lb

## 2016-06-18 DIAGNOSIS — R251 Tremor, unspecified: Secondary | ICD-10-CM | POA: Diagnosis not present

## 2016-06-18 DIAGNOSIS — Z720 Tobacco use: Secondary | ICD-10-CM | POA: Diagnosis not present

## 2016-06-18 NOTE — Patient Instructions (Signed)
next time you see your primary care doctor I recommend B12, TSH and vitamin D

## 2016-08-07 ENCOUNTER — Encounter: Payer: Self-pay | Admitting: Family Medicine

## 2016-08-17 ENCOUNTER — Encounter: Payer: Self-pay | Admitting: Family Medicine

## 2016-09-08 ENCOUNTER — Ambulatory Visit: Payer: Medicare HMO | Admitting: Family Medicine

## 2016-09-08 ENCOUNTER — Ambulatory Visit (INDEPENDENT_AMBULATORY_CARE_PROVIDER_SITE_OTHER): Payer: Medicare HMO | Admitting: Family Medicine

## 2016-09-08 ENCOUNTER — Encounter: Payer: Self-pay | Admitting: Family Medicine

## 2016-09-08 VITALS — BP 138/78 | HR 80 | Temp 98.6°F | Resp 18 | Ht 71.0 in | Wt 215.0 lb

## 2016-09-08 DIAGNOSIS — M5136 Other intervertebral disc degeneration, lumbar region: Secondary | ICD-10-CM | POA: Diagnosis not present

## 2016-09-08 DIAGNOSIS — E7800 Pure hypercholesterolemia, unspecified: Secondary | ICD-10-CM

## 2016-09-08 DIAGNOSIS — K148 Other diseases of tongue: Secondary | ICD-10-CM | POA: Diagnosis not present

## 2016-09-08 DIAGNOSIS — R69 Illness, unspecified: Secondary | ICD-10-CM | POA: Diagnosis not present

## 2016-09-08 DIAGNOSIS — F5105 Insomnia due to other mental disorder: Secondary | ICD-10-CM | POA: Diagnosis not present

## 2016-09-08 DIAGNOSIS — F99 Mental disorder, not otherwise specified: Secondary | ICD-10-CM

## 2016-09-08 DIAGNOSIS — M51369 Other intervertebral disc degeneration, lumbar region without mention of lumbar back pain or lower extremity pain: Secondary | ICD-10-CM

## 2016-09-08 DIAGNOSIS — M5030 Other cervical disc degeneration, unspecified cervical region: Secondary | ICD-10-CM | POA: Diagnosis not present

## 2016-09-08 DIAGNOSIS — F4323 Adjustment disorder with mixed anxiety and depressed mood: Secondary | ICD-10-CM | POA: Diagnosis not present

## 2016-09-08 DIAGNOSIS — Z0000 Encounter for general adult medical examination without abnormal findings: Secondary | ICD-10-CM

## 2016-09-08 DIAGNOSIS — C61 Malignant neoplasm of prostate: Secondary | ICD-10-CM | POA: Diagnosis not present

## 2016-09-08 DIAGNOSIS — Z111 Encounter for screening for respiratory tuberculosis: Secondary | ICD-10-CM | POA: Diagnosis not present

## 2016-09-08 DIAGNOSIS — I10 Essential (primary) hypertension: Secondary | ICD-10-CM

## 2016-09-08 LAB — POCT URINALYSIS DIP (MANUAL ENTRY)
Bilirubin, UA: NEGATIVE
Leukocytes, UA: NEGATIVE
NITRITE UA: POSITIVE — AB
RBC UA: NEGATIVE
Spec Grav, UA: 1.02
UROBILINOGEN UA: 2
pH, UA: 5

## 2016-09-08 MED ORDER — GABAPENTIN 300 MG PO CAPS: 900.0000 mg | ORAL_CAPSULE | Freq: Three times a day (TID) | ORAL | 5 refills | Status: DC

## 2016-09-08 MED ORDER — GABAPENTIN 300 MG PO CAPS: 600.0000 mg | ORAL_CAPSULE | Freq: Three times a day (TID) | ORAL | 5 refills | Status: DC

## 2016-09-08 MED ORDER — SERTRALINE HCL 100 MG PO TABS: 200.0000 mg | ORAL_TABLET | Freq: Every day | ORAL | 3 refills | Status: DC

## 2016-09-08 MED ORDER — TRAMADOL HCL 50 MG PO TABS: 50.0000 mg | ORAL_TABLET | Freq: Two times a day (BID) | ORAL | 5 refills | Status: DC | PRN

## 2016-09-08 MED ORDER — ENALAPRIL MALEATE 20 MG PO TABS: 20.0000 mg | ORAL_TABLET | Freq: Every day | ORAL | 3 refills | Status: DC

## 2016-09-08 MED ORDER — ZOLPIDEM TARTRATE 10 MG PO TABS
ORAL_TABLET | ORAL | 5 refills | Status: DC
Start: 2016-09-08 — End: 2017-03-08

## 2016-09-08 NOTE — Progress Notes (Signed)

## 2016-09-08 NOTE — Patient Instructions (Addendum)
   IF you received an x-ray today, you will receive an invoice from [facility]. Please contact [facility] at [phone] with questions or concerns regarding your invoice.   IF you received labwork today, you will receive an invoice from [facility]. Please contact [facility] at [phone] with questions or concerns regarding your invoice.   Our billing staff will not be able to assist you with questions regarding bills from these companies.  You will be contacted with the lab results as soon as they are available. The fastest way to get your results is to activate your My Chart account. Instructions are located on the last page of this paperwork. If you have not heard from us regarding the results in 2 weeks, please contact this office.     Keeping you healthy  Get these tests  Blood pressure- Have your blood pressure checked once a year by your healthcare provider.  Normal blood pressure is 120/80  Weight- Have your body mass index (BMI) calculated to screen for obesity.  BMI is a measure of body fat based on height and weight. You can also calculate your own BMI at www.nhl[facility].com/bmi/.  Cholesterol- Have your cholesterol checked every year.  Diabetes- Have your blood sugar checked regularly if you have high blood pressure, high cholesterol, have a family history of diabetes or if you are overweight.  Screening for Colon Cancer- Colonoscopy starting at age 50.  Screening may begin sooner depending on your family history and other health conditions. Follow up colonoscopy as directed by your Gastroenterologist.  Screening for Prostate Cancer- Both blood work (PSA) and a rectal exam help screen for Prostate Cancer.  Screening begins at age 40 with African-American men and at age 50 with Caucasian men.  Screening may begin sooner depending on your family history.  Take these medicines  Aspirin- One aspirin daily can help prevent Heart disease and Stroke.  Flu  shot- Every fall.  Tetanus- Every 10 years.  Zostavax- Once after the age of 60 to prevent Shingles.  Pneumonia shot- Once after the age of 65; if you are younger than 65, ask your healthcare provider if you need a Pneumonia shot.  Take these steps  Don't smoke- If you do smoke, talk to your doctor about quitting.  For tips on how to quit, go to www.smokefree.gov or call 1-800-QUIT-NOW.  Be physically active- Exercise 5 days a week for at least 30 minutes.  If you are not already physically active start slow and gradually work up to 30 minutes of moderate physical activity.  Examples of moderate activity include walking briskly, mowing the yard, dancing, swimming, bicycling, etc.  Eat a healthy diet- Eat a variety of healthy food such as fruits, vegetables, low fat milk, low fat cheese, yogurt, lean meant, poultry, fish, beans, tofu, etc. For more information go to www.thenutritionsource.org  Drink alcohol in moderation- Limit alcohol intake to less than two drinks a day. Never drink and drive.  Dentist- Brush and floss twice daily; visit your dentist twice a year.  Depression- Your emotional health is as important as your physical health. If you're feeling down, or losing interest in things you would normally enjoy please talk to your healthcare provider.  Eye exam- Visit your eye doctor every year.  Safe sex- If you may be exposed to a sexually transmitted infection, use a condom.  Seat belts- Seat belts can save your life; always wear one.  Smoke/Carbon Monoxide detectors- These detectors need to be installed on the appropriate   level of your home.  Replace batteries at least once a year.  Skin cancer- When out in the sun, cover up and use sunscreen 15 SPF or higher.  Violence- If anyone is threatening you, please tell your healthcare provider.  Living Will/ Health care power of attorney- Speak with your healthcare provider and family. 

## 2016-09-08 NOTE — Progress Notes (Signed)
Subjective:    Patient ID: Justin Dunn, male    DOB: 1961-01-09, 56 y.o.   MRN: [id number]  09/08/2016  Annual Exam and Medication Refill (Ambien)   HPI This 56 y.o. male presents for Complete Physical Examination/Annual Wellness Examination.  Last physical: Colonoscopy: age 59; Theodoro Doing.   Eye exam:  Glasses; readers; due for follow-up. Dental exam:  Next; to schedule.  Immunization History  Administered Date(s) Administered  . PPD Test 08/19/2015, 09/08/2016   BP Readings from Last 3 Encounters:  09/08/16 138/78  06/18/16 124/80  05/06/16 140/80   Wt Readings from Last 3 Encounters:  09/08/16 215 lb (97.5 kg)  06/18/16 210 lb (95.3 kg)  05/06/16 207 lb 12.8 oz (94.3 kg)    HTN: Patient reports good compliance with medication, good tolerance to medication, and good symptom control.    DDD lumbar and cervical spine: signed up at [facility]; doing laps.  Building up muscles.  Pain is horrible.  Dr. Patrice Paradise of Spinal Scoliosis; last visit in 2017.  Physical therapy has been discontinued due to worsening pain.  Colleen recommended to return for injection.  Does not want to be injected all the time.  Last injection in May 2017 or June 2017 by Dr. Etta Quill at [facility].  He performed four injections.  Would like to return to spine and scoliosis.  [facility] does not want to get too involved because Cohen performed surgery lumbar spine. Not sleeping well at bedtime due to pain.  Goes to bed at 8:00pm.  Taking Trazodone and Ambien at bedtime but cannot sleep.  Must be up with son due to specialty needs.  Has horrible pain in back after sleeping. Gabapentin helps with pain.    History of prostate cancer: follow up with Dr. Lawerance Bach on 09/25/16.  Has not heard from oncologist.  Not sure what is occurring with radiation.  No return visit.  Seed implantation.  Expected to meet with dietician to review appropriate food options; never occcured.  No return visit.   Had a  lot of complications afterward surgery.  Had to undergo MRI with dye; mentioned that because of bladder and prostate locations, radation caused inflamed bladder irritation.  When saw Dr. Karsten Ro did not feel irritation   Tremor: s/p follow-up with neurology; recommended Vitamin D 2000 IU, fish oil, B12 supplement.    Anxiety and depression:  Patient reports good compliance with medication, good tolerance to medication, and good symptom control.  Able to visit mother over holiday season in [place] in [place].    Review of Systems  Constitutional: Negative for activity change, appetite change, chills, diaphoresis, fatigue, fever and unexpected weight change.  HENT: Negative for congestion, dental problem, drooling, ear discharge, ear pain, facial swelling, hearing loss, mouth sores, nosebleeds, postnasal drip, rhinorrhea, sinus pressure, sneezing, sore throat, tinnitus, trouble swallowing and voice change.   Eyes: Negative for photophobia, pain, discharge, redness, itching and visual disturbance.  Respiratory: Negative for apnea, cough, choking, chest tightness, shortness of breath, wheezing and stridor.   Cardiovascular: Negative for chest pain, palpitations and leg swelling.  Gastrointestinal: Negative for abdominal pain, blood in stool, constipation, diarrhea, nausea and vomiting.  Endocrine: Negative for cold intolerance, heat intolerance, polydipsia, polyphagia and polyuria.  Genitourinary: Negative for decreased urine volume, difficulty urinating, discharge, dysuria, enuresis, flank pain, frequency, genital sores, hematuria, penile pain, penile swelling, scrotal swelling, testicular pain and urgency.       Nocturia; urinary hesitancy; urinary leakage.    Musculoskeletal:  Positive for back pain. Negative for arthralgias, gait problem, joint swelling, myalgias, neck pain and neck stiffness.  Skin: Negative for color change, pallor, rash and wound.  Allergic/Immunologic: Negative for environmental  allergies, food allergies and immunocompromised state.  Neurological: Negative for dizziness, tremors, seizures, syncope, facial asymmetry, speech difficulty, weakness, light-headedness, numbness and headaches.  Hematological: Negative for adenopathy. Does not bruise/bleed easily.  Psychiatric/Behavioral: Positive for sleep disturbance. Negative for agitation, behavioral problems, confusion, decreased concentration, dysphoric mood, hallucinations, self-injury and suicidal ideas. The patient is not nervous/anxious and is not hyperactive.     Past Medical History:  Diagnosis Date  . Anxiety   . At risk for sleep apnea    STOP-BANG= 4   SENT TO PCP 04-02-2014  . Chronic low back pain   . Depression   . ED (erectile dysfunction)   . History of colon polyps   . History of gastric ulcer    as child  . Hypertension   . Hypogonadism male   . Incomplete bladder emptying   . Lumbar spine scoliosis   . Premature ejaculation   . Prostate cancer [facility])    Dr Tammi Klippel ( oncologist) and Dr Loel Lofty [facility])   . Retention of urine, unspecified   . Sleep apnea    states never told to wear CPAP  . Wears glasses    Past Surgical History:  Procedure Laterality Date  . APPENDECTOMY  age 60  . COLONOSCOPY W/ POLYPECTOMY  july 2015  . LUMBAR FUSION  04/ 2012   and rod  . PROSTATE BIOPSY    . RADIOACTIVE SEED IMPLANT N/A 04/06/2014   Procedure: RADIOACTIVE SEED IMPLANT;  Surgeon: [name], MD;  Location: [facility];  Service: Urology;  Laterality: N/A;  dr portable    No Known Allergies  Social History   Social History  . Marital status: Divorced    Spouse name: N/A  . Number of children: N/A  . Years of education: N/A   Occupational History  . disability     DDD lumbar spine; 2013   Social History Main Topics  . Smoking status: Current Some Day Smoker    Packs/day: 1.00    Years: 10.00    Types: Cigarettes, Cigars  . Smokeless tobacco: Never Used      Comment: currently smokes 2 small cigar per day/  quit cigarettes 2011  . Alcohol use 0.0 oz/week     Comment: 1-2 glasses of wine daily  . Drug use: Yes    Types: Marijuana  . Sexual activity: No   Other Topics Concern  . Not on file   Social History Narrative  . No narrative on file   Family History  Problem Relation Age of Onset  . Prostate cancer Father   . Hypertension Mother   . Pulmonary fibrosis Mother   . Arthritis Mother   . Diabetes Mother   . Deep vein thrombosis Sister        Objective:    BP 138/78 (BP Location: Left Arm)   Pulse 80   Temp 98.6 F (37 C) (Oral)   Resp 18   Ht 5\' 11"  (1.803 m)   Wt 215 lb (97.5 kg)   SpO2 98%   BMI 29.99 kg/m  Physical Exam  Constitutional: He is oriented to person, place, and time. He appears well-developed and well-nourished. No distress.  HENT:  Head: Normocephalic and atraumatic.  Right Ear: External ear normal.  Left Ear: External ear normal.  Nose: Nose normal.  Mouth/Throat: Oropharynx is clear and moist. Oral lesions present.    R lateral tongue lesion 8 mm diameter.  Eyes: Conjunctivae and EOM are normal. Pupils are equal, round, and reactive to light.  Neck: Normal range of motion. Neck supple. Carotid bruit is not present. No thyromegaly present.  Cardiovascular: Normal rate, regular rhythm, normal heart sounds and intact distal pulses.  Exam reveals no gallop and no friction rub.   No murmur heard. Pulmonary/Chest: Effort normal and breath sounds normal. He has no wheezes. He has no rales.  Abdominal: Soft. Bowel sounds are normal. He exhibits no distension and no mass. There is no tenderness. There is no rebound and no guarding.  Musculoskeletal: He exhibits edema.       Right shoulder: Normal.       Left shoulder: Normal.       Cervical back: Normal.  Lymphadenopathy:    He has no cervical adenopathy.  Neurological: He is alert and oriented to person, place, and time. He has normal reflexes. No  cranial nerve deficit. He exhibits normal muscle tone. Coordination normal.  Skin: Skin is warm and dry. No rash noted. He is not diaphoretic.  Psychiatric: He has a normal mood and affect. His behavior is normal. Judgment and thought content normal.    Fall Risk  09/08/2016 06/18/2016 05/06/2016 11/05/2015 08/19/2015  Falls in the past year? No Yes No No No  Number falls in past yr: - 1 - - -  Injury with Fall? - No - - -  Follow up - Falls evaluation completed - - -   Depression screen [facility] 2/9 09/08/2016 05/06/2016 11/05/2015 08/19/2015 06/17/2015  Decreased Interest 0 0 0 0 1  Down, Depressed, Hopeless 0 0 1 0 -  PHQ - 2 Score 0 0 1 0 1  Altered sleeping - - - - 3  Tired, decreased energy - - - - 3  Change in appetite - - - - 3  Feeling bad or failure about yourself  - - - - 1  Trouble concentrating - - - - 3  Moving slowly or fidgety/restless - - - - 0  Suicidal thoughts - - - - 0  PHQ-9 Score - - - - 14  Difficult doing work/chores - - - - -   Functional Status Survey: Is the patient deaf or have difficulty hearing?: No Does the patient have difficulty seeing, even when wearing glasses/contacts?: No Does the patient have difficulty concentrating, remembering, or making decisions?: No Does the patient have difficulty walking or climbing stairs?: No Does the patient have difficulty dressing or bathing?: No Does the patient have difficulty doing errands alone such as visiting a doctor's office or shopping?: No     Assessment & Plan:   1. Encounter for Medicare annual wellness exam   2. Essential (primary) hypertension   3. Degenerative disc disease, lumbar   4. Degenerative disc disease, cervical   5. Malignant neoplasm of prostate ([place])   6. Insomnia due to other mental disorder   7. Adjustment disorder with mixed anxiety and depressed mood   8. Pure hypercholesterolemia   9. Tongue lesion   10. Screening for tuberculosis    -anticipatory guidance provided. -refer again to oral  surgeon; referred several months ago without appointment scheduled. -refer to neurosurgery for consultation regarding ongoing DDD lumbar with pain. -obtain labs for chronic disease states; refills provided. -upcoming appointment with urology; recommend discussing with urology appropriate follow-up with radiation oncology. -PPD placed;  RTC 3 days; warrants form completed also for foster parenting.  Orders Placed This Encounter  Procedures  . CBC with Differential/Platelet  . Comprehensive metabolic panel    Order Specific Question:   Has the patient fasted?    Answer:   Yes  . Lipid panel    Order Specific Question:   Has the patient fasted?    Answer:   Yes  . TSH  . Ambulatory referral to Oral Maxillofacial Surgery    Referral Priority:   Routine    Referral Type:   Surgical    Referral Reason:   Specialty Services Required    Requested Specialty:   Oral Surgery    Number of Visits Requested:   1  . Ambulatory referral to Neurosurgery    Referral Priority:   Routine    Referral Type:   Surgical    Referral Reason:   Specialty Services Required    Requested Specialty:   Neurosurgery    Number of Visits Requested:   1  . Care order/instruction:    AVS printed - let patient go!  Marland Kitchen POCT urinalysis dipstick  . TB Skin Test    Order Specific Question:   Has patient ever tested positive?    Answer:   No  . EKG 12-Lead   Meds ordered this encounter  Medications  . b complex vitamins tablet    Sig: Take 1 tablet by mouth daily.  . Cholecalciferol (VITAMIN D3) 1000 units CAPS    Sig: Take by mouth.  . enalapril (VASOTEC) 20 MG tablet    Sig: Take 1 tablet (20 mg total) by mouth daily.    Dispense:  90 tablet    Refill:  3  . DISCONTD: gabapentin (NEURONTIN) 300 MG capsule    Sig: Take 2 capsules (600 mg total) by mouth 3 (three) times daily.    Dispense:  180 capsule    Refill:  5  . sertraline (ZOLOFT) 100 MG tablet    Sig: Take 2 tablets (200 mg total) by mouth daily.     Dispense:  180 tablet    Refill:  3  . gabapentin (NEURONTIN) 300 MG capsule    Sig: Take 3 capsules (900 mg total) by mouth 3 (three) times daily.    Dispense:  270 capsule    Refill:  5  . zolpidem (AMBIEN) 10 MG tablet    Sig: take 1 tablet by mouth at bedtime if needed for sleep    Dispense:  30 tablet    Refill:  5  . traMADol (ULTRAM) 50 MG tablet    Sig: Take 1 tablet (50 mg total) by mouth every 12 (twelve) hours as needed.    Dispense:  60 tablet    Refill:  5    Return in about 4 months (around 01/06/2017) for recheck blood pressure, anxiety, chronic lower back pain.   Angeni Chaudhuri Elayne Guerin, M.D. [facility] [address] [place], [place]  [address] [phone] phone [phone] fax

## 2016-09-09 LAB — CBC WITH DIFFERENTIAL/PLATELET
BASOS ABS: 0 10*3/uL (ref 0.0–0.2)
Basos: 0 %
EOS (ABSOLUTE): 0.1 10*3/uL (ref 0.0–0.4)
Eos: 2 %
Hematocrit: 43.7 % (ref 37.5–51.0)
Hemoglobin: 14.4 g/dL (ref 13.0–17.7)
IMMATURE GRANS (ABS): 0 10*3/uL (ref 0.0–0.1)
Immature Granulocytes: 0 %
LYMPHS ABS: 2.9 10*3/uL (ref 0.7–3.1)
LYMPHS: 40 %
MCH: 30.8 pg (ref 26.6–33.0)
MCHC: 33 g/dL (ref 31.5–35.7)
MCV: 93 fL (ref 79–97)
Monocytes Absolute: 0.6 10*3/uL (ref 0.1–0.9)
Monocytes: 8 %
NEUTROS ABS: 3.6 10*3/uL (ref 1.4–7.0)
Neutrophils: 50 %
PLATELETS: 278 10*3/uL (ref 150–379)
RBC: 4.68 x10E6/uL (ref 4.14–5.80)
RDW: 15 % (ref 12.3–15.4)
WBC: 7.2 10*3/uL (ref 3.4–10.8)

## 2016-09-09 LAB — COMPREHENSIVE METABOLIC PANEL
ALK PHOS: 77 IU/L (ref 39–117)
ALT: 20 IU/L (ref 0–44)
AST: 25 IU/L (ref 0–40)
Albumin/Globulin Ratio: 2 (ref 1.2–2.2)
Albumin: 4.6 g/dL (ref 3.5–5.5)
BILIRUBIN TOTAL: 0.2 mg/dL (ref 0.0–1.2)
BUN/Creatinine Ratio: 18 (ref 9–20)
BUN: 17 mg/dL (ref 6–24)
CHLORIDE: 99 mmol/L (ref 96–106)
CO2: 26 mmol/L (ref 18–29)
Calcium: 9.8 mg/dL (ref 8.7–10.2)
Creatinine, Ser: 0.95 mg/dL (ref 0.76–1.27)
GFR calc Af Amer: 102 mL/min/{1.73_m2} (ref >59)
GFR calc non Af Amer: 88 mL/min/{1.73_m2} (ref >59)
GLUCOSE: 88 mg/dL (ref 65–99)
Globulin, Total: 2.3 g/dL (ref 1.5–4.5)
Potassium: 5.2 mmol/L (ref 3.5–5.2)
Sodium: 140 mmol/L (ref 134–144)
Total Protein: 6.9 g/dL (ref 6.0–8.5)

## 2016-09-09 LAB — LIPID PANEL
CHOLESTEROL TOTAL: 158 mg/dL (ref 100–199)
Chol/HDL Ratio: 2.7 ratio units (ref 0.0–5.0)
HDL: 58 mg/dL (ref >39)
LDL Calculated: 85 mg/dL (ref 0–99)
Triglycerides: 75 mg/dL (ref 0–149)
VLDL CHOLESTEROL CAL: 15 mg/dL (ref 5–40)

## 2016-09-09 LAB — TSH: TSH: 1.17 u[IU]/mL (ref 0.450–4.500)

## 2016-09-11 ENCOUNTER — Ambulatory Visit (INDEPENDENT_AMBULATORY_CARE_PROVIDER_SITE_OTHER): Payer: Medicare HMO | Admitting: Physician Assistant

## 2016-09-11 DIAGNOSIS — Z111 Encounter for screening for respiratory tuberculosis: Secondary | ICD-10-CM

## 2016-09-11 LAB — TB SKIN TEST: TB Skin Test: NEGATIVE

## 2016-09-11 NOTE — Progress Notes (Signed)
Patient is here for PPD read.

## 2016-09-11 NOTE — Patient Instructions (Signed)
     IF you received an x-ray today, you will receive an invoice from [facility]. Please contact [facility] at [phone] with questions or concerns regarding your invoice.   IF you received labwork today, you will receive an invoice from [facility]. Please contact [facility] at [phone] with questions or concerns regarding your invoice.   Our billing staff will not be able to assist you with questions regarding bills from these companies.  You will be contacted with the lab results as soon as they are available. The fastest way to get your results is to activate your My Chart account. Instructions are located on the last page of this paperwork. If you have not heard from us regarding the results in 2 weeks, please contact this office.     

## 2016-09-25 ENCOUNTER — Telehealth: Payer: Self-pay

## 2016-09-25 ENCOUNTER — Telehealth: Payer: Self-pay | Admitting: *Deleted

## 2016-09-25 DIAGNOSIS — C61 Malignant neoplasm of prostate: Secondary | ICD-10-CM

## 2016-09-25 DIAGNOSIS — N320 Bladder-neck obstruction: Secondary | ICD-10-CM | POA: Diagnosis not present

## 2016-09-25 NOTE — Telephone Encounter (Signed)
See results and note from davina

## 2016-09-25 NOTE — Telephone Encounter (Signed)
Pt calling about lab results informed him they had not been viewed by the dr. And we would return call when the doctor reviewed

## 2016-09-25 NOTE — Telephone Encounter (Signed)
Call --- labs all normal (cholesterol, thyroid, sugar, liver function, kidney function).  I apparently forgot to order the PSA for the patient; please apologize to him. This is my fault. We covered a lot during his physical (referral to neurosurgery, referral to oral surgeon, screening for tuberculosis, upcoming follow-up with urology, etc).  This is my error. I have placed a future order for a PSA for him to have completed if he did not complete at urology office.

## 2016-09-25 NOTE — Telephone Encounter (Signed)
Noted. Duplication of message; please see other message from today.

## 2016-09-26 NOTE — Telephone Encounter (Signed)
Thank you, I have called patient to advise. And to see if he has had appt with Urology. He states he did see Urology yesterday (Dr Rosana Hoes) and they did draw his PSA. He states he did ask for PSA lab when he had his labs drawn. He is very upset, I did discuss and apologize, he sounded better at the end of conversation. He wants you to know he understands, but was very upset.   To you FYI

## 2016-09-27 NOTE — Telephone Encounter (Signed)
Noted. I sincerely regret my error.

## 2016-10-12 DIAGNOSIS — R69 Illness, unspecified: Secondary | ICD-10-CM | POA: Diagnosis not present

## 2016-10-12 DIAGNOSIS — M792 Neuralgia and neuritis, unspecified: Secondary | ICD-10-CM | POA: Diagnosis not present

## 2016-10-12 DIAGNOSIS — G4700 Insomnia, unspecified: Secondary | ICD-10-CM | POA: Diagnosis not present

## 2016-10-12 DIAGNOSIS — K1370 Unspecified lesions of oral mucosa: Secondary | ICD-10-CM | POA: Diagnosis not present

## 2016-10-12 DIAGNOSIS — I10 Essential (primary) hypertension: Secondary | ICD-10-CM | POA: Diagnosis not present

## 2016-10-12 DIAGNOSIS — Z0000 Encounter for general adult medical examination without abnormal findings: Secondary | ICD-10-CM | POA: Diagnosis not present

## 2016-10-12 DIAGNOSIS — Z6830 Body mass index (BMI) 30.0-30.9, adult: Secondary | ICD-10-CM | POA: Diagnosis not present

## 2016-10-12 DIAGNOSIS — M545 Low back pain: Secondary | ICD-10-CM | POA: Diagnosis not present

## 2016-10-12 DIAGNOSIS — E669 Obesity, unspecified: Secondary | ICD-10-CM | POA: Diagnosis not present

## 2016-10-20 DIAGNOSIS — H52229 Regular astigmatism, unspecified eye: Secondary | ICD-10-CM | POA: Diagnosis not present

## 2016-10-20 DIAGNOSIS — Z0100 Encounter for examination of eyes and vision without abnormal findings: Secondary | ICD-10-CM | POA: Diagnosis not present

## 2016-10-29 DIAGNOSIS — M47816 Spondylosis without myelopathy or radiculopathy, lumbar region: Secondary | ICD-10-CM | POA: Diagnosis not present

## 2016-10-29 DIAGNOSIS — M7552 Bursitis of left shoulder: Secondary | ICD-10-CM | POA: Diagnosis not present

## 2016-10-29 DIAGNOSIS — M545 Low back pain: Secondary | ICD-10-CM | POA: Diagnosis not present

## 2016-11-02 ENCOUNTER — Telehealth: Payer: Self-pay | Admitting: Family Medicine

## 2016-11-02 NOTE — Telephone Encounter (Signed)
Pt calling and says insurance will not authorize his prescription the way it is written they only authorized for twice daily but he is supposed to take 3 times a day needs dr. Tamala Julian to call and explain to insurance he needs for 3 times a day pt is running low and needs it filled    Please advise  [phone]

## 2016-11-02 NOTE — Telephone Encounter (Signed)
Medication is neurontin

## 2016-11-02 NOTE — Telephone Encounter (Signed)
What med?

## 2016-11-03 NOTE — Telephone Encounter (Signed)
[employer] company got prior auth from today until 08/30/2017.  Patient will receive a letter from them advising of approval.

## 2016-11-03 NOTE — Telephone Encounter (Signed)
This is a medication authorization and which is being forwarded to [facility].

## 2016-11-03 NOTE — Telephone Encounter (Signed)
Three caps three times a day needs pa  [phone]  Id [id number]

## 2016-11-04 ENCOUNTER — Telehealth: Payer: Self-pay

## 2016-11-04 NOTE — Telephone Encounter (Signed)
Tried to notify patient by phone that his RX for gabapentin has been approved, however, his VM has not been set up at this time.  I will mail him a letter stating this.

## 2016-11-30 DIAGNOSIS — M47816 Spondylosis without myelopathy or radiculopathy, lumbar region: Secondary | ICD-10-CM | POA: Diagnosis not present

## 2016-12-02 ENCOUNTER — Telehealth: Payer: Self-pay | Admitting: Family Medicine

## 2016-12-02 NOTE — Telephone Encounter (Signed)
Called patient and explained that the disability form is something that he should fill out and not [place], but he stated that he wanted the information to come from a doctor so that the disability company would understand what all he is going through and understand that he is not able to work. Because he feels like they are thinking he can work and he does not feel as though he can. I will place the form in Dr Thompson Caul box on 12/02/16 if you could please fill out what you can and return to it to me ASAP because he has a time limit on when it has to be turned back in.  Thank you!

## 2016-12-02 NOTE — Telephone Encounter (Signed)
Patient brought in forms to be completed for disability but after looking at the forms it is not something that we fill out the patient has to do this and send it to his disability dept. This is not a form for a provider to complete. I will refund this patient his $15 and call him to come pick up his forms.

## 2016-12-04 DIAGNOSIS — G894 Chronic pain syndrome: Secondary | ICD-10-CM | POA: Diagnosis not present

## 2016-12-04 DIAGNOSIS — M791 Myalgia: Secondary | ICD-10-CM | POA: Diagnosis not present

## 2016-12-04 DIAGNOSIS — Z0271 Encounter for disability determination: Secondary | ICD-10-CM

## 2016-12-04 DIAGNOSIS — M47816 Spondylosis without myelopathy or radiculopathy, lumbar region: Secondary | ICD-10-CM | POA: Diagnosis not present

## 2016-12-04 DIAGNOSIS — M545 Low back pain: Secondary | ICD-10-CM | POA: Diagnosis not present

## 2016-12-04 NOTE — Telephone Encounter (Signed)
Have we gotten these completed? We are on a tight time crunch to get them back in.  Thank you

## 2016-12-11 ENCOUNTER — Other Ambulatory Visit: Payer: Self-pay | Admitting: Family Medicine

## 2016-12-11 DIAGNOSIS — F99 Mental disorder, not otherwise specified: Principal | ICD-10-CM

## 2016-12-11 DIAGNOSIS — F5105 Insomnia due to other mental disorder: Secondary | ICD-10-CM

## 2016-12-12 NOTE — Telephone Encounter (Signed)
Have we finished these forms, patient needs them back ASAP.  Thank you

## 2016-12-13 NOTE — Telephone Encounter (Signed)
08/2016 last ov 05/2016 last refill

## 2016-12-14 NOTE — Telephone Encounter (Signed)
Form has been completed and will be ready for pick up later this week.  As mentioned in initial email, this form is for patient to complete.  I completed dates of surgeries and reported procedures.  I am not the provider to determine if patient is capable of working.  This form is NOT asking for a physician statement; the form is asking for a patient statement.  Patient must complete remainder of form.  I am not aware if he has discussed his ability to work with Dr. Patrice Paradise or Dr. Mina Marble.

## 2016-12-15 DIAGNOSIS — M47816 Spondylosis without myelopathy or radiculopathy, lumbar region: Secondary | ICD-10-CM | POA: Diagnosis not present

## 2016-12-15 NOTE — Telephone Encounter (Signed)
Paperwork complete and called patient to come pick it up

## 2017-01-06 ENCOUNTER — Encounter: Payer: Self-pay | Admitting: Family Medicine

## 2017-01-06 ENCOUNTER — Ambulatory Visit (INDEPENDENT_AMBULATORY_CARE_PROVIDER_SITE_OTHER): Payer: Medicare HMO | Admitting: Family Medicine

## 2017-01-06 VITALS — BP 128/77 | HR 84 | Temp 98.1°F | Resp 16 | Ht 71.0 in | Wt 211.6 lb

## 2017-01-06 DIAGNOSIS — M5136 Other intervertebral disc degeneration, lumbar region: Secondary | ICD-10-CM

## 2017-01-06 DIAGNOSIS — H1013 Acute atopic conjunctivitis, bilateral: Secondary | ICD-10-CM | POA: Diagnosis not present

## 2017-01-06 DIAGNOSIS — F5105 Insomnia due to other mental disorder: Secondary | ICD-10-CM

## 2017-01-06 DIAGNOSIS — M5030 Other cervical disc degeneration, unspecified cervical region: Secondary | ICD-10-CM

## 2017-01-06 DIAGNOSIS — F99 Mental disorder, not otherwise specified: Secondary | ICD-10-CM | POA: Diagnosis not present

## 2017-01-06 DIAGNOSIS — F4323 Adjustment disorder with mixed anxiety and depressed mood: Secondary | ICD-10-CM | POA: Diagnosis not present

## 2017-01-06 DIAGNOSIS — J301 Allergic rhinitis due to pollen: Secondary | ICD-10-CM | POA: Diagnosis not present

## 2017-01-06 DIAGNOSIS — I10 Essential (primary) hypertension: Secondary | ICD-10-CM

## 2017-01-06 DIAGNOSIS — C61 Malignant neoplasm of prostate: Secondary | ICD-10-CM

## 2017-01-06 DIAGNOSIS — R69 Illness, unspecified: Secondary | ICD-10-CM | POA: Diagnosis not present

## 2017-01-06 MED ORDER — OLOPATADINE HCL 0.2 % OP SOLN: 1.0000 [drp] | Freq: Every day | OPHTHALMIC | 3 refills | Status: DC

## 2017-01-06 MED ORDER — FLUTICASONE PROPIONATE 50 MCG/ACT NA SUSP: 2.0000 | Freq: Every day | NASAL | 11 refills | Status: DC

## 2017-01-06 NOTE — Patient Instructions (Addendum)
   IF you received an x-ray today, you will receive an invoice from [facility]. Please contact [facility] at [phone] with questions or concerns regarding your invoice.   IF you received labwork today, you will receive an invoice from [facility]. Please contact [facility] at [phone] with questions or concerns regarding your invoice.   Our billing staff will not be able to assist you with questions regarding bills from these companies.  You will be contacted with the lab results as soon as they are available. The fastest way to get your results is to activate your My Chart account. Instructions are located on the last page of this paperwork. If you have not heard from us regarding the results in 2 weeks, please contact this office.     Allergic Rhinitis Allergic rhinitis is when the mucous membranes in the nose respond to allergens. Allergens are particles in the air that cause your body to have an allergic reaction. This causes you to release allergic antibodies. Through a chain of events, these eventually cause you to release histamine into the blood stream. Although meant to protect the body, it is this release of histamine that causes your discomfort, such as frequent sneezing, congestion, and an itchy, runny nose. What are the causes? Seasonal allergic rhinitis (hay fever) is caused by pollen allergens that may come from grasses, trees, and weeds. Year-round allergic rhinitis (perennial allergic rhinitis) is caused by allergens such as house dust mites, pet dander, and mold spores. What are the signs or symptoms?  Nasal stuffiness (congestion).  Itchy, runny nose with sneezing and tearing of the eyes. How is this diagnosed? Your health care provider can help you determine the allergen or allergens that trigger your symptoms. If you and your health care provider are unable to determine the allergen, skin or blood testing may be used. Your health care provider will  diagnose your condition after taking your health history and performing a physical exam. Your health care provider may assess you for other related conditions, such as asthma, pink eye, or an ear infection. How is this treated? Allergic rhinitis does not have a cure, but it can be controlled by:  Medicines that block allergy symptoms. These may include allergy shots, nasal sprays, and oral antihistamines.  Avoiding the allergen. Hay fever may often be treated with antihistamines in pill or nasal spray forms. Antihistamines block the effects of histamine. There are over-the-counter medicines that may help with nasal congestion and swelling around the eyes. Check with your health care provider before taking or giving this medicine. If avoiding the allergen or the medicine prescribed do not work, there are many new medicines your health care provider can prescribe. Stronger medicine may be used if initial measures are ineffective. Desensitizing injections can be used if medicine and avoidance does not work. Desensitization is when a patient is given ongoing shots until the body becomes less sensitive to the allergen. Make sure you follow up with your health care provider if problems continue. Follow these instructions at home: It is not possible to completely avoid allergens, but you can reduce your symptoms by taking steps to limit your exposure to them. It helps to know exactly what you are allergic to so that you can avoid your specific triggers. Contact a health care provider if:  You have a fever.  You develop a cough that does not stop easily (persistent).  You have shortness of breath.  You start wheezing.  Symptoms interfere with normal daily activities.   This information is not intended to replace advice given to you by your health care provider. Make sure you discuss any questions you have with your health care provider. Document Released: 05/12/2001 Document Revised: 04/17/2016 Document  Reviewed: 04/24/2013 Elsevier Interactive Patient Education  2017 Elsevier Inc.  

## 2017-01-06 NOTE — Progress Notes (Signed)
Subjective:    Patient ID: Justin Dunn, male    DOB: April 06, 1961, 56 y.o.   MRN: [id number]  01/06/2017  Follow-up   HPI This 56 y.o. male presents for four month follow-up of hypertension, anxiety/depression, and low back pain/DDD lumbar spine.  Patient reports good compliance with medication, good tolerance to medication, and good symptom control.    Went to see spine & scoliosis with referral. s/p spinal injection 12/15/16; received relief; went back later for cortizone shot.  Then injected two shots on either side; moved with injection; arthritis ha set in.  Provided with chart to see how pain is progressing. Pain for one week was good; then pain worsened after one week.  Was going to the [facility].  Zonia Kief [facility].  Wrote note for [employer] to avoid lumbar extension, sit ups, twisting of lower back.  Recommended cauterization to nerves yet pt has decided to not proceed with cauterization.   Out of Tramadol but cannot fill until 01/15/17.  Might take Tramadol in morning and afternoon but that is it.   Son has specialty needs foster son who requires a lot of pt's attention.  With prostate treatment and chronic lower back issues, the state pays pt to provide care for son.  The state sent letter to pt asking if patient can work or not.  Lower back interferes with ability to work; limitations to working is to provide care for son.  Form completed by PCP about ability to work.  Pt does not feel that he can work.  Stopped Sertraline two months ago because felt like mood was too moody.   Prostate cancer: s/p seed implantation by radiation oncology.  Has appointment with Dr. Rosana Hoes on 01/31/17; plans to discuss follow-up with Dr. Rosana Hoes.  Father died of prostate cancer in 12/16/2006.  Constantly worrying about persistent prostate cancer.  Desires to be here for foster son; also has grandson who is seven.  Last visit with Dr. Rosana Hoes in 08/2016.  Bladder issues continue; there are times where flow of  urination varies.  Not sure of effects of radiation seeds to the bladder; wears Depends because not always able to control bladder; actually does not occur often.  Sensation of incomplete emptying.  Gets uncomfortable so still takes AZO.   Tongue lesion R: s/p oral surgeon consultation; diagnosed with fatty growth; would cost $950 to remove for surgical bx.  Paid &85 for consultation.  With treatment of prostate cancer, had blisters on tongue.   Trying to cut down on smoking cigars.   [place] Controlled Substance Registry: 11/27 Tramadol 60 08/09/16 Ambien  1/10 AMbien  30 1/10 Tramadol 60 2/8 Ambien  2/8 Tramadol 60 3/18 Tramadol and Ambien 4/13 Tramadol  4/23 Ambien  BP Readings from Last 3 Encounters:  01/06/17 128/77  09/08/16 138/78  06/18/16 124/80   Wt Readings from Last 3 Encounters:  01/06/17 211 lb 9.6 oz (96 kg)  09/08/16 215 lb (97.5 kg)  06/18/16 210 lb (95.3 kg)   Immunization History  Administered Date(s) Administered  . PPD Test 08/19/2015, 09/08/2016    Review of Systems  Constitutional: Negative for activity change, appetite change, chills, diaphoresis, fatigue and fever.  Eyes: Negative for visual disturbance.  Respiratory: Negative for cough and shortness of breath.   Cardiovascular: Negative for chest pain, palpitations and leg swelling.  Gastrointestinal: Negative for abdominal pain, diarrhea, nausea and vomiting.  Endocrine: Negative for cold intolerance, heat intolerance, polydipsia, polyphagia and polyuria.  Genitourinary: Positive  for decreased urine volume and frequency. Negative for dysuria and flank pain.  Musculoskeletal: Positive for back pain.  Skin: Negative for color change, rash and wound.  Neurological: Negative for dizziness, tremors, seizures, syncope, facial asymmetry, speech difficulty, weakness, light-headedness, numbness and headaches.  Psychiatric/Behavioral: Positive for sleep disturbance. Negative for dysphoric mood. The patient is  not nervous/anxious.     Past Medical History:  Diagnosis Date  . Anxiety   . At risk for sleep apnea    STOP-BANG= 4   SENT TO PCP 04-02-2014  . Chronic low back pain   . Depression   . ED (erectile dysfunction)   . History of colon polyps   . History of gastric ulcer    as child  . Hypertension   . Hypogonadism male   . Incomplete bladder emptying   . Lumbar spine scoliosis   . Premature ejaculation   . Prostate cancer [facility])    Dr Tammi Klippel ( oncologist) and Dr Loel Lofty [facility])   . Retention of urine, unspecified   . Sleep apnea    states never told to wear CPAP  . Wears glasses    Past Surgical History:  Procedure Laterality Date  . APPENDECTOMY  age 33  . COLONOSCOPY W/ POLYPECTOMY  july 2015  . LUMBAR FUSION  04/ 2012   and rod  . PROSTATE BIOPSY    . RADIOACTIVE SEED IMPLANT N/A 04/06/2014   Procedure: RADIOACTIVE SEED IMPLANT;  Surgeon: [name], MD;  Location: [facility];  Service: Urology;  Laterality: N/A;  dr portable    No Known Allergies Current Outpatient Prescriptions  Medication Sig Dispense Refill  . b complex vitamins tablet Take 1 tablet by mouth daily.    . Calcium-Cholecalciferol (VITA-CALCIUM PO) Take by mouth.    . Cholecalciferol (VITAMIN D3) 1000 units CAPS Take by mouth.    . enalapril (VASOTEC) 20 MG tablet Take 1 tablet (20 mg total) by mouth daily. 90 tablet 3  . gabapentin (NEURONTIN) 300 MG capsule Take 3 capsules (900 mg total) by mouth 3 (three) times daily. 270 capsule 5  . meloxicam (MOBIC) 15 MG tablet Take 15 mg by mouth daily.    . Multiple Vitamins-Minerals-FA (VITATAB MV PO) Take by mouth.    . Phenazopyridine HCl (AZO URINARY PAIN PO) Take by mouth.    . saw palmetto 160 MG capsule Take 160 mg by mouth 2 (two) times daily.    . sertraline (ZOLOFT) 100 MG tablet Take 2 tablets (200 mg total) by mouth daily. 180 tablet 3  . tiZANidine (ZANAFLEX) 4 MG capsule Take 1 capsule (4 mg total) by mouth 3  (three) times daily as needed for muscle spasms. 60 capsule 5  . traMADol (ULTRAM) 50 MG tablet Take 1 tablet (50 mg total) by mouth every 12 (twelve) hours as needed. 60 tablet 5  . traZODone (DESYREL) 100 MG tablet take 1 tablet by mouth at bedtime 90 tablet 1  . Turmeric (RA TURMERIC) 500 MG CAPS Take 1 tablet by mouth. Reported on 08/19/2015    . zolpidem (AMBIEN) 10 MG tablet take 1 tablet by mouth at bedtime if needed for sleep 30 tablet 5  . fluticasone (FLONASE) 50 MCG/ACT nasal spray Place 2 sprays into both nostrils daily. 16 g 11  . Olopatadine HCl (PATADAY) 0.2 % SOLN Apply 1 drop to eye daily. 2.5 mL 3   No current facility-administered medications for this visit.    Social History   Social  History  . Marital status: Divorced    Spouse name: N/A  . Number of children: N/A  . Years of education: N/A   Occupational History  . disability     DDD lumbar spine; 2013   Social History Main Topics  . Smoking status: Current Some Day Smoker    Packs/day: 1.00    Years: 10.00    Types: Cigarettes, Cigars  . Smokeless tobacco: Never Used     Comment: currently smokes 2 small cigar per day/  quit cigarettes 2011  . Alcohol use 0.0 oz/week     Comment: 1-2 glasses of wine daily  . Drug use: Yes    Types: Marijuana  . Sexual activity: No   Other Topics Concern  . Not on file   Social History Narrative  . No narrative on file   Family History  Problem Relation Age of Onset  . Prostate cancer Father   . Hypertension Mother   . Pulmonary fibrosis Mother   . Arthritis Mother   . Diabetes Mother   . Deep vein thrombosis Sister        Objective:    BP 128/77 (BP Location: Left Arm, Patient Position: Sitting, Cuff Size: Large)   Pulse 84   Temp 98.1 F (36.7 C) (Oral)   Resp 16   Ht 5\' 11"  (1.803 m)   Wt 211 lb 9.6 oz (96 kg)   SpO2 96%   BMI 29.51 kg/m  Physical Exam  Constitutional: He is oriented to person, place, and time. He appears well-developed and  well-nourished. No distress.  HENT:  Head: Normocephalic and atraumatic.  Right Ear: External ear normal.  Left Ear: External ear normal.  Nose: Nose normal.  Mouth/Throat: Oropharynx is clear and moist.  Eyes: Conjunctivae and EOM are normal. Pupils are equal, round, and reactive to light.  Neck: Normal range of motion. Neck supple. Carotid bruit is not present. No thyromegaly present.  Cardiovascular: Normal rate, regular rhythm, normal heart sounds and intact distal pulses.  Exam reveals no gallop and no friction rub.   No murmur heard. Pulmonary/Chest: Effort normal and breath sounds normal. He has no wheezes. He has no rales.  Abdominal: Soft. Bowel sounds are normal. He exhibits no distension and no mass. There is no tenderness. There is no rebound and no guarding.  Musculoskeletal:       Lumbar back: He exhibits decreased range of motion and pain. He exhibits no tenderness, no bony tenderness, no spasm and normal pulse.  Lymphadenopathy:    He has no cervical adenopathy.  Neurological: He is alert and oriented to person, place, and time. No cranial nerve deficit.  Skin: Skin is warm and dry. No rash noted. He is not diaphoretic.  Psychiatric: He has a normal mood and affect. His behavior is normal.  Nursing note and vitals reviewed.  Depression screen [facility] 2/9 01/06/2017 09/08/2016 05/06/2016 11/05/2015 08/19/2015  Decreased Interest 0 0 0 0 0  Down, Depressed, Hopeless 0 0 0 1 0  PHQ - 2 Score 0 0 0 1 0  Altered sleeping - - - - -  Tired, decreased energy - - - - -  Change in appetite - - - - -  Feeling bad or failure about yourself  - - - - -  Trouble concentrating - - - - -  Moving slowly or fidgety/restless - - - - -  Suicidal thoughts - - - - -  PHQ-9 Score - - - - -  Difficult  doing work/chores - - - - -   Fall Risk  01/06/2017 09/08/2016 06/18/2016 05/06/2016 11/05/2015  Falls in the past year? No No Yes No No  Number falls in past yr: - - 1 - -  Injury with Fall? - - No - -    Follow up - - Falls evaluation completed - -        Assessment & Plan:   1. Degenerative disc disease, lumbar   2. Degenerative disc disease, cervical   3. Malignant neoplasm of prostate ([place])   4. Adjustment disorder with mixed anxiety and depressed mood   5. Insomnia due to other mental disorder   6. Essential hypertension, benign   7. Allergic conjunctivitis of both eyes   8. Seasonal allergic rhinitis due to pollen    -stable; obtain labs. -continue current medications. -allergic rhinitis and conjunctivitis uncontrolled; rx for Flonase and Pataday provided.   Orders Placed This Encounter  Procedures  . PSA  . Comprehensive metabolic panel   Meds ordered this encounter  Medications  . fluticasone (FLONASE) 50 MCG/ACT nasal spray    Sig: Place 2 sprays into both nostrils daily.    Dispense:  16 g    Refill:  11  . Olopatadine HCl (PATADAY) 0.2 % SOLN    Sig: Apply 1 drop to eye daily.    Dispense:  2.5 mL    Refill:  3    Return in about 4 months (around 05/09/2017) for recheck high blood pressure, lower back pain, anxiety.   Shekina Cordell Elayne Guerin, M.D. Primary Care at [facility] [address] [place], [place]  [address] [phone] phone [phone] fax

## 2017-01-07 LAB — COMPREHENSIVE METABOLIC PANEL
ALBUMIN: 4.7 g/dL (ref 3.5–5.5)
ALT: 25 IU/L (ref 0–44)
AST: 26 IU/L (ref 0–40)
Albumin/Globulin Ratio: 1.7 (ref 1.2–2.2)
Alkaline Phosphatase: 72 IU/L (ref 39–117)
BILIRUBIN TOTAL: 0.3 mg/dL (ref 0.0–1.2)
BUN / CREAT RATIO: 19 (ref 9–20)
BUN: 22 mg/dL (ref 6–24)
CALCIUM: 9.9 mg/dL (ref 8.7–10.2)
CHLORIDE: 100 mmol/L (ref 96–106)
CO2: 25 mmol/L (ref 18–29)
Creatinine, Ser: 1.18 mg/dL (ref 0.76–1.27)
GFR, EST AFRICAN AMERICAN: 79 mL/min/{1.73_m2} (ref >59)
GFR, EST NON AFRICAN AMERICAN: 68 mL/min/{1.73_m2} (ref >59)
GLUCOSE: 99 mg/dL (ref 65–99)
Globulin, Total: 2.7 g/dL (ref 1.5–4.5)
Potassium: 5.3 mmol/L — ABNORMAL HIGH (ref 3.5–5.2)
Sodium: 140 mmol/L (ref 134–144)
TOTAL PROTEIN: 7.4 g/dL (ref 6.0–8.5)

## 2017-01-07 LAB — PSA: Prostate Specific Ag, Serum: 0.2 ng/mL (ref 0.0–4.0)

## 2017-01-15 ENCOUNTER — Telehealth: Payer: Self-pay | Admitting: Family Medicine

## 2017-01-15 DIAGNOSIS — M79671 Pain in right foot: Secondary | ICD-10-CM

## 2017-01-15 DIAGNOSIS — M79672 Pain in left foot: Principal | ICD-10-CM

## 2017-01-15 NOTE — Telephone Encounter (Signed)
Pt is looking for PSA test results from last week   Best number [phone]

## 2017-01-15 NOTE — Telephone Encounter (Signed)
Pt advised of nl results potassium .1 high  Would like a referral to podiatry for flat feet and spots on skin on feet

## 2017-01-16 NOTE — Telephone Encounter (Signed)
Referral placed to podiatry.

## 2017-01-29 ENCOUNTER — Encounter (HOSPITAL_COMMUNITY): Payer: Self-pay | Admitting: Nurse Practitioner

## 2017-01-29 ENCOUNTER — Emergency Department (HOSPITAL_COMMUNITY)
Admission: EM | Admit: 2017-01-29 | Discharge: 2017-01-29 | Disposition: A | Discharge status: Home / Self Care | Payer: Medicare HMO | Attending: Emergency Medicine | Admitting: Emergency Medicine

## 2017-01-29 DIAGNOSIS — L301 Dyshidrosis [pompholyx]: Secondary | ICD-10-CM | POA: Diagnosis not present

## 2017-01-29 DIAGNOSIS — I10 Essential (primary) hypertension: Secondary | ICD-10-CM | POA: Insufficient documentation

## 2017-01-29 DIAGNOSIS — F17210 Nicotine dependence, cigarettes, uncomplicated: Secondary | ICD-10-CM | POA: Diagnosis not present

## 2017-01-29 DIAGNOSIS — Z79899 Other long term (current) drug therapy: Secondary | ICD-10-CM | POA: Insufficient documentation

## 2017-01-29 DIAGNOSIS — L609 Nail disorder, unspecified: Secondary | ICD-10-CM | POA: Diagnosis not present

## 2017-01-29 DIAGNOSIS — R69 Illness, unspecified: Secondary | ICD-10-CM | POA: Diagnosis not present

## 2017-01-29 DIAGNOSIS — R21 Rash and other nonspecific skin eruption: Secondary | ICD-10-CM | POA: Diagnosis present

## 2017-01-29 MED ORDER — TRIAMCINOLONE ACETONIDE 0.5 % EX OINT: 1.0000 "application " | TOPICAL_OINTMENT | Freq: Two times a day (BID) | CUTANEOUS | 0 refills | Status: DC

## 2017-01-29 NOTE — ED Triage Notes (Signed)
Pt presents with c/o darkened discoloration of skin to bilateral feet. He first noticed the problem about 1 month ago after he received a steroid injection for back pain. He denies any itching or pain. At times the discolored spots form fluid filled sores that will burst with pressure. He has been applying various over the counter soaps, creams, antifungal sprays with no improvement

## 2017-01-29 NOTE — Discharge Instructions (Signed)
While the instructions attached are for hand dermatitis it is the same condition as your foot eczema.    While in the ED your blood pressure was high.  Please follow up with your primary care doctor or the wellness clinic for repeat evaluation as you may need medication.  High blood pressure can cause long term, potentially serious, damage if left untreated.

## 2017-01-29 NOTE — ED Provider Notes (Signed)
[place] DEPT Provider Note   CSN: [id number] Arrival date & time: 01/29/17  1102  By signing my name below, I, Justin Dunn, attest that this documentation has been prepared under the direction and in the presence of Justin Dunn, [place]. Electronically Signed: Dora Dunn, Scribe. 01/29/2017. 12:06 PM.  History   Chief Complaint Chief Complaint  Patient presents with  . Skin Problem   The history is provided by the patient. No language interpreter was used.    HPI Comments: Justin Dunn is a 56 y.o. male with PMHx including HTN and prostate cancer who presents to the Emergency Department complaining of a constant, gradually worsening, discolored rash to his bilateral feet for about one month. He reports some associated small fluid filled blisters on his feet that will rupture a thick clear material then scale and itch.  Patient states he started wearing a new pair of shoes about one month ago and his symptoms developed shortly thereafter. He received a steroid injection in his back for DDD a few weeks ago and notes his symptoms have worsened since then. He has tried multiple OTC foot creams and sprays with no improvement of his symptoms. No h/o eczema. He denies joint swelling, numbness/tingling, or any other associated symptoms.  Past Medical History:  Diagnosis Date  . Anxiety   . At risk for sleep apnea    STOP-BANG= 4   SENT TO PCP 04-02-2014  . Chronic low back pain   . Depression   . ED (erectile dysfunction)   . History of colon polyps   . History of gastric ulcer    as child  . Hypertension   . Hypogonadism male   . Incomplete bladder emptying   . Lumbar spine scoliosis   . Premature ejaculation   . Prostate cancer [facility])    Dr Justin Dunn ( oncologist) and Dr Justin Dunn [facility])   . Retention of urine, unspecified   . Sleep apnea    states never told to wear CPAP  . Wears glasses     Patient Active Problem List   Diagnosis Date Noted  .  Degenerative disc disease, lumbar 06/01/2016  . Degenerative disc disease, cervical 06/01/2016  . Insomnia due to other mental disorder 06/01/2016  . Adjustment disorder with mixed anxiety and depressed mood 06/01/2016  . Essential (primary) hypertension 02/07/2015  . Malignant neoplasm of prostate ([place]) 01/09/2014    Past Surgical History:  Procedure Laterality Date  . APPENDECTOMY  age 4  . COLONOSCOPY W/ POLYPECTOMY  july 2015  . LUMBAR FUSION  04/ 2012   and rod  . PROSTATE BIOPSY    . RADIOACTIVE SEED IMPLANT N/A 04/06/2014   Procedure: RADIOACTIVE SEED IMPLANT;  Surgeon: Justin Jabs, MD;  Location: [facility];  Service: Urology;  Laterality: N/A;  dr portable        Home Medications    Prior to Admission medications   Medication Sig Start Date End Date Taking? Authorizing Provider  b complex vitamins tablet Take 1 tablet by mouth daily.    [provider]  Calcium-Cholecalciferol (VITA-CALCIUM PO) Take by mouth.    [provider]  Cholecalciferol (VITAMIN D3) 1000 units CAPS Take by mouth.    [provider]  enalapril (VASOTEC) 20 MG tablet Take 1 tablet (20 mg total) by mouth daily. 09/08/16   Wardell Honour, MD  fluticasone Asencion Islam) 50 MCG/ACT nasal spray Place 2 sprays into both nostrils daily. 01/06/17   Wardell Honour,  MD  gabapentin (NEURONTIN) 300 MG capsule Take 3 capsules (900 mg total) by mouth 3 (three) times daily. 09/08/16   Wardell Honour, MD  meloxicam (MOBIC) 15 MG tablet Take 15 mg by mouth daily.    [provider]  Multiple Vitamins-Minerals-FA (VITATAB MV PO) Take by mouth.    [provider]  Olopatadine HCl (PATADAY) 0.2 % SOLN Apply 1 drop to eye daily. 01/06/17   Wardell Honour, MD  Phenazopyridine HCl (AZO URINARY PAIN PO) Take by mouth.    [provider]  saw palmetto 160 MG capsule Take 160 mg by mouth 2 (two) times daily.    [provider]  sertraline (ZOLOFT) 100 MG  tablet Take 2 tablets (200 mg total) by mouth daily. 09/08/16   Wardell Honour, MD  tiZANidine (ZANAFLEX) 4 MG capsule Take 1 capsule (4 mg total) by mouth 3 (three) times daily as needed for muscle spasms. 05/06/16   Wardell Honour, MD  traMADol (ULTRAM) 50 MG tablet Take 1 tablet (50 mg total) by mouth every 12 (twelve) hours as needed. 09/08/16   Wardell Honour, MD  traZODone (DESYREL) 100 MG tablet take 1 tablet by mouth at bedtime 12/14/16   Wardell Honour, MD  triamcinolone ointment (KENALOG) 0.5 % Apply 1 application topically 2 (two) times daily. 01/29/17   Lorin Glass, PA-C  Turmeric (RA TURMERIC) 500 MG CAPS Take 1 tablet by mouth. Reported on 08/19/2015    [provider]  zolpidem (AMBIEN) 10 MG tablet take 1 tablet by mouth at bedtime if needed for sleep 09/08/16   Wardell Honour, MD    Family History Family History  Problem Relation Age of Onset  . Prostate cancer Father   . Hypertension Mother   . Pulmonary fibrosis Mother   . Arthritis Mother   . Diabetes Mother   . Deep vein thrombosis Sister     Social History Social History  Substance Use Topics  . Smoking status: Current Some Day Smoker    Packs/day: 1.00    Years: 10.00    Types: Cigarettes, Cigars  . Smokeless tobacco: Never Used     Comment: currently smokes 2 small cigar per day/  quit cigarettes 2011  . Alcohol use 0.0 oz/week     Comment: 1-2 glasses of wine daily     Allergies   Patient has no known allergies.   Review of Systems Review of Systems  Musculoskeletal: Negative for joint swelling.  Skin: Positive for color change, rash and wound (blisters).  Neurological: Negative for numbness.   Physical Exam Updated Vital Signs BP (!) 146/93 (BP Location: Left Arm)   Pulse 77   Temp 98.4 F (36.9 C) (Oral)   Resp 18   SpO2 99%   Physical Exam  Constitutional: He is oriented to person, place, and time. He appears well-developed and well-nourished. No distress.    Pulmonary/Chest: Effort normal. No respiratory distress.  Musculoskeletal: Normal range of motion.  Neurological: He is alert and oriented to person, place, and time.  Skin: Skin is warm and dry. Rash noted. Rash is vesicular.  There is a diffuse vesicular (1-2 mm) rash over the medial aspects of both feet.  There is associated scaling and areas of increased pigmentation.  No puss or obvious infection noted.  There are calluses to the lateral aspect of each great toe.  His nails appear slightly thickened with out pitting.    Psychiatric: He has a normal mood  and affect. His behavior is normal.  Nursing note and vitals reviewed.  ED Treatments / Results  Labs (all labs ordered are listed, but only abnormal results are displayed) Labs Reviewed - No data to display  EKG  EKG Interpretation None       Radiology No results found.  Procedures Procedures (including critical care time)  DIAGNOSTIC STUDIES: Oxygen Saturation is 99% on RA, normal by my interpretation.    COORDINATION OF CARE: 12:06 PM Discussed treatment plan with pt at bedside and pt agreed to plan.  Medications Ordered in ED Medications - No data to display   Initial Impression / Assessment and Plan / ED Course  I have reviewed the triage vital signs and the nursing notes.  Pertinent labs & imaging results that were available during my care of the patient were reviewed by me and considered in my medical decision making (see chart for details).    Milas Gain presents with a rash on the medial aspect of both feet consistent with dyshidrotic eczema.  He will be given a rx for topical steroids and discharged home with podiatry follow up due to his concern over his toe nails and skin thickening consistent with calluses.    At this time there does not appear to be any evidence of an acute emergency medical condition and the patient appears stable for discharge with appropriate outpatient follow up.Diagnosis  was discussed with patient who verbalizes understanding and is agreeable to discharge. Pt case discussed with Dr. Leonette Monarch who agrees with my plan and saw the patient.     Final Clinical Impressions(s) / ED Diagnoses   Final diagnoses:  Dyshidrotic eczema  Nail abnormalities  Dyshidrotic foot dermatitis    New Prescriptions Discharge Medication List as of 01/29/2017 12:19 PM    START taking these medications   Details  triamcinolone ointment (KENALOG) 0.5 % Apply 1 application topically 2 (two) times daily., Starting Fri 01/29/2017, Print       I personally performed the services described in this documentation, which was scribed in my presence. The recorded information has been reviewed and is accurate.    Lorin Glass, PA-C 01/29/17 1708    Fatima Blank, MD 02/02/17 [phone]

## 2017-03-04 ENCOUNTER — Ambulatory Visit (INDEPENDENT_AMBULATORY_CARE_PROVIDER_SITE_OTHER): Payer: Medicare HMO | Admitting: Podiatry

## 2017-03-04 ENCOUNTER — Encounter: Payer: Self-pay | Admitting: Podiatry

## 2017-03-04 ENCOUNTER — Ambulatory Visit (INDEPENDENT_AMBULATORY_CARE_PROVIDER_SITE_OTHER): Payer: Medicare HMO

## 2017-03-04 VITALS — BP 131/85 | HR 78 | Resp 16 | Ht 71.0 in | Wt 210.0 lb

## 2017-03-04 DIAGNOSIS — L84 Corns and callosities: Secondary | ICD-10-CM

## 2017-03-04 DIAGNOSIS — M2142 Flat foot [pes planus] (acquired), left foot: Secondary | ICD-10-CM

## 2017-03-04 DIAGNOSIS — L309 Dermatitis, unspecified: Secondary | ICD-10-CM

## 2017-03-04 DIAGNOSIS — M2141 Flat foot [pes planus] (acquired), right foot: Secondary | ICD-10-CM | POA: Diagnosis not present

## 2017-03-04 DIAGNOSIS — M79672 Pain in left foot: Secondary | ICD-10-CM

## 2017-03-04 DIAGNOSIS — M79671 Pain in right foot: Secondary | ICD-10-CM

## 2017-03-04 NOTE — Progress Notes (Signed)
   Subjective:    Patient ID: Justin Dunn, male    DOB: [birth year], 58 y.o.   MRN: [id number]  HPI Chief Complaint  Patient presents with  . Callouses    Bilateral; great toes-medial side; x6-8 months  . Skin Condition    Bilateral; plantar; pt stated, "Has skin discoloration and darkening of skin"      Review of Systems  Skin: Positive for color change and rash.  All other systems reviewed and are negative.      Objective:   Physical Exam        Assessment & Plan:

## 2017-03-05 NOTE — Progress Notes (Signed)
Subjective:    Patient ID: Justin Dunn, male   DOB: 56 y.o.   MRN: [id number]   HPI patient presents with very dry skin and also presents with severe keratotic lesions on the hallux bilateral that are very painful when pressed with a history of severe back pain with surgery    Review of Systems  All other systems reviewed and are negative.       Objective:  Physical Exam  Constitutional: He is oriented to person, place, and time.  Cardiovascular: Intact distal pulses.   Musculoskeletal: Normal range of motion.  Neurological: He is alert and oriented to person, place, and time.  Skin: Skin is warm and dry.  Nursing note and vitals reviewed.  neurovascular status found to be intact muscle strength was mildly reduced with range of motion subtalar midtarsal joint slightly reduced. Patient's noted to have significant flatfoot deformity dry skin in general and on the medial side of the hallux left over right severe keratotic lesion that are painful when pressed with deviation of the hallux. I did note mild equinus and patient's found have good digital perfusion and is well oriented 3     Assessment:   Significant structural malalignment with long-term history of back issues which may cause changes in gait with severe keratotic tissue formation     Plan:   H&P conditions reviewed and deep debridement of lesions accomplished with padding to take pressure off the area. Advised on using skin cream for dry skin and supportive insoles and will be seen back as needed  X-rays indicate malalignment of the hallux bilateral creating pressure but not anything I would recommend surgical intervention for

## 2017-03-08 ENCOUNTER — Other Ambulatory Visit: Payer: Self-pay | Admitting: Family Medicine

## 2017-03-08 NOTE — Telephone Encounter (Signed)
Please call in refill of Ambien as approved.

## 2017-03-09 NOTE — Telephone Encounter (Signed)
Faxed prescription

## 2017-03-26 DIAGNOSIS — Z8546 Personal history of malignant neoplasm of prostate: Secondary | ICD-10-CM | POA: Diagnosis not present

## 2017-05-12 ENCOUNTER — Encounter: Payer: Self-pay | Admitting: Family Medicine

## 2017-05-12 ENCOUNTER — Ambulatory Visit (INDEPENDENT_AMBULATORY_CARE_PROVIDER_SITE_OTHER): Payer: Medicare HMO | Admitting: Family Medicine

## 2017-05-12 VITALS — BP 128/80 | HR 76 | Temp 98.0°F | Resp 16 | Ht 70.87 in | Wt 218.0 lb

## 2017-05-12 DIAGNOSIS — F4323 Adjustment disorder with mixed anxiety and depressed mood: Secondary | ICD-10-CM

## 2017-05-12 DIAGNOSIS — C61 Malignant neoplasm of prostate: Secondary | ICD-10-CM

## 2017-05-12 DIAGNOSIS — I10 Essential (primary) hypertension: Secondary | ICD-10-CM

## 2017-05-12 DIAGNOSIS — Z23 Encounter for immunization: Secondary | ICD-10-CM

## 2017-05-12 DIAGNOSIS — M5136 Other intervertebral disc degeneration, lumbar region: Secondary | ICD-10-CM | POA: Diagnosis not present

## 2017-05-12 DIAGNOSIS — F5105 Insomnia due to other mental disorder: Secondary | ICD-10-CM | POA: Diagnosis not present

## 2017-05-12 DIAGNOSIS — M5030 Other cervical disc degeneration, unspecified cervical region: Secondary | ICD-10-CM

## 2017-05-12 DIAGNOSIS — R69 Illness, unspecified: Secondary | ICD-10-CM | POA: Diagnosis not present

## 2017-05-12 DIAGNOSIS — F99 Mental disorder, not otherwise specified: Secondary | ICD-10-CM | POA: Diagnosis not present

## 2017-05-12 LAB — COMPREHENSIVE METABOLIC PANEL
A/G RATIO: 2 (ref 1.2–2.2)
ALBUMIN: 4.6 g/dL (ref 3.5–5.5)
ALT: 22 IU/L (ref 0–44)
AST: 28 IU/L (ref 0–40)
Alkaline Phosphatase: 74 IU/L (ref 39–117)
BUN/Creatinine Ratio: 16 (ref 9–20)
BUN: 18 mg/dL (ref 6–24)
Bilirubin Total: 0.3 mg/dL (ref 0.0–1.2)
CALCIUM: 9.9 mg/dL (ref 8.7–10.2)
CO2: 24 mmol/L (ref 20–29)
Chloride: 99 mmol/L (ref 96–106)
Creatinine, Ser: 1.13 mg/dL (ref 0.76–1.27)
GFR, EST AFRICAN AMERICAN: 82 mL/min/{1.73_m2} (ref >59)
GFR, EST NON AFRICAN AMERICAN: 71 mL/min/{1.73_m2} (ref >59)
Globulin, Total: 2.3 g/dL (ref 1.5–4.5)
Glucose: 83 mg/dL (ref 65–99)
Potassium: 4.8 mmol/L (ref 3.5–5.2)
Sodium: 138 mmol/L (ref 134–144)
TOTAL PROTEIN: 6.9 g/dL (ref 6.0–8.5)

## 2017-05-12 MED ORDER — TIZANIDINE HCL 4 MG PO CAPS: 4.0000 mg | ORAL_CAPSULE | Freq: Three times a day (TID) | ORAL | 5 refills | Status: DC | PRN

## 2017-05-12 MED ORDER — TRAMADOL HCL 50 MG PO TABS: 50.0000 mg | ORAL_TABLET | Freq: Two times a day (BID) | ORAL | 5 refills | Status: DC | PRN

## 2017-05-12 MED ORDER — MELOXICAM 15 MG PO TABS: 15.0000 mg | ORAL_TABLET | Freq: Every day | ORAL | 1 refills | Status: DC | PRN

## 2017-05-12 NOTE — Progress Notes (Signed)
Subjective:    Patient ID: Justin Dunn, male    DOB: [birth year], 56 y.o.   MRN: [id number]  05/12/2017  Hypertension (4 month follow-up); Anxiety; and Back Pain   HPI This 56 y.o. male presents for evaluation of hypertension, anxiety/depression, insomnia, and DDD lumbar and cervical spines.  Management changes at last visit included rx for Flonase and Pataday for allergic rhinitis with conjuncitivitis.  CMET and PSA obtained at last visit; all negative except for K 5.3.    S/p podiatry consultation in 02/2017.  Skin calluses trimmed and treated; s/p xray; no surgical intervention warranted.  Feesl better; after took PSA and Dr. Rosana Hoes looked at it and recommends repeat in six months.  Feels like not emptying bladder fully; wears depends just in case suffers with leakage.  Erections are not an issue; no intimacy with anyone yet erections are returning.  Good news.Tries not to worry about concern of recurrence.  Will suffer with lower back pressure.  With walking after prolonged sitting, feels positioning not correct.  Lower back will start to spasm and tightening.  Feels inflamed.  When does stuff around the house and care for son around the house, bathing son, cook for son.  Now lumbar spine is fused.  Requesting higher pain medication.    No previous pain specialist.  S/p recent visit with orthopedist early this year with Dr. Eugenio Hoes who works with [facility].  S/p lumbar spine injections.  Wanted to suggest "burning the nerve".  S/p steroid injections x 2.  Decided not to return for repeat injection.  Mows yard; very active.  May need a lot of bending.  Gabapentin 3 capsules tid.   Going to aquatic center three times so far.  Had tournament recently so pulled back.  Plans to go silver sneakers.  Got flippers.  Kicking with flippers.  Stopped Zoloft for three months.  Admits to ongoing anxiety at times.    BP Readings from Last 3 Encounters:  05/12/17 128/80  03/04/17 131/85  01/29/17 (!)  146/93   Wt Readings from Last 3 Encounters:  05/12/17 218 lb (98.9 kg)  03/04/17 210 lb (95.3 kg)  01/06/17 211 lb 9.6 oz (96 kg)   Immunization History  Administered Date(s) Administered  . Influenza,inj,Quad PF,6+ Mos 05/12/2017  . PPD Test 08/19/2015, 09/08/2016    Review of Systems  Constitutional: Negative for activity change, appetite change, chills, diaphoresis, fatigue and fever.  Respiratory: Negative for cough and shortness of breath.   Cardiovascular: Negative for chest pain, palpitations and leg swelling.  Gastrointestinal: Negative for abdominal pain, diarrhea, nausea and vomiting.  Endocrine: Negative for cold intolerance, heat intolerance, polydipsia, polyphagia and polyuria.  Musculoskeletal: Positive for back pain.  Skin: Negative for color change, rash and wound.  Neurological: Negative for dizziness, tremors, seizures, syncope, facial asymmetry, speech difficulty, weakness, light-headedness, numbness and headaches.  Psychiatric/Behavioral: Positive for sleep disturbance. Negative for dysphoric mood. The patient is nervous/anxious.     Past Medical History:  Diagnosis Date  . Anxiety   . At risk for sleep apnea    STOP-BANG= 4   SENT TO PCP 04-02-2014  . Chronic low back pain   . Depression   . ED (erectile dysfunction)   . History of colon polyps   . History of gastric ulcer    as child  . Hypertension   . Hypogonadism male   . Incomplete bladder emptying   . Lumbar spine scoliosis   . Premature ejaculation   . Prostate  cancer [facility])    Dr Tammi Klippel ( oncologist) and Dr Loel Lofty [facility])   . Retention of urine, unspecified   . Sleep apnea    states never told to wear CPAP  . Wears glasses    Past Surgical History:  Procedure Laterality Date  . APPENDECTOMY  age 46  . COLONOSCOPY W/ POLYPECTOMY  july 2015  . LUMBAR FUSION  04/ 2012   and rod  . PROSTATE BIOPSY    . RADIOACTIVE SEED IMPLANT N/A 04/06/2014   Procedure: RADIOACTIVE SEED  IMPLANT;  Surgeon: [name], MD;  [facility];  Service: Urology;  Laterality: N/A;  dr portable    No Known Allergies  Social History   Social History  . Marital status: Divorced    Spouse name: N/A  . Number of children: N/A  . Years of education: N/A   Occupational History  . disability     DDD lumbar spine; 2013   Social History Main Topics  . Smoking status: Current Some Day Smoker    Packs/day: 1.00    Years: 10.00    Types: Cigarettes, Cigars  . Smokeless tobacco: Never Used     Comment: currently smokes 2 small cigar per day/  quit cigarettes 2011  . Alcohol use 0.0 oz/week     Comment: 1-2 glasses of wine daily  . Drug use: Yes    Types: Marijuana  . Sexual activity: No   Other Topics Concern  . Not on file   Social History Narrative  . No narrative on file   Family History  Problem Relation Age of Onset  . Prostate cancer Father   . Hypertension Mother   . Pulmonary fibrosis Mother   . Arthritis Mother   . Diabetes Mother   . Deep vein thrombosis Sister        Objective:    BP 128/80   Pulse 76   Temp 98 F (36.7 C) (Oral)   Resp 16   Ht 5' 10.87" (1.8 m)   Wt 218 lb (98.9 kg)   SpO2 97%   BMI 30.52 kg/m  Physical Exam  Constitutional: He is oriented to person, place, and time. He appears well-developed and well-nourished. No distress.  HENT:  Head: Normocephalic and atraumatic.  Right Ear: External ear normal.  Left Ear: External ear normal.  Nose: Nose normal.  Mouth/Throat: Oropharynx is clear and moist.  Eyes: Pupils are equal, round, and reactive to light. Conjunctivae and EOM are normal.  Neck: Normal range of motion. Neck supple. Carotid bruit is not present. No thyromegaly present.  Cardiovascular: Normal rate, regular rhythm, normal heart sounds and intact distal pulses.  Exam reveals no gallop and no friction rub.   No murmur heard. Pulmonary/Chest: Effort normal and breath sounds normal. He has  no wheezes. He has no rales.  Abdominal: Soft. Bowel sounds are normal. He exhibits no distension and no mass. There is no tenderness. There is no rebound and no guarding.  Musculoskeletal:       Lumbar back: He exhibits decreased range of motion, pain and spasm. He exhibits no tenderness and normal pulse.  Lymphadenopathy:    He has no cervical adenopathy.  Neurological: He is alert and oriented to person, place, and time. No cranial nerve deficit.  Skin: Skin is warm and dry. No rash noted. He is not diaphoretic.  Psychiatric: He has a normal mood and affect. His behavior is normal.  Nursing note and vitals reviewed.  No results found. Depression screen [facility] 2/9 05/12/2017 01/06/2017 09/08/2016 05/06/2016 11/05/2015  Decreased Interest 0 0 0 0 0  Down, Depressed, Hopeless 0 0 0 0 1  PHQ - 2 Score 0 0 0 0 1  Altered sleeping - - - - -  Tired, decreased energy - - - - -  Change in appetite - - - - -  Feeling bad or failure about yourself  - - - - -  Trouble concentrating - - - - -  Moving slowly or fidgety/restless - - - - -  Suicidal thoughts - - - - -  PHQ-9 Score - - - - -  Difficult doing work/chores - - - - -   Fall Risk  05/12/2017 01/06/2017 09/08/2016 06/18/2016 05/06/2016  Falls in the past year? No No No Yes No  Number falls in past yr: - - - 1 -  Injury with Fall? - - - No -  Follow up - - - Falls evaluation completed -        Assessment & Plan:   1. Essential (primary) hypertension   2. Degenerative disc disease, cervical   3. Degenerative disc disease, lumbar   4. Malignant neoplasm of prostate ([place])   5. Adjustment disorder with mixed anxiety and depressed mood   6. Insomnia due to other mental disorder   7. Need for prophylactic vaccination and inoculation against influenza    -worsening lower back pain on days when must be active with specialty needs adult son; rx for Meloxicam and Zanaflex provided; continue Tramadol and advised that can take two Tramadol every twelve  hours as needed; did not prescribe opiate.  If pain not controlled with this regimen and Gabapentin, recommend referral to pain management. -anxiety persistent; pt discontinued Sertraline six months ago; recommend restarting 50mg  daily for one month and then increase to 100mg  daily.   Orders Placed This Encounter  Procedures  . Flu Vaccine QUAD 36+ mos IM  . Comprehensive metabolic panel   Meds ordered this encounter  Medications  . tiZANidine (ZANAFLEX) 4 MG capsule    Sig: Take 1 capsule (4 mg total) by mouth 3 (three) times daily as needed for muscle spasms.    Dispense:  90 capsule    Refill:  5  . meloxicam (MOBIC) 15 MG tablet    Sig: Take 1 tablet (15 mg total) by mouth daily as needed for pain.    Dispense:  90 tablet    Refill:  1  . traMADol (ULTRAM) 50 MG tablet    Sig: Take 1-2 tablets (50-100 mg total) by mouth every 12 (twelve) hours as needed.    Dispense:  60 tablet    Refill:  5    Return in about 4 months (around 09/11/2017) for recheck low back pain, high blood pressure.   Osker Ayoub Elayne Guerin, M.D. Primary Care at [facility] previously [facility] [address] [place], [place]  [address] [phone] phone [phone] fax

## 2017-05-12 NOTE — Patient Instructions (Addendum)
Restart SERTRALINE 100MG  ---- 1/2 tablet daily x 1 month and then increase to 1 tablet daily for anxiety/nervousness. Recommend TIZANIDINE 4mg  ---- 1 tablet three times daily for back stiffness, spasms, tightness. Recommend Meloxicam 15mg  --- 1 tablet daily as needed for back pain and inflammation. Take Tramadol 50mg  --- 1 tablet three times daily as needed for pain. Continue Gabapentin 300mg  --- 3 capsules three times daily.   IF you received an x-ray today, you will receive an invoice from [facility]. Please contact [facility] at [phone] with questions or concerns regarding your invoice.   IF you received labwork today, you will receive an invoice from [facility]. Please contact [facility] at [phone] with questions or concerns regarding your invoice.   Our billing staff will not be able to assist you with questions regarding bills from these companies.  You will be contacted with the lab results as soon as they are available. The fastest way to get your results is to activate your My Chart account. Instructions are located on the last page of this paperwork. If you have not heard from [place] regarding the results in 2 weeks, please contact this office.      Spondylolisthesis Rehab Ask your health care provider which exercises are safe for you. Do exercises exactly as told by your health care provider and adjust them as directed. It is normal to feel mild stretching, pulling, tightness, or discomfort as you do these exercises, but you should stop right away if you feel sudden pain or your pain gets worse. Do not begin these exercises until told by your health care provider. Stretching and range of motion exercises These exercises warm up your muscles and joints and improve the movement and flexibility of your hips and your back. These exercises may also help to relieve pain, numbness, and tingling. Exercise A: Single knee to chest  1. Lie on your back on a firm  surface with both legs straight. 2. Bend one of your knees. Use your hands to move your knee up toward your chest until you feel a gentle stretch in your lower back and buttock. ? Hold your leg in this position by holding onto the front of your knee. ? Keep your other leg as straight as possible. 3. Hold for __________ seconds. 4. Slowly return to the starting position. 5. Repeat this exercise with your other leg. Repeat __________ times. Complete this exercise __________ times a day. Exercise B: Double knee to chest  1. Lie on your back on a firm surface with both legs straight. 2. Bend one of your knees and move it toward your chest until you feel a gentle stretch in your lower back and buttock. 3. Tense your abdominal muscles and repeat the previous step with your other leg. 4. Hold both of your legs in this position by holding onto the backs of your thighs or the fronts of your knees. 5. Hold for __________ seconds. 6. Tense your abdominal muscles and slowly move your legs back to the floor, one leg at a time. Repeat __________ times. Complete this exercise __________ times a day. Strengthening exercises These exercises build strength and endurance in your back. Endurance is the ability to use your muscles for a long time, even after they get tired. Exercise C: Pelvic tilt 1. Lie on your back on a firm bed or the floor. Bend your knees and keep your feet flat. 2. Tense your abdominal muscles. Tip your pelvis up toward the ceiling and flatten your lower  back into the floor. ? To help with this exercise, you may place a small towel under your lower back and try to push your back into the towel. 3. Hold for __________ seconds. 4. Let your muscles relax completely before you repeat this exercise. Repeat __________ times. Complete this exercise __________ times a day. Exercise D: Abdominal crunch  1. Lie on your back on a firm surface. Bend your knees and keep your feet flat. Cross your  arms over your chest. 2. Tuck your chin down toward your chest, without bending your neck. 3. Use your abdominal muscles to lift your upper body off of the ground, straight up into the air. ? Try to lift yourself until your shoulder blades are off the ground. You may need to work up to this. ? Keep your lower back on the ground while you crunch upward. ? Do not hold your breath. 4. Slowly lower yourself down. Keep your abdominal muscles tense until you are back to the starting position. Repeat __________ times. Complete this exercise __________ times a day. Exercise E: Alternating arm and leg raises  1. Get on your hands and knees on a firm surface. If you are on a hard floor, you may want to use padding to cushion your knees, such as an exercise mat. 2. Line up your arms and legs. Your hands should be below your shoulders, and your knees should be below your hips. 3. Lift your left leg behind you. At the same time, raise your right arm and straighten it in front of you. ? Do not lift your leg higher than your hip. ? Do not lift your arm higher than your shoulder. ? Keep your abdominal and back muscles tight. ? Keep your hips facing the ground. ? Do not arch your back. ? Keep your balance carefully, and do not hold your breath. 4. Hold for __________ seconds. 5. Slowly return to the starting position and repeat with your right leg and your left arm. Repeat __________ times. Complete this exercise __________ times a day. Posture and body mechanics  Body mechanics refers to the movements and positions of your body while you do your daily activities. Posture is part of body mechanics. Good posture and healthy body mechanics can help to relieve stress in your body's tissues and joints. Good posture means that your spine is in its natural S-curve position (your spine is neutral), your shoulders are pulled back slightly, and your head is not tipped forward. The following are general guidelines for  applying improved posture and body mechanics to your everyday activities. Standing   When standing, keep your spine neutral and your feet about hip-width apart. Keep a slight bend in your knees. Your ears, shoulders, and hips should line up.  When you do a task in which you stand in one place for a long time, place one foot up on a stable object that is 2-4 inches (5-10 cm) high, such as a footstool. This helps keep your spine neutral. Sitting   When sitting, keep your spine neutral and keep your feet flat on the floor. Use a footrest, if necessary, and keep your thighs parallel to the floor. Avoid rounding your shoulders, and avoid tilting your head forward.  When working at a desk or a computer, keep your desk at a height where your hands are slightly lower than your elbows. Slide your chair under your desk so you are close enough to maintain good posture.  When working at a computer, place  your monitor at a height where you are looking straight ahead and you do not have to tilt your head forward or downward to look at the screen. Resting  When lying down and resting, avoid positions that are most painful for you.  If you have pain with activities such as sitting, bending, stooping, or squatting (flexion-based activities), lie in a position in which your body does not bend very much. For example, avoid curling up on your side with your arms and knees near your chest (fetal position).  If you have pain with activities such as standing for a long time or reaching with your arms (extension-based activities), lie with your spine in a neutral position and bend your knees slightly. Try the following positions: ? Lying on your side with a pillow between your knees. ? Lying on your back with a pillow under your knees.  Lifting   When lifting objects, keep your feet at least shoulder-width apart and tighten your abdominal muscles.  Bend your knees and hips and keep your spine neutral. It is  important to lift using the strength of your legs, not your back. Do not lock your knees straight out.  Always ask for help to lift heavy or awkward objects. This information is not intended to replace advice given to you by your health care provider. Make sure you discuss any questions you have with your health care provider. Document Released: 08/17/2005 Document Revised: 04/23/2016 Document Reviewed: 05/28/2015 Elsevier Interactive Patient Education  [employer].

## 2017-05-15 ENCOUNTER — Encounter: Payer: Self-pay | Admitting: Family Medicine

## 2017-08-17 ENCOUNTER — Telehealth: Payer: Self-pay | Admitting: Family Medicine

## 2017-08-17 NOTE — Telephone Encounter (Signed)
Called pt to reschedule appt from 09/13/17. Dr. Tamala Julian will not be in the office that day. When pt. Calls back please reschedule pt for any day other than 09/13/17 or 09/22/17.  Thanks!

## 2017-09-01 NOTE — Telephone Encounter (Signed)
Attempted   To  Speak  With  Patient regarding a  Call   From  A  Nurse  . CRM  [phone]    Reviewed    And  Pt  Needs  To  Schedule  An appt  For  Annual   Wellness  Visit  After 1/9 /2018  Message  Left  On  Patients  VM    To  Call back and  Schedule  The  Exam

## 2017-09-01 NOTE — Telephone Encounter (Addendum)
Patient calling stating he was told to see nurse, appt with Dr Tamala Julian is for 09/20/17. Please advise

## 2017-09-09 ENCOUNTER — Ambulatory Visit (INDEPENDENT_AMBULATORY_CARE_PROVIDER_SITE_OTHER): Payer: Medicare HMO

## 2017-09-09 ENCOUNTER — Telehealth: Payer: Self-pay

## 2017-09-09 VITALS — BP 132/82 | HR 92 | Temp 98.3°F | Ht 71.0 in | Wt 223.4 lb

## 2017-09-09 DIAGNOSIS — I10 Essential (primary) hypertension: Secondary | ICD-10-CM | POA: Diagnosis not present

## 2017-09-09 DIAGNOSIS — Z594 Lack of adequate food and safe drinking water: Secondary | ICD-10-CM | POA: Diagnosis not present

## 2017-09-09 DIAGNOSIS — Z0000 Encounter for general adult medical examination without abnormal findings: Secondary | ICD-10-CM | POA: Diagnosis not present

## 2017-09-09 DIAGNOSIS — Z598 Other problems related to housing and economic circumstances: Secondary | ICD-10-CM

## 2017-09-09 DIAGNOSIS — T730XXA Starvation, initial encounter: Secondary | ICD-10-CM

## 2017-09-09 DIAGNOSIS — Z5948 Other specified lack of adequate food: Secondary | ICD-10-CM

## 2017-09-09 DIAGNOSIS — Z599 Problem related to housing and economic circumstances, unspecified: Secondary | ICD-10-CM

## 2017-09-09 NOTE — Patient Instructions (Addendum)
Justin Dunn , Thank you for taking time to come for your Medicare Wellness Visit. I appreciate your ongoing commitment to your health goals. Please review the following plan we discussed and let me know if I can assist you in the future.   Screening recommendations/referrals: Colonoscopy: up to date, next due 08/31/2021 Recommended yearly ophthalmology/optometry visit for glaucoma screening and checkup Recommended yearly dental visit for hygiene and checkup  Vaccinations: Influenza vaccine: up to date Pneumococcal vaccine: start at age 85 Tdap vaccine: declined due to insurance Shingles vaccine: Check with your pharmacy about receiving the Shingrix vaccine    Advanced directives: Advance directive discussed with you today. I have provided a copy for you to complete at home and have notarized. Once this is complete please bring a copy in to our office so we can scan it into your chart.   Conditions/risks identified: Try to exercise more and develop a good core strength. Try to eat a more healthier diet.   Next appointment: 09/20/17 @ 11:40 am with Dr. Tamala Julian, next Medicare Wellness visit is 09/09/2018 @ 11:20 am with [name], Male Preventive care refers to lifestyle choices and visits with your health care provider that can promote health and wellness. What does preventive care include?  A yearly physical exam. This is also called an annual well check.  Dental exams once or twice a year.  Routine eye exams. Ask your health care provider how often you should have your eyes checked.  Personal lifestyle choices, including:  Daily care of your teeth and gums.  Regular physical activity.  Eating a healthy diet.  Avoiding tobacco and drug use.  Limiting alcohol use.  Practicing safe sex.  Taking low-dose aspirin every day starting at age 47. What happens during an annual well check? The services and screenings done by your health care  provider during your annual well check will depend on your age, overall health, lifestyle risk factors, and family history of disease. Counseling  Your health care provider may ask you questions about your:  Alcohol use.  Tobacco use.  Drug use.  Emotional well-being.  Home and relationship well-being.  Sexual activity.  Eating habits.  Work and work Statistician. Screening  You may have the following tests or measurements:  Height, weight, and BMI.  Blood pressure.  Lipid and cholesterol levels. These may be checked every 5 years, or more frequently if you are over 47 years old.  Skin check.  Lung cancer screening. You may have this screening every year starting at age 2 if you have a 30-pack-year history of smoking and currently smoke or have quit within the past 15 years.  Fecal occult blood test (FOBT) of the stool. You may have this test every year starting at age 32.  Flexible sigmoidoscopy or colonoscopy. You may have a sigmoidoscopy every 5 years or a colonoscopy every 10 years starting at age 91.  Prostate cancer screening. Recommendations will vary depending on your family history and other risks.  Hepatitis C blood test.  Hepatitis B blood test.  Sexually transmitted disease (STD) testing.  Diabetes screening. This is done by checking your blood sugar (glucose) after you have not eaten for a while (fasting). You may have this done every 1-3 years. Discuss your test results, treatment options, and if necessary, the need for more tests with your health care provider. Vaccines  Your health care provider may recommend certain vaccines, such as:  Influenza vaccine. This is  recommended every year.  Tetanus, diphtheria, and acellular pertussis (Tdap, Td) vaccine. You may need a Td booster every 10 years.  Zoster vaccine. You may need this after age 72.  Pneumococcal 13-valent conjugate (PCV13) vaccine. You may need this if you have certain conditions and  have not been vaccinated.  Pneumococcal polysaccharide (PPSV23) vaccine. You may need one or two doses if you smoke cigarettes or if you have certain conditions. Talk to your health care provider about which screenings and vaccines you need and how often you need them. This information is not intended to replace advice given to you by your health care provider. Make sure you discuss any questions you have with your health care provider. Document Released: 09/13/2015 Document Revised: 05/06/2016 Document Reviewed: 06/18/2015 Elsevier Interactive Patient Education  2017 [place] Prevention in the Home Falls can cause injuries. They can happen to people of all ages. There are many things you can do to make your home safe and to help prevent falls. What can I do on the outside of my home?  Regularly fix the edges of walkways and driveways and fix any cracks.  Remove anything that might make you trip as you walk through a door, such as a raised step or threshold.  Trim any bushes or trees on the path to your home.  Use bright outdoor lighting.  Clear any walking paths of anything that might make someone trip, such as rocks or tools.  Regularly check to see if handrails are loose or broken. Make sure that both sides of any steps have handrails.  Any raised decks and porches should have guardrails on the edges.  Have any leaves, snow, or ice cleared regularly.  Use sand or salt on walking paths during winter.  Clean up any spills in your garage right away. This includes oil or grease spills. What can I do in the bathroom?  Use night lights.  Install grab bars by the toilet and in the tub and shower. Do not use towel bars as grab bars.  Use non-skid mats or decals in the tub or shower.  If you need to sit down in the shower, use a plastic, non-slip stool.  Keep the floor dry. Clean up any water that spills on the floor as soon as it happens.  Remove soap buildup in the  tub or shower regularly.  Attach bath mats securely with double-sided non-slip rug tape.  Do not have throw rugs and other things on the floor that can make you trip. What can I do in the bedroom?  Use night lights.  Make sure that you have a light by your bed that is easy to reach.  Do not use any sheets or blankets that are too big for your bed. They should not hang down onto the floor.  Have a firm chair that has side arms. You can use this for support while you get dressed.  Do not have throw rugs and other things on the floor that can make you trip. What can I do in the kitchen?  Clean up any spills right away.  Avoid walking on wet floors.  Keep items that you use a lot in easy-to-reach places.  If you need to reach something above you, use a strong step stool that has a grab bar.  Keep electrical cords out of the way.  Do not use floor polish or wax that makes floors slippery. If you must use wax, use non-skid floor wax.  Do not have throw rugs and other things on the floor that can make you trip. What can I do with my stairs?  Do not leave any items on the stairs.  Make sure that there are handrails on both sides of the stairs and use them. Fix handrails that are broken or loose. Make sure that handrails are as long as the stairways.  Check any carpeting to make sure that it is firmly attached to the stairs. Fix any carpet that is loose or worn.  Avoid having throw rugs at the top or bottom of the stairs. If you do have throw rugs, attach them to the floor with carpet tape.  Make sure that you have a light switch at the top of the stairs and the bottom of the stairs. If you do not have them, ask someone to add them for you. What else can I do to help prevent falls?  Wear shoes that:  Do not have high heels.  Have rubber bottoms.  Are comfortable and fit you well.  Are closed at the toe. Do not wear sandals.  If you use a stepladder:  Make sure that it  is fully opened. Do not climb a closed stepladder.  Make sure that both sides of the stepladder are locked into place.  Ask someone to hold it for you, if possible.  Clearly mark and make sure that you can see:  Any grab bars or handrails.  First and last steps.  Where the edge of each step is.  Use tools that help you move around (mobility aids) if they are needed. These include:  Canes.  Walkers.  Scooters.  Crutches.  Turn on the lights when you go into a dark area. Replace any light bulbs as soon as they burn out.  Set up your furniture so you have a clear path. Avoid moving your furniture around.  If any of your floors are uneven, fix them.  If there are any pets around you, be aware of where they are.  Review your medicines with your doctor. Some medicines can make you feel dizzy. This can increase your chance of falling. Ask your doctor what other things that you can do to help prevent falls. This information is not intended to replace advice given to you by your health care provider. Make sure you discuss any questions you have with your health care provider. Document Released: 06/13/2009 Document Revised: 01/23/2016 Document Reviewed: 09/21/2014 Elsevier Interactive Patient Education  2017 [employer].

## 2017-09-09 NOTE — Progress Notes (Signed)
Subjective:   Justin Dunn is a 57 y.o. male who presents for Medicare Annual/Subsequent preventive examination.  Review of Systems:  N/A Cardiac Risk Factors include: advanced age (>56men, >6 women);hypertension;obesity (BMI >30kg/m2);male gender     Objective:    Vitals: BP 132/82   Pulse 92   Temp 98.3 F (36.8 C) (Oral)   Ht 5\' 11"  (1.803 m)   Wt 223 lb 6 oz (101.3 kg)   SpO2 100%   BMI 31.15 kg/m   Body mass index is 31.15 kg/m.  Advanced Directives 09/09/2017 06/12/2014 06/10/2014 05/08/2014 05/05/2014 04/06/2014 01/11/2014  Does Patient Have a Medical Advance Directive? No No No No No Patient does not have advance directive;Patient would not like information Patient does not have advance directive;Patient would not like information  Does patient want to make changes to medical advance directive? Yes (MAU/Ambulatory/Procedural Areas - Information given) - - - - - -  Would patient like information on creating a medical advance directive? - No - patient declined information No - patient declined information - No - patient declined information - -    Tobacco Social History   Tobacco Use  Smoking Status Current Some Day Smoker  . Packs/day: 1.00  . Years: 10.00  . Pack years: 10.00  . Types: Cigarettes, Cigars  Smokeless Tobacco Never Used  Tobacco Comment   currently smokes 2 small cigar per day/  quit cigarettes 2011     Ready to quit: Not Answered Counseling given: Not Answered Comment: currently smokes 2 small cigar per day/  quit cigarettes 2011   Clinical Intake:  Pre-visit preparation completed: Yes  Pain : No/denies pain     Nutritional Status: BMI > 30  Obese Nutritional Risks: Nausea/ vomitting/ diarrhea(Patient is having slight diarrhea) Diabetes: No  How often do you need to have someone help you when you read instructions, pamphlets, or other written materials from your doctor or pharmacy?: 1 - Never What is the last grade level you completed  in school?: Some college   Interpreter Needed?: No  Information entered by :: Andrez Grime, LPN  Past Medical History:  Diagnosis Date  . Anxiety   . At risk for sleep apnea    STOP-BANG= 4   SENT TO PCP 04-02-2014  . Chronic low back pain   . Depression   . ED (erectile dysfunction)   . History of colon polyps   . History of gastric ulcer    as child  . Hypertension   . Hypogonadism male   . Incomplete bladder emptying   . Lumbar spine scoliosis   . Premature ejaculation   . Prostate cancer [facility])    Dr Tammi Klippel ( oncologist) and Dr Loel Lofty [facility])   . Retention of urine, unspecified   . Sleep apnea    states never told to wear CPAP  . Wears glasses    Past Surgical History:  Procedure Laterality Date  . APPENDECTOMY  age 20  . COLONOSCOPY W/ POLYPECTOMY  july 2015  . LUMBAR FUSION  04/ 2012   and rod  . PROSTATE BIOPSY    . RADIOACTIVE SEED IMPLANT N/A 04/06/2014   Procedure: RADIOACTIVE SEED IMPLANT;  Surgeon: [name], MD;  Location: [facility];  Service: Urology;  Laterality: N/A;  dr portable    Family History  Problem Relation Age of Onset  . Prostate cancer Father   . Hypertension Mother   . Pulmonary fibrosis Mother   . Arthritis Mother   .  Diabetes Mother   . Deep vein thrombosis Sister    Social History   Socioeconomic History  . Marital status: Divorced    Spouse name: None  . Number of children: None  . Years of education: None  . Highest education level: Some college, no degree  Social Needs  . Financial resource strain: Hard  . Food insecurity - worry: Often true  . Food insecurity - inability: Often true  . Transportation needs - medical: No  . Transportation needs - non-medical: No  Occupational History  . Occupation: disability    Comment: DDD lumbar spine; 2013  Tobacco Use  . Smoking status: Current Some Day Smoker    Packs/day: 1.00    Years: 10.00    Pack years: 10.00    Types: Cigarettes,  Cigars  . Smokeless tobacco: Never Used  . Tobacco comment: currently smokes 2 small cigar per day/  quit cigarettes 2011  Substance and Sexual Activity  . Alcohol use: Yes    Alcohol/week: 0.0 oz    Comment: 1-2 glasses of wine daily  . Drug use: Yes    Types: Marijuana  . Sexual activity: No    Birth control/protection: Abstinence  Other Topics Concern  . None  Social History Narrative  . None    Outpatient Encounter Medications as of 09/09/2017  Medication Sig  . b complex vitamins tablet Take 1 tablet by mouth daily.  . Calcium-Cholecalciferol (VITA-CALCIUM PO) Take by mouth.  . Cholecalciferol (VITAMIN D3) 1000 units CAPS Take by mouth.  . enalapril (VASOTEC) 20 MG tablet Take 1 tablet (20 mg total) by mouth daily.  . fluticasone (FLONASE) 50 MCG/ACT nasal spray Place 2 sprays into both nostrils daily.  Marland Kitchen gabapentin (NEURONTIN) 300 MG capsule Take 3 capsules (900 mg total) by mouth 3 (three) times daily.  . meloxicam (MOBIC) 15 MG tablet Take 1 tablet (15 mg total) by mouth daily as needed for pain.  . Multiple Vitamins-Minerals-FA (VITATAB MV PO) Take by mouth.  . Olopatadine HCl (PATADAY) 0.2 % SOLN Apply 1 drop to eye daily.  . saw palmetto 160 MG capsule Take 160 mg by mouth 2 (two) times daily.  . sertraline (ZOLOFT) 100 MG tablet Take 2 tablets (200 mg total) by mouth daily.  Marland Kitchen tiZANidine (ZANAFLEX) 4 MG capsule Take 1 capsule (4 mg total) by mouth 3 (three) times daily as needed for muscle spasms.  . traMADol (ULTRAM) 50 MG tablet Take 1-2 tablets (50-100 mg total) by mouth every 12 (twelve) hours as needed.  . traZODone (DESYREL) 100 MG tablet take 1 tablet by mouth at bedtime  . triamcinolone ointment (KENALOG) 0.5 % Apply 1 application topically 2 (two) times daily.  . Turmeric (RA TURMERIC) 500 MG CAPS Take 1 tablet by mouth. Reported on 08/19/2015  . zolpidem (AMBIEN) 10 MG tablet take 1 tablet by mouth at bedtime if needed for sleep   No facility-administered  encounter medications on file as of 09/09/2017.     Activities of Daily Living In your present state of health, do you have any difficulty performing the following activities: 09/09/2017  Hearing? N  Vision? Y  Comment Cannot drive at night due to vision impairment   Difficulty concentrating or making decisions? Y  Comment Patient has issues with remembering small things  Walking or climbing stairs? N  Dressing or bathing? N  Doing errands, shopping? N  Preparing Food and eating ? N  Using the Toilet? N  In the past six months, have  you accidently leaked urine? Y  Comment Patient wears depends daily   Do you have problems with loss of bowel control? N  Managing your Medications? N  Managing your Finances? N  Housekeeping or managing your Housekeeping? N  Some recent data might be hidden    Patient Care Team: Wardell Honour, MD as PCP - General (Family Medicine) Myrlene Broker, MD as Attending Physician (Urology)   Assessment:   This is a routine wellness examination for Justin Dunn.  Exercise Activities and Dietary recommendations Current Exercise Habits: Home exercise routine, Type of exercise: strength training/weights, Time (Minutes): 60, Frequency (Times/Week): 2, Weekly Exercise (Minutes/Week): 120, Intensity: Mild, Exercise limited by: None identified  Goals    . Exercise 3x per week (30 min per time)     Patient states that he wants to exercise more and develop a good core strength. Patient also wants to eat a more healthier diet.        Fall Risk Fall Risk  09/09/2017 05/12/2017 01/06/2017 09/08/2016 06/18/2016  Falls in the past year? Yes No No No Yes  Number falls in past yr: 1 - - - 1  Injury with Fall? No - - - No  Risk for fall due to : Other (Comment) - - - -  Risk for fall due to: Comment Patient was sick and passed out - - - -  Follow up Falls prevention discussed - - - Falls evaluation completed   Is the patient's home free of loose throw rugs in walkways, pet  beds, electrical cords, etc?   yes      Grab bars in the bathroom? no      Handrails on the stairs?   no      Adequate lighting?   yes  Timed Get Up and Go Performed: yes, completed within 30 seconds  Depression Screen PHQ 2/9 Scores 09/09/2017 05/12/2017 01/06/2017 09/08/2016  PHQ - 2 Score 0 0 0 0  PHQ- 9 Score - - - -    Cognitive Function     6CIT Screen 09/09/2017  What Year? 0 points  What month? 0 points  What time? 0 points  Count back from 20 0 points  Months in reverse 0 points  Repeat phrase 0 points  Total Score 0    Immunization History  Administered Date(s) Administered  . Influenza,inj,Quad PF,6+ Mos 05/12/2017  . PPD Test 08/19/2015, 09/08/2016    Qualifies for Shingles Vaccine? Advised patient to check with pharmacy about receiving the Shingrix vaccine  Patient declined tetanus due to insurance. He will pay for it out of pocket at his next office visit he states.  Screening Tests Health Maintenance  Topic Date Due  . TETANUS/TDAP  09/09/2018 (Originally 05/09/1978)  . COLONOSCOPY  08/31/2021  . INFLUENZA VACCINE  Completed  . Hepatitis C Screening  Completed  . HIV Screening  Completed   Cancer Screenings: Lung: Low Dose CT Chest recommended if Age 79-80 years, 30 pack-year currently smoking OR have quit w/in 15years. Patient does not qualify. Patient only smokes cigars occasionally he states.  Colorectal: colonoscopy completed 09/01/2011  Additional Screenings:  Hepatitis B/HIV/Syphillis: HIV completed 12/28/2014, Hep B and Syphillis not indicated Hepatitis C Screening: completed 08/19/2015    Plan:   I have personally reviewed and noted the following in the patient's chart:   . Medical and social history . Use of alcohol, tobacco or illicit drugs  . Current medications and supplements . Functional ability and status . Nutritional status .  Physical activity . Advanced directives . List of other physicians . Hospitalizations, surgeries, and ER  visits in previous 12 months . Vitals . Screenings to include cognitive, depression, and falls . Referrals and appointments  In addition, I have reviewed and discussed with patient certain preventive protocols, quality metrics, and best practice recommendations. A written personalized care plan for preventive services as well as general preventive health recommendations were provided to patient.   1. Essential hypertension - Lipid panel - CBC with Differential/Platelet  2. Lack of food - Ambulatory referral to C3 Care Team  3. Financial difficulties - Ambulatory referral to C3 Care Team  4. Encounter for Medicare annual wellness exam   Andrez Grime, LPN  09/27/1186

## 2017-09-09 NOTE — Telephone Encounter (Signed)
Patient completed his AWV today. Patient states he needs a refill on his Tramadol sent to [facility] on [address].

## 2017-09-10 LAB — LIPID PANEL
CHOL/HDL RATIO: 2.7 ratio (ref 0.0–5.0)
Cholesterol, Total: 146 mg/dL (ref 100–199)
HDL: 54 mg/dL (ref >39)
LDL CALC: 80 mg/dL (ref 0–99)
Triglycerides: 59 mg/dL (ref 0–149)
VLDL CHOLESTEROL CAL: 12 mg/dL (ref 5–40)

## 2017-09-10 LAB — CBC WITH DIFFERENTIAL/PLATELET
BASOS: 0 %
Basophils Absolute: 0 10*3/uL (ref 0.0–0.2)
EOS (ABSOLUTE): 0.1 10*3/uL (ref 0.0–0.4)
EOS: 1 %
HEMATOCRIT: 47.1 % (ref 37.5–51.0)
HEMOGLOBIN: 16 g/dL (ref 13.0–17.7)
Immature Grans (Abs): 0 10*3/uL (ref 0.0–0.1)
Immature Granulocytes: 0 %
LYMPHS ABS: 3.1 10*3/uL (ref 0.7–3.1)
Lymphs: 39 %
MCH: 31.5 pg (ref 26.6–33.0)
MCHC: 34 g/dL (ref 31.5–35.7)
MCV: 93 fL (ref 79–97)
MONOCYTES: 7 %
Monocytes Absolute: 0.5 10*3/uL (ref 0.1–0.9)
NEUTROS ABS: 4.3 10*3/uL (ref 1.4–7.0)
Neutrophils: 53 %
Platelets: 278 10*3/uL (ref 150–379)
RBC: 5.08 x10E6/uL (ref 4.14–5.80)
RDW: 14.6 % (ref 12.3–15.4)
WBC: 8.1 10*3/uL (ref 3.4–10.8)

## 2017-09-10 NOTE — Telephone Encounter (Signed)
Called pt and informed him that his was called in on 9/18 to his pharmacy. Advised pt to call pharmacy to pick up refills.

## 2017-09-10 NOTE — Telephone Encounter (Signed)
Call --- I gave patient refills on his Tramadol in 05/2017.  He needs to call the pharmacy for them to fill.

## 2017-09-13 ENCOUNTER — Ambulatory Visit: Payer: Medicare HMO | Admitting: Family Medicine

## 2017-09-20 ENCOUNTER — Encounter: Payer: Self-pay | Admitting: Family Medicine

## 2017-09-20 ENCOUNTER — Ambulatory Visit (INDEPENDENT_AMBULATORY_CARE_PROVIDER_SITE_OTHER): Payer: Medicare HMO | Admitting: Family Medicine

## 2017-09-20 ENCOUNTER — Other Ambulatory Visit: Payer: Self-pay

## 2017-09-20 VITALS — BP 148/82 | HR 77 | Temp 98.0°F | Resp 16 | Ht 71.26 in | Wt 229.0 lb

## 2017-09-20 DIAGNOSIS — F4323 Adjustment disorder with mixed anxiety and depressed mood: Secondary | ICD-10-CM

## 2017-09-20 DIAGNOSIS — L853 Xerosis cutis: Secondary | ICD-10-CM

## 2017-09-20 DIAGNOSIS — Z6831 Body mass index (BMI) 31.0-31.9, adult: Secondary | ICD-10-CM

## 2017-09-20 DIAGNOSIS — Z0000 Encounter for general adult medical examination without abnormal findings: Secondary | ICD-10-CM

## 2017-09-20 DIAGNOSIS — F5105 Insomnia due to other mental disorder: Secondary | ICD-10-CM

## 2017-09-20 DIAGNOSIS — F99 Mental disorder, not otherwise specified: Secondary | ICD-10-CM | POA: Diagnosis not present

## 2017-09-20 DIAGNOSIS — C61 Malignant neoplasm of prostate: Secondary | ICD-10-CM | POA: Diagnosis not present

## 2017-09-20 DIAGNOSIS — M5030 Other cervical disc degeneration, unspecified cervical region: Secondary | ICD-10-CM

## 2017-09-20 DIAGNOSIS — E6609 Other obesity due to excess calories: Secondary | ICD-10-CM | POA: Diagnosis not present

## 2017-09-20 DIAGNOSIS — R69 Illness, unspecified: Secondary | ICD-10-CM | POA: Diagnosis not present

## 2017-09-20 DIAGNOSIS — I10 Essential (primary) hypertension: Secondary | ICD-10-CM | POA: Diagnosis not present

## 2017-09-20 DIAGNOSIS — M5136 Other intervertebral disc degeneration, lumbar region: Secondary | ICD-10-CM | POA: Diagnosis not present

## 2017-09-20 MED ORDER — TIZANIDINE HCL 4 MG PO CAPS: 4.0000 mg | ORAL_CAPSULE | Freq: Three times a day (TID) | ORAL | 5 refills | Status: DC | PRN

## 2017-09-20 MED ORDER — GABAPENTIN 300 MG PO CAPS: 900.0000 mg | ORAL_CAPSULE | Freq: Three times a day (TID) | ORAL | 5 refills | Status: DC

## 2017-09-20 MED ORDER — MELOXICAM 15 MG PO TABS: 15.0000 mg | ORAL_TABLET | Freq: Every day | ORAL | 1 refills | Status: DC | PRN

## 2017-09-20 MED ORDER — TRAZODONE HCL 100 MG PO TABS: 100.0000 mg | ORAL_TABLET | Freq: Every day | ORAL | 1 refills | Status: DC

## 2017-09-20 MED ORDER — FLUTICASONE PROPIONATE 50 MCG/ACT NA SUSP: 2.0000 | Freq: Every day | NASAL | 11 refills | Status: DC

## 2017-09-20 MED ORDER — ENALAPRIL MALEATE 20 MG PO TABS: 20.0000 mg | ORAL_TABLET | Freq: Every day | ORAL | 1 refills | Status: DC

## 2017-09-20 NOTE — Patient Instructions (Addendum)
IF you received an x-ray today, you will receive an invoice from [facility]. Please contact [facility] at [phone] with questions or concerns regarding your invoice.   IF you received labwork today, you will receive an invoice from [facility]. Please contact [facility] at [phone] with questions or concerns regarding your invoice.   Our billing staff will not be able to assist you with questions regarding bills from these companies.  You will be contacted with the lab results as soon as they are available. The fastest way to get your results is to activate your My Chart account. Instructions are located on the last page of this paperwork. If you have not heard from [place] regarding the results in 2 weeks, please contact this office.     prevenati Preventive Care 40-64 Years, Male Preventive care refers to lifestyle choices and visits with your health care provider that can promote health and wellness. What does preventive care include?  A yearly physical exam. This is also called an annual well check.  Dental exams once or twice a year.  Routine eye exams. Ask your health care provider how often you should have your eyes checked.  Personal lifestyle choices, including: ? Daily care of your teeth and gums. ? Regular physical activity. ? Eating a healthy diet. ? Avoiding tobacco and drug use. ? Limiting alcohol use. ? Practicing safe sex. ? Taking low-dose aspirin every day starting at age 68. What happens during an annual well check? The services and screenings done by your health care provider during your annual well check will depend on your age, overall health, lifestyle risk factors, and family history of disease. Counseling Your health care provider may ask you questions about your:  Alcohol use.  Tobacco use.  Drug use.  Emotional well-being.  Home and relationship well-being.  Sexual activity.  Eating habits.  Work and work  Statistician.  Screening You may have the following tests or measurements:  Height, weight, and BMI.  Blood pressure.  Lipid and cholesterol levels. These may be checked every 5 years, or more frequently if you are over 62 years old.  Skin check.  Lung cancer screening. You may have this screening every year starting at age 88 if you have a 30-pack-year history of smoking and currently smoke or have quit within the past 15 years.  Fecal occult blood test (FOBT) of the stool. You may have this test every year starting at age 9.  Flexible sigmoidoscopy or colonoscopy. You may have a sigmoidoscopy every 5 years or a colonoscopy every 10 years starting at age 39.  Prostate cancer screening. Recommendations will vary depending on your family history and other risks.  Hepatitis C blood test.  Hepatitis B blood test.  Sexually transmitted disease (STD) testing.  Diabetes screening. This is done by checking your blood sugar (glucose) after you have not eaten for a while (fasting). You may have this done every 1-3 years.  Discuss your test results, treatment options, and if necessary, the need for more tests with your health care provider. Vaccines Your health care provider may recommend certain vaccines, such as:  Influenza vaccine. This is recommended every year.  Tetanus, diphtheria, and acellular pertussis (Tdap, Td) vaccine. You may need a Td booster every 10 years.  Varicella vaccine. You may need this if you have not been vaccinated.  Zoster vaccine. You may need this after age 41.  Measles, mumps, and rubella (MMR) vaccine. You may need at least one dose  of MMR if you were born in 1957 or later. You may also need a second dose.  Pneumococcal 13-valent conjugate (PCV13) vaccine. You may need this if you have certain conditions and have not been vaccinated.  Pneumococcal polysaccharide (PPSV23) vaccine. You may need one or two doses if you smoke cigarettes or if you have  certain conditions.  Meningococcal vaccine. You may need this if you have certain conditions.  Hepatitis A vaccine. You may need this if you have certain conditions or if you travel or work in places where you may be exposed to hepatitis A.  Hepatitis B vaccine. You may need this if you have certain conditions or if you travel or work in places where you may be exposed to hepatitis B.  Haemophilus influenzae type b (Hib) vaccine. You may need this if you have certain risk factors.  Talk to your health care provider about which screenings and vaccines you need and how often you need them. This information is not intended to replace advice given to you by your health care provider. Make sure you discuss any questions you have with your health care provider. Document Released: 09/13/2015 Document Revised: 05/06/2016 Document Reviewed: 06/18/2015 Elsevier Interactive Patient Education  [employer].

## 2017-09-20 NOTE — Progress Notes (Signed)
Subjective:    Patient ID: Justin Dunn, male    DOB: [birth year], 59 y.o.   MRN: [id number]  09/20/2017  Annual Exam and Hypertension    HPI This 57 y.o. male presents for Routine Physical Examination and  three month follow-up of hypertension, DDD lumbar spine,  Last physical: 08-2016; s/p AWV 09/09/17 with LPN Calandra. Colonoscopy:  2013 PSA:  2018   BP Readings from Last 3 Encounters:  09/20/17 (!) 148/82  09/09/17 132/82  05/12/17 128/80   Wt Readings from Last 3 Encounters:  09/20/17 229 lb (103.9 kg)  09/09/17 223 lb 6 oz (101.3 kg)  05/12/17 218 lb (98.9 kg)   Immunization History  Administered Date(s) Administered  . Influenza,inj,Quad PF,6+ Mos 05/12/2017  . PPD Test 08/19/2015, 09/08/2016   Health Maintenance  Topic Date Due  . TETANUS/TDAP  09/09/2018 (Originally 05/[birth year]/1978)  . COLONOSCOPY  08/31/2021  . INFLUENZA VACCINE  Completed  . Hepatitis C Screening  Completed  . HIV Screening  Completed   Hypertension: Patient reports good compliance with medication, good tolerance to medication, and good symptom control.    DDD lumbar spine: Tramadol is not helping with lower back pain.  When flares, takes Meloxicam and Gabapentin.  Has been taking Tramadol for several years; has become immune to it.  If does chores around house, doing laundry, leaning over causes pain.  Taking Meloxicam and Gabapentin.  Underwent orthopedic specialist; recommended lumbar injections yet patient declined.  Also offered lumbar spine surgery revision; declined.    Prostate cancer: s/p follow-up with Dr. Rosana Hoes.  0.2 at last visit in May 2018.  Upcoming appointment with urology/[facility] this week.    Anxiety and depression: Patient reports POOR compliance with medication, POOR tolerance to medication, and good symptom control.  Stopped Zoloft two weeks ago; was starting to feel weird on Zoloft.  Mood is stable; no sadness or depressive symptoms.  Insomnia: Patient reports good compliance  with medication, good tolerance to medication, and good symptom control.    Wants to take Nugenics.    Review of Systems  Constitutional: Negative for activity change, appetite change, chills, diaphoresis, fatigue, fever and unexpected weight change.  HENT: Negative for congestion, dental problem, drooling, ear discharge, ear pain, facial swelling, hearing loss, mouth sores, nosebleeds, postnasal drip, rhinorrhea, sinus pressure, sneezing, sore throat, tinnitus, trouble swallowing and voice change.   Eyes: Negative for photophobia, pain, discharge, redness, itching and visual disturbance.  Respiratory: Negative for apnea, cough, choking, chest tightness, shortness of breath, wheezing and stridor.   Cardiovascular: Negative for chest pain, palpitations and leg swelling.  Gastrointestinal: Negative for abdominal pain, blood in stool, constipation, diarrhea, nausea and vomiting.  Endocrine: Negative for cold intolerance, heat intolerance, polydipsia, polyphagia and polyuria.  Genitourinary: Negative for decreased urine volume, difficulty urinating, discharge, dysuria, enuresis, flank pain, frequency, genital sores, hematuria, penile pain, penile swelling, scrotal swelling, testicular pain and urgency.  Musculoskeletal: Positive for back pain. Negative for arthralgias, gait problem, joint swelling, myalgias, neck pain and neck stiffness.  Skin: Negative for color change, pallor, rash and wound.  Allergic/Immunologic: Negative for environmental allergies, food allergies and immunocompromised state.  Neurological: Negative for dizziness, tremors, seizures, syncope, facial asymmetry, speech difficulty, weakness, light-headedness, numbness and headaches.  Hematological: Negative for adenopathy. Does not bruise/bleed easily.  Psychiatric/Behavioral: Positive for sleep disturbance. Negative for agitation, behavioral problems, confusion, decreased concentration, dysphoric mood, hallucinations, self-injury and  suicidal ideas. The patient is not nervous/anxious and is not hyperactive.  Bedtime 900; wakes up 300.    Past Medical History:  Diagnosis Date  . Anxiety   . At risk for sleep apnea    STOP-BANG= 4   SENT TO PCP 04-02-2014  . Chronic low back pain   . Depression   . ED (erectile dysfunction)   . History of colon polyps   . History of gastric ulcer    as child  . Hypertension   . Hypogonadism male   . Incomplete bladder emptying   . Lumbar spine scoliosis   . Premature ejaculation   . Prostate cancer [facility])    Dr Tammi Klippel ( oncologist) and Dr Loel Lofty [facility])   . Retention of urine, unspecified   . Sleep apnea    states never told to wear CPAP  . Wears glasses    Past Surgical History:  Procedure Laterality Date  . APPENDECTOMY  age 70  . COLONOSCOPY W/ POLYPECTOMY  july 2015  . LUMBAR FUSION  04/ 2012   and rod  . PROSTATE BIOPSY    . RADIOACTIVE SEED IMPLANT N/A 04/06/2014   Procedure: RADIOACTIVE SEED IMPLANT;  Surgeon: [name], MD;  Location: [facility];  Service: Urology;  Laterality: N/A;  dr portable    No Known Allergies Current Outpatient Medications on File Prior to Visit  Medication Sig Dispense Refill  . b complex vitamins tablet Take 1 tablet by mouth daily.    . Calcium-Cholecalciferol (VITA-CALCIUM PO) Take by mouth.    . Cholecalciferol (VITAMIN D3) 1000 units CAPS Take by mouth.    . Multiple Vitamins-Minerals-FA (VITATAB MV PO) Take by mouth.    . Olopatadine HCl (PATADAY) 0.2 % SOLN Apply 1 drop to eye daily. 2.5 mL 3  . saw palmetto 160 MG capsule Take 160 mg by mouth 2 (two) times daily.    Marland Kitchen triamcinolone ointment (KENALOG) 0.5 % Apply 1 application topically 2 (two) times daily. 30 g 0  . Turmeric (RA TURMERIC) 500 MG CAPS Take 1 tablet by mouth. Reported on 08/19/2015    . zolpidem (AMBIEN) 10 MG tablet take 1 tablet by mouth at bedtime if needed for sleep 30 tablet 5   No current facility-administered  medications on file prior to visit.    Social History   Socioeconomic History  . Marital status: Divorced    Spouse name: Not on file  . Number of children: Not on file  . Years of education: Not on file  . Highest education level: Some college, no degree  Social Needs  . Financial resource strain: Hard  . Food insecurity - worry: Often true  . Food insecurity - inability: Often true  . Transportation needs - medical: No  . Transportation needs - non-medical: No  Occupational History  . Occupation: disability    Comment: DDD lumbar spine; 2013  Tobacco Use  . Smoking status: Current Some Day Smoker    Packs/day: 1.00    Years: 10.00    Pack years: 10.00    Types: Cigarettes, Cigars  . Smokeless tobacco: Never Used  . Tobacco comment: currently smokes 2 small cigar per day/  quit cigarettes 2011  Substance and Sexual Activity  . Alcohol use: Yes    Alcohol/week: 0.0 oz    Comment: 1-2 glasses of wine daily  . Drug use: Yes    Types: Marijuana  . Sexual activity: No    Birth control/protection: Abstinence  Other Topics Concern  . Not on file  Social History  Narrative   Marital status: divorced; dating in 2019      Children: one foster son (autistic_      Lives: with foster son      Employment: disability from DDD lumbar      Tobacco: none      Alcohol: none      Drugs:      Exercise: minimal in 2019.      ADLs; drives; independent with ADLs.      Advanced Directives: none; FULL CODE.  Johnsie Cancel Retter/[phone] [employer] [phone].   Family History  Problem Relation Age of Onset  . Prostate cancer Father   . Hypertension Mother   . Pulmonary fibrosis Mother   . Arthritis Mother   . Diabetes Mother   . Deep vein thrombosis Sister        Objective:    BP (!) 148/82   Pulse 77   Temp 98 F (36.7 C) (Oral)   Resp 16   Ht 5' 11.26" (1.81 m)   Wt 229 lb (103.9 kg)   SpO2 97%   BMI 31.71 kg/m   Physical Exam  Constitutional: He is  oriented to person, place, and time. He appears well-developed and well-nourished. No distress.  HENT:  Head: Normocephalic and atraumatic.  Right Ear: External ear normal.  Left Ear: External ear normal.  Nose: Nose normal.  Mouth/Throat: Oropharynx is clear and moist.  Eyes: Conjunctivae and EOM are normal. Pupils are equal, round, and reactive to light.  Neck: Normal range of motion. Neck supple. Carotid bruit is not present. No thyromegaly present.  Cardiovascular: Normal rate, regular rhythm, normal heart sounds and intact distal pulses. Exam reveals no gallop and no friction rub.  No murmur heard. Pulmonary/Chest: Effort normal and breath sounds normal. He has no wheezes. He has no rales.  Abdominal: Soft. Bowel sounds are normal. He exhibits no distension and no mass. There is no tenderness. There is no rebound and no guarding.  Genitourinary: Penis normal.  Musculoskeletal:       Right shoulder: Normal.       Left shoulder: Normal.       Cervical back: Normal.  Lymphadenopathy:    He has no cervical adenopathy.  Neurological: He is alert and oriented to person, place, and time. He has normal reflexes. No cranial nerve deficit. He exhibits normal muscle tone. Coordination normal.  Skin: Skin is warm and dry. No rash noted. He is not diaphoretic.  Psychiatric: He has a normal mood and affect. His behavior is normal. Judgment and thought content normal.  Nursing note and vitals reviewed.  No results found. Depression screen [facility] 2/9 09/20/2017 09/09/2017 05/12/2017 01/06/2017 09/08/2016  Decreased Interest 0 0 0 0 0  Down, Depressed, Hopeless 0 0 0 0 0  PHQ - 2 Score 0 0 0 0 0  Altered sleeping - - - - -  Tired, decreased energy - - - - -  Change in appetite - - - - -  Feeling bad or failure about yourself  - - - - -  Trouble concentrating - - - - -  Moving slowly or fidgety/restless - - - - -  Suicidal thoughts - - - - -  PHQ-9 Score - - - - -  Difficult doing work/chores - - - -  -   Fall Risk  09/20/2017 09/09/2017 05/12/2017 01/06/2017 09/08/2016  Falls in the past year? Yes Yes No No No  Number falls in past yr: 1 1 - - -  Injury with Fall? - No - - -  Risk for fall due to : - Other (Comment) - - -  Risk for fall due to: Comment - Patient was sick and passed out - - -  Follow up - Falls prevention discussed - - -        Assessment & Plan:   1. Routine physical examination   2. Essential (primary) hypertension   3. Degenerative disc disease, cervical   4. Degenerative disc disease, lumbar   5. Malignant neoplasm of prostate ([place])   6. Adjustment disorder with mixed anxiety and depressed mood   7. Insomnia due to other mental disorder   8. Dry skin dermatitis   9. Class 1 obesity due to excess calories with serious comorbidity and body mass index (BMI) of 31.0 to 31.9 in adult    -anticipatory guidance provided --- exercise, weight loss, safe driving practices, aspirin 81mg  daily. -obtain age appropriate screening labs and labs for chronic disease management. -moderate fall risk; no evidence of depression; no evidence of hearing loss.  Discussed advanced directives and living will; also discussed end of life issues including code status.  -Hypertension well controlled on current medication.  Recent labs reviewed in detail during visit. -Anxiety and depression has improved and patient discontinued sertraline therapy in the past 2 weeks.  Agreed to discontinue medication and monitor mood over the next 3 months. -Chronic lower back pain due to degenerative disc disease disease in the lumbar and cervical spines.  Tramadol no longer effective for pain control.  Patient did not initiate tizanidine therapy after last visit.  Recommend discontinuing tramadol therapy due to lack of improvement in back pain.  Recommend taking tizanidine therapy 3 times daily as needed for muscle spasms.  If pain continues to be uncontrolled, recommend referral to pain management.  Patient  declined referral today.  He would like to consider this over the next several months.  Continue Meloxicam and Gabapentin for chronic lower back pain. -History of prostate cancer.  Continues to follow closely with urology every 6 months. -Continue trazodone for ongoing insomnia. -Suffering with persistent nasal congestion.  Recommend Flonase daily for allergic rhinitis. -Patient has gained significant weight in the past year.-recommend weight loss, exercise for 30-60 minutes five days per week; recommend 2000 kcal restriction per day with a minimum of 60 grams of protein per day.   -Per [facility] controlled substance registry, last fill of Zolpidem on 08/11/17.  Last fill of tramadol was on 05/12/2017.   Orders Placed This Encounter  Procedures  . POCT urinalysis dipstick   Meds ordered this encounter  Medications  . tiZANidine (ZANAFLEX) 4 MG capsule    Sig: Take 1 capsule (4 mg total) by mouth 3 (three) times daily as needed for muscle spasms.    Dispense:  90 capsule    Refill:  5  . enalapril (VASOTEC) 20 MG tablet    Sig: Take 1 tablet (20 mg total) by mouth daily.    Dispense:  90 tablet    Refill:  1  . fluticasone (FLONASE) 50 MCG/ACT nasal spray    Sig: Place 2 sprays into both nostrils daily.    Dispense:  16 g    Refill:  11  . gabapentin (NEURONTIN) 300 MG capsule    Sig: Take 3 capsules (900 mg total) by mouth 3 (three) times daily.    Dispense:  270 capsule    Refill:  5  . meloxicam (MOBIC) 15 MG tablet  Sig: Take 1 tablet (15 mg total) by mouth daily as needed for pain.    Dispense:  90 tablet    Refill:  1  . traZODone (DESYREL) 100 MG tablet    Sig: Take 1 tablet (100 mg total) by mouth at bedtime.    Dispense:  90 tablet    Refill:  1    Return in about 3 months (around 12/19/2017) for follow-up chronic medical conditions.   Kristi Elayne Guerin, M.D. Primary Care at [facility] previously [facility] [place] [address] [place], [place]  [address] [phone] phone [phone] fax

## 2017-09-21 ENCOUNTER — Telehealth: Payer: Self-pay

## 2017-09-21 ENCOUNTER — Ambulatory Visit: Payer: Medicare HMO

## 2017-09-21 LAB — POCT URINALYSIS DIP (MANUAL ENTRY)
BILIRUBIN UA: NEGATIVE
GLUCOSE UA: NEGATIVE mg/dL
Ketones, POC UA: NEGATIVE mg/dL
NITRITE UA: NEGATIVE
Protein Ur, POC: NEGATIVE mg/dL
Spec Grav, UA: 1.025 (ref 1.010–1.025)
UROBILINOGEN UA: 0.2 U/dL
pH, UA: 6.5 (ref 5.0–8.0)

## 2017-09-21 NOTE — Telephone Encounter (Signed)
I have entered to POC urine on this pt. He returned on 09/21/17 to have it collected. Please let us know if you would like for [place] to send off for culture.

## 2017-09-24 DIAGNOSIS — E6609 Other obesity due to excess calories: Secondary | ICD-10-CM | POA: Insufficient documentation

## 2017-09-24 DIAGNOSIS — N320 Bladder-neck obstruction: Secondary | ICD-10-CM | POA: Diagnosis not present

## 2017-09-24 DIAGNOSIS — C61 Malignant neoplasm of prostate: Secondary | ICD-10-CM | POA: Diagnosis not present

## 2017-09-24 DIAGNOSIS — R69 Illness, unspecified: Secondary | ICD-10-CM | POA: Diagnosis not present

## 2017-09-24 DIAGNOSIS — Z6831 Body mass index (BMI) 31.0-31.9, adult: Secondary | ICD-10-CM

## 2017-09-24 DIAGNOSIS — Z8546 Personal history of malignant neoplasm of prostate: Secondary | ICD-10-CM | POA: Diagnosis not present

## 2017-09-30 ENCOUNTER — Encounter: Payer: Self-pay | Admitting: Family Medicine

## 2017-10-13 ENCOUNTER — Telehealth: Payer: Self-pay

## 2017-10-13 NOTE — Telephone Encounter (Signed)
Called pt to discuss community resource request, per last visit with nurse health advisor. Will mail pt the requested resources.    Justin Dunn, B.A.  Care Guide - [facility] [place]

## 2017-11-04 DIAGNOSIS — Z0100 Encounter for examination of eyes and vision without abnormal findings: Secondary | ICD-10-CM | POA: Diagnosis not present

## 2017-11-04 DIAGNOSIS — H5200 Hypermetropia, unspecified eye: Secondary | ICD-10-CM | POA: Diagnosis not present

## 2017-12-22 ENCOUNTER — Ambulatory Visit: Payer: Medicare HMO | Admitting: Family Medicine

## 2017-12-29 ENCOUNTER — Other Ambulatory Visit: Payer: Self-pay

## 2017-12-29 ENCOUNTER — Ambulatory Visit (INDEPENDENT_AMBULATORY_CARE_PROVIDER_SITE_OTHER): Payer: Medicare HMO | Admitting: Family Medicine

## 2017-12-29 ENCOUNTER — Encounter: Payer: Self-pay | Admitting: Family Medicine

## 2017-12-29 ENCOUNTER — Ambulatory Visit: Payer: Medicare HMO

## 2017-12-29 VITALS — BP 150/82 | HR 99 | Temp 98.0°F | Resp 16 | Ht 70.87 in | Wt 217.0 lb

## 2017-12-29 DIAGNOSIS — M542 Cervicalgia: Secondary | ICD-10-CM | POA: Diagnosis not present

## 2017-12-29 DIAGNOSIS — R69 Illness, unspecified: Secondary | ICD-10-CM | POA: Diagnosis not present

## 2017-12-29 DIAGNOSIS — I10 Essential (primary) hypertension: Secondary | ICD-10-CM | POA: Diagnosis not present

## 2017-12-29 DIAGNOSIS — F411 Generalized anxiety disorder: Secondary | ICD-10-CM

## 2017-12-29 DIAGNOSIS — M5030 Other cervical disc degeneration, unspecified cervical region: Secondary | ICD-10-CM

## 2017-12-29 DIAGNOSIS — C61 Malignant neoplasm of prostate: Secondary | ICD-10-CM | POA: Diagnosis not present

## 2017-12-29 DIAGNOSIS — M5136 Other intervertebral disc degeneration, lumbar region: Secondary | ICD-10-CM | POA: Diagnosis not present

## 2017-12-29 DIAGNOSIS — F4323 Adjustment disorder with mixed anxiety and depressed mood: Secondary | ICD-10-CM | POA: Diagnosis not present

## 2017-12-29 DIAGNOSIS — F99 Mental disorder, not otherwise specified: Secondary | ICD-10-CM | POA: Diagnosis not present

## 2017-12-29 DIAGNOSIS — F5105 Insomnia due to other mental disorder: Secondary | ICD-10-CM

## 2017-12-29 DIAGNOSIS — M51369 Other intervertebral disc degeneration, lumbar region without mention of lumbar back pain or lower extremity pain: Secondary | ICD-10-CM

## 2017-12-29 DIAGNOSIS — Z111 Encounter for screening for respiratory tuberculosis: Secondary | ICD-10-CM | POA: Diagnosis not present

## 2017-12-29 MED ORDER — AMLODIPINE BESYLATE 2.5 MG PO TABS: 2.5000 mg | ORAL_TABLET | Freq: Every day | ORAL | 1 refills | Status: DC

## 2017-12-29 MED ORDER — TIZANIDINE HCL 4 MG PO CAPS: 4.0000 mg | ORAL_CAPSULE | Freq: Three times a day (TID) | ORAL | 5 refills | Status: DC | PRN

## 2017-12-29 MED ORDER — ENALAPRIL MALEATE 20 MG PO TABS: 20.0000 mg | ORAL_TABLET | Freq: Every day | ORAL | 1 refills | Status: DC

## 2017-12-29 NOTE — Progress Notes (Signed)
Subjective:    Patient ID: Justin Dunn., male    DOB: 1961/02/01, 57 y.o.   MRN: [id number]  12/29/2017  Chronic Conditions (6 month follow-up) and Shoulder Pain (pt states his shoulders has been giving him trouble )    HPI This 57 y.o. male presents for six month follow-up of hypertension, anxiety/depression, DDD lumbar and cervical spines, insomnia, history of prostate cancer.  Management changes made at last visit include the following: -Hypertension well controlled on current medication.  Recent labs reviewed in detail during visit. -Anxiety and depression has improved and patient discontinued sertraline therapy in the past 2 weeks.  Agreed to discontinue medication and monitor mood over the next 3 months. -Chronic lower back pain due to degenerative disc disease disease in the lumbar and cervical spines.  Tramadol no longer effective for pain control.  Patient did not initiate tizanidine therapy after last visit.  Recommend discontinuing tramadol therapy due to lack of improvement in back pain.  Recommend taking tizanidine therapy 3 times daily as needed for muscle spasms.  If pain continues to be uncontrolled, recommend referral to pain management.  Patient declined referral today.  He would like to consider this over the next several months.  Continue Meloxicam and Gabapentin for chronic lower back pain. -History of prostate cancer.  Continues to follow closely with urology every 6 months. -Continue trazodone for ongoing insomnia. -Suffering with persistent nasal congestion.  Recommend Flonase daily for allergic rhinitis. -Patient has gained significant weight in the past year.-recommend weight loss, exercise for 30-60 minutes five days per week; recommend 2000 kcal restriction per day with a minimum of 60 grams of protein per day.   -Per [facility] controlled substance registry, last fill of Zolpidem on 08/11/17.  Last fill of tramadol was on 05/12/2017.  UPDATE: Son who is  autistic has recently diagnosed with DMII; no fried foods; eating grilled and broiled with greens with sweet potatoes.   Upper shoulder pain B especially when going asleep and getting up.  When gettingup in middle of night, feels sharp pain in LEFT upper back/shoulder.  Pruchased biofreeze on shoulders and lower back.  Doing yard work yesterday; working in yard and lower back flared up so badly.  Taking Gabapentin daily.  Also prescribed Meloxicam with some improved; also taking Trazodone and Zolipidem. Taking Gabapentin 900mg  twice daily. Has purchased stretch bands to improve strength.   Suffering with more anxiety due to caregiver to autistic . Lacking motivation to complete tasks.   Started oral B12.  Saw on television.   Home BP not checking. Anxiety is present daily at times.  Duration of anxiety 15-20 minutes.  Short duration.    BP Readings from Last 3 Encounters:  12/29/17 (!) 150/82  09/20/17 (!) 148/82  09/09/17 132/82   Wt Readings from Last 3 Encounters:  12/29/17 217 lb (98.4 kg)  09/20/17 229 lb (103.9 kg)  09/09/17 223 lb 6 oz (101.3 kg)   Immunization History  Administered Date(s) Administered  . Influenza,inj,Quad PF,6+ Mos 05/12/2017  . PPD Test 08/19/2015, 09/08/2016    Review of Systems  Constitutional: Negative for activity change, appetite change, chills, diaphoresis, fatigue, fever and unexpected weight change.  HENT: Negative for congestion, dental problem, drooling, ear discharge, ear pain, facial swelling, hearing loss, mouth sores, nosebleeds, postnasal drip, rhinorrhea, sinus pressure, sneezing, sore throat, tinnitus, trouble swallowing and voice change.   Eyes: Negative for photophobia, pain, discharge, redness, itching and visual disturbance.  Respiratory: Negative for apnea, cough,  choking, chest tightness, shortness of breath, wheezing and stridor.   Cardiovascular: Negative for chest pain, palpitations and leg swelling.  Gastrointestinal: Negative for  abdominal pain, blood in stool, constipation, diarrhea, nausea and vomiting.  Endocrine: Negative for cold intolerance, heat intolerance, polydipsia, polyphagia and polyuria.  Genitourinary: Negative for decreased urine volume, difficulty urinating, discharge, dysuria, enuresis, flank pain, frequency, genital sores, hematuria, penile pain, penile swelling, scrotal swelling, testicular pain and urgency.  Musculoskeletal: Positive for back pain, myalgias, neck pain and neck stiffness. Negative for arthralgias, gait problem and joint swelling.  Skin: Negative for color change, pallor, rash and wound.  Allergic/Immunologic: Negative for environmental allergies, food allergies and immunocompromised state.  Neurological: Negative for dizziness, tremors, seizures, syncope, facial asymmetry, speech difficulty, weakness, light-headedness, numbness and headaches.  Hematological: Negative for adenopathy. Does not bruise/bleed easily.  Psychiatric/Behavioral: Positive for sleep disturbance. Negative for agitation, behavioral problems, confusion, decreased concentration, dysphoric mood, hallucinations, self-injury and suicidal ideas. The patient is nervous/anxious. The patient is not hyperactive.     Past Medical History:  Diagnosis Date  . Anxiety   . At risk for sleep apnea    STOP-BANG= 4   SENT TO PCP 04-02-2014  . Chronic low back pain   . Depression   . ED (erectile dysfunction)   . History of colon polyps   . History of gastric ulcer    as child  . Hypertension   . Hypogonadism male   . Incomplete bladder emptying   . Lumbar spine scoliosis   . Premature ejaculation   . Prostate cancer [facility])    Dr Tammi Klippel ( oncologist) and Dr Loel [facility])   . Retention of urine, unspecified   . Sleep apnea    states never told to wear CPAP  . Wears glasses    Past Surgical History:  Procedure Laterality Date  . APPENDECTOMY  age 58  . COLONOSCOPY W/ POLYPECTOMY  july 2015  . LUMBAR FUSION   04/ 2012   and rod  . PROSTATE BIOPSY    . RADIOACTIVE SEED IMPLANT N/A 04/06/2014   Procedure: RADIOACTIVE SEED IMPLANT;  Surgeon: [name], MD;  Location: [facility];  Service: Urology;  Laterality: N/A;  dr portable    No Known Allergies Current Outpatient Medications on File Prior to Visit  Medication Sig Dispense Refill  . b complex vitamins tablet Take 1 tablet by mouth daily.    . Calcium-Cholecalciferol (VITA-CALCIUM PO) Take by mouth.    . Cholecalciferol (VITAMIN D3) 1000 units CAPS Take by mouth.    . fluticasone (FLONASE) 50 MCG/ACT nasal spray Place 2 sprays into both nostrils daily. 16 g 11  . Multiple Vitamins-Minerals-FA (VITATAB MV PO) Take by mouth.    . Olopatadine HCl (PATADAY) 0.2 % SOLN Apply 1 drop to eye daily. 2.5 mL 3  . saw palmetto 160 MG capsule Take 160 mg by mouth 2 (two) times daily.    Marland Kitchen triamcinolone ointment (KENALOG) 0.5 % Apply 1 application topically 2 (two) times daily. 30 g 0  . Turmeric (RA TURMERIC) 500 MG CAPS Take 1 tablet by mouth. Reported on 08/19/2015     No current facility-administered medications on file prior to visit.    Social History   Socioeconomic History  . Marital status: Divorced    Spouse name: Not on file  . Number of children: Not on file  . Years of education: Not on file  . Highest education level: Some college, no degree  Occupational History  . Occupation: disability    Comment: DDD lumbar spine; 2013  Social Needs  . Financial resource strain: Hard  . Food insecurity:    Worry: Often true    Inability: Often true  . Transportation needs:    Medical: No    Non-medical: No  Tobacco Use  . Smoking status: Current Some Day Smoker    Packs/day: 1.00    Years: 10.00    Pack years: 10.00    Types: Cigarettes, Cigars  . Smokeless tobacco: Never Used  . Tobacco comment: currently smokes 2 small cigar per day/  quit cigarettes 2011  Substance and Sexual Activity  . Alcohol use: Yes     Alcohol/week: 0.0 oz    Comment: 1-2 glasses of wine daily  . Drug use: Yes    Types: Marijuana  . Sexual activity: Never    Birth control/protection: Abstinence  Lifestyle  . Physical activity:    Days per week: 2 days    Minutes per session: 60 min  . Stress: Rather much  Relationships  . Social connections:    Talks on phone: More than three times a week    Gets together: More than three times a week    Attends religious service: More than 4 times per year    Active member of club or organization: No    Attends meetings of clubs or organizations: Never    Relationship status: Divorced  . Intimate partner violence:    Fear of current or ex partner: No    Emotionally abused: No    Physically abused: No    Forced sexual activity: No  Other Topics Concern  . Not on file  Social History Narrative   Marital status: divorced; dating in 2019      Children: one foster son (autistic_      Lives: with foster son      Employment: disability from DDD lumbar      Tobacco: none      Alcohol: none      Drugs:      Exercise: minimal in 2019.      ADLs; drives; independent with ADLs.      Advanced Directives: none; FULL CODE.  Johnsie Cancel [phone] [facility] [phone].   Family History  Problem Relation Age of Onset  . Prostate cancer Father   . Hypertension Mother   . Pulmonary fibrosis Mother   . Arthritis Mother   . Diabetes Mother   . Deep vein thrombosis Sister        Objective:    BP (!) 150/82   Pulse 99   Temp 98 F (36.7 C) (Oral)   Resp 16   Ht 5' 10.87" (1.8 m)   Wt 217 lb (98.4 kg)   SpO2 99%   BMI 30.38 kg/m  Physical Exam  Constitutional: He is oriented to person, place, and time. He appears well-developed and well-nourished. No distress.  HENT:  Head: Normocephalic and atraumatic.  Right Ear: External ear normal.  Left Ear: External ear normal.  Nose: Nose normal.  Mouth/Throat: Oropharynx is clear and moist.  Eyes: Pupils are  equal, round, and reactive to light. Conjunctivae and EOM are normal.  Neck: Normal range of motion. Neck supple. Carotid bruit is not present. No thyromegaly present.  Cardiovascular: Normal rate, regular rhythm, normal heart sounds and intact distal pulses. Exam reveals no gallop and no friction rub.  No murmur heard. Pulmonary/Chest: Effort normal and breath sounds normal. He has no  wheezes. He has no rales.  Abdominal: Soft. Bowel sounds are normal. He exhibits no distension and no mass. There is no tenderness. There is no rebound and no guarding.  Musculoskeletal:       Right shoulder: Normal.       Left shoulder: Normal.       Cervical back: He exhibits decreased range of motion, tenderness, pain and spasm. He exhibits no bony tenderness and normal pulse.       Lumbar back: He exhibits pain and spasm. He exhibits normal range of motion, no tenderness, no bony tenderness and no edema.  Lymphadenopathy:    He has no cervical adenopathy.  Neurological: He is alert and oriented to person, place, and time. He has normal reflexes. No cranial nerve deficit. He exhibits normal muscle tone. Coordination normal.  Skin: Skin is warm and dry. No rash noted. He is not diaphoretic.  Psychiatric: He has a normal mood and affect. His behavior is normal. Judgment and thought content normal.   No results found. Depression screen [facility] 2/9 12/29/2017 09/20/2017 09/09/2017 05/12/2017 01/06/2017  Decreased Interest 0 0 0 0 0  Down, Depressed, Hopeless 0 0 0 0 0  PHQ - 2 Score 0 0 0 0 0  Altered sleeping - - - - -  Tired, decreased energy - - - - -  Change in appetite - - - - -  Feeling bad or failure about yourself  - - - - -  Trouble concentrating - - - - -  Moving slowly or fidgety/restless - - - - -  Suicidal thoughts - - - - -  PHQ-9 Score - - - - -  Difficult doing work/chores - - - - -   Fall Risk  12/29/2017 09/20/2017 09/09/2017 05/12/2017 01/06/2017  Falls in the past year? No Yes Yes No No  Number falls  in past yr: - 1 1 - -  Injury with Fall? - - No - -  Risk for fall due to : - - Other (Comment) - -  Risk for fall due to: Comment - - Patient was sick and passed out - -  Follow up - - Falls prevention discussed - -        Assessment & Plan:   1. Neck pain   2. Essential (primary) hypertension   3. Degenerative disc disease, lumbar   4. Malignant neoplasm of prostate ([facility])   5. Screening examination for pulmonary tuberculosis   6. Anxiety state   7. Degenerative disc disease, cervical   8. Insomnia due to other mental disorder   9. Adjustment disorder with mixed anxiety and depressed mood     Hypertension: uncontrolled; add Amlodipine to Vasotec; avoid diuretics due to previous dehydration with diuretic therapy.  Obtain labs.  Neck pain with history of DDD cervical spine: New/recurrent; obtain cervical spine films; recommend Zanaflex three times daily, home exercise program.  Anxiety with depression: worsening with discontinuation of Zoloft therapy in the past six months; refer to psychotherapy per patient request; recommend daily exercise for anxiety management.  Tuberculosis screening: obtain Tb gold for foster parenting program.  DDD lumbar: stable; chronic issue.   Orders Placed This Encounter  Procedures  . DG Cervical Spine Complete    Standing Status:   Future    Number of Occurrences:   1    Standing Expiration Date:   12/29/2018    Order Specific Question:   Reason for Exam (SYMPTOM  OR DIAGNOSIS REQUIRED)    Answer:  neck pain B; no trauma    Order Specific Question:   Preferred imaging location?    Answer:   External  . DG Cervical Spine Complete    Standing Status:   Future    Number of Occurrences:   1    Standing Expiration Date:   03/03/2019    Order Specific Question:   Reason for Exam (SYMPTOM  OR DIAGNOSIS REQUIRED)    Answer:   pain    Order Specific Question:   Preferred imaging location?    Answer:   External  . CBC with Differential/Platelet  .  Comprehensive metabolic panel  . QuantiFERON-TB Gold Plus  . Vitamin B12  . Ambulatory referral to Psychology    Referral Priority:   Routine    Referral Type:   Psychiatric    Referral Reason:   Specialty Services Required    Requested Specialty:   Psychology    Number of Visits Requested:   1   Meds ordered this encounter  Medications  . enalapril (VASOTEC) 20 MG tablet    Sig: Take 1 tablet (20 mg total) by mouth daily.    Dispense:  90 tablet    Refill:  1  . amLODipine (NORVASC) 2.5 MG tablet    Sig: Take 1 tablet (2.5 mg total) by mouth daily.    Dispense:  90 tablet    Refill:  1  . tiZANidine (ZANAFLEX) 4 MG capsule    Sig: Take 1 capsule (4 mg total) by mouth 3 (three) times daily as needed for muscle spasms.    Dispense:  90 capsule    Refill:  5  . gabapentin (NEURONTIN) 300 MG capsule    Sig: Take 3 capsules (900 mg total) by mouth 3 (three) times daily.    Dispense:  270 capsule    Refill:  5  . meloxicam (MOBIC) 15 MG tablet    Sig: Take 1 tablet (15 mg total) by mouth daily as needed for pain.    Dispense:  90 tablet    Refill:  1  . traZODone (DESYREL) 100 MG tablet    Sig: Take 1 tablet (100 mg total) by mouth at bedtime.    Dispense:  90 tablet    Refill:  1    Return in about 3 months (around 03/31/2018) for follow-up chronic medical conditions DR. ZOE STALLINGS.   Justin Dunn, M.D. Primary Care at [facility] [address] [place], [place]  [address] [phone] phone [phone] fax

## 2017-12-29 NOTE — Patient Instructions (Addendum)
Start amlodipine 2.5mg  daily. Add Tizanidine 4mg  three times daily for muscle spasms.    IF you received an x-ray today, you will receive an invoice from [facility]. Please contact [facility] at [phone] with questions or concerns regarding your invoice.   IF you received labwork today, you will receive an invoice from [place]. Please contact [facility] at [phone] with questions or concerns regarding your invoice.   Our billing staff will not be able to assist you with questions regarding bills from these companies.  You will be contacted with the lab results as soon as they are available. The fastest way to get your results is to activate your My Chart account. Instructions are located on the last page of this paperwork. If you have not heard from [place] regarding the results in 2 weeks, please contact this office.      Cervical Strain and Sprain Rehab Ask your health care provider which exercises are safe for you. Do exercises exactly as told by your health care provider and adjust them as directed. It is normal to feel mild stretching, pulling, tightness, or discomfort as you do these exercises, but you should stop right away if you feel sudden pain or your pain gets worse.Do not begin these exercises until told by your health care provider. Stretching and range of motion exercises These exercises warm up your muscles and joints and improve the movement and flexibility of your neck. These exercises also help to relieve pain, numbness, and tingling. Exercise A: Cervical side bend  1. Using good posture, sit on a stable chair or stand up. 2. Without moving your shoulders, slowly tilt your left / right ear to your shoulder until you feel a stretch in your neck muscles. You should be looking straight ahead. 3. Hold for __________ seconds. 4. Repeat with the other side of your neck. Repeat __________ times. Complete this exercise __________ times a day. Exercise B:  Cervical rotation  1. Using good posture, sit on a stable chair or stand up. 2. Slowly turn your head to the side as if you are looking over your left / right shoulder. ? Keep your eyes level with the ground. ? Stop when you feel a stretch along the side and the back of your neck. 3. Hold for __________ seconds. 4. Repeat this by turning to your other side. Repeat __________ times. Complete this exercise __________ times a day. Exercise C: Thoracic extension and pectoral stretch 1. Roll a towel or a small blanket so it is about 4 inches (10 cm) in diameter. 2. Lie down on your back on a firm surface. 3. Put the towel lengthwise, under your spine in the middle of your back. It should not be not under your shoulder blades. The towel should line up with your spine from your middle back to your lower back. 4. Put your hands behind your head and let your elbows fall out to your sides. 5. Hold for __________ seconds. Repeat __________ times. Complete this exercise __________ times a day. Strengthening exercises These exercises build strength and endurance in your neck. Endurance is the ability to use your muscles for a long time, even after your muscles get tired. Exercise D: Upper cervical flexion, isometric 1. Lie on your back with a thin pillow behind your head and a small rolled-up towel under your neck. 2. Gently tuck your chin toward your chest and nod your head down to look toward your feet. Do not lift your head off the pillow. 3.  Hold for __________ seconds. 4. Release the tension slowly. Relax your neck muscles completely before you repeat this exercise. Repeat __________ times. Complete this exercise __________ times a day. Exercise E: Cervical extension, isometric  1. Stand about 6 inches (15 cm) away from a wall, with your back facing the wall. 2. Place a soft object, about 6-8 inches (15-20 cm) in diameter, between the back of your head and the wall. A soft object could be a small  pillow, a ball, or a folded towel. 3. Gently tilt your head back and press into the soft object. Keep your jaw and forehead relaxed. 4. Hold for __________ seconds. 5. Release the tension slowly. Relax your neck muscles completely before you repeat this exercise. Repeat __________ times. Complete this exercise __________ times a day. Posture and body mechanics  Body mechanics refers to the movements and positions of your body while you do your daily activities. Posture is part of body mechanics. Good posture and healthy body mechanics can help to relieve stress in your body's tissues and joints. Good posture means that your spine is in its natural S-curve position (your spine is neutral), your shoulders are pulled back slightly, and your head is not tipped forward. The following are general guidelines for applying improved posture and body mechanics to your everyday activities. Standing  When standing, keep your spine neutral and keep your feet about hip-width apart. Keep a slight bend in your knees. Your ears, shoulders, and hips should line up.  When you do a task in which you stand in one place for a long time, place one foot up on a stable object that is 2-4 inches (5-10 cm) high, such as a footstool. This helps keep your spine neutral. Sitting   When sitting, keep your spine neutral and your keep feet flat on the floor. Use a footrest, if necessary, and keep your thighs parallel to the floor. Avoid rounding your shoulders, and avoid tilting your head forward.  When working at a desk or a computer, keep your desk at a height where your hands are slightly lower than your elbows. Slide your chair under your desk so you are close enough to maintain good posture.  When working at a computer, place your monitor at a height where you are looking straight ahead and you do not have to tilt your head forward or downward to look at the screen. Resting When lying down and resting, avoid positions that  are most painful for you. Try to support your neck in a neutral position. You can use a contour pillow or a small rolled-up towel. Your pillow should support your neck but not push on it. This information is not intended to replace advice given to you by your health care provider. Make sure you discuss any questions you have with your health care provider. Document Released: 08/17/2005 Document Revised: 04/23/2016 Document Reviewed: 07/24/2015 Elsevier Interactive Patient Education  [employer].

## 2017-12-30 ENCOUNTER — Ambulatory Visit (INDEPENDENT_AMBULATORY_CARE_PROVIDER_SITE_OTHER): Payer: Medicare HMO

## 2017-12-30 DIAGNOSIS — M542 Cervicalgia: Secondary | ICD-10-CM | POA: Diagnosis not present

## 2017-12-30 LAB — VITAMIN B12: VITAMIN B 12: 563 pg/mL (ref 232–1245)

## 2018-01-02 LAB — CBC WITH DIFFERENTIAL/PLATELET
BASOS ABS: 0 10*3/uL (ref 0.0–0.2)
BASOS: 0 %
EOS (ABSOLUTE): 0.1 10*3/uL (ref 0.0–0.4)
Eos: 1 %
HEMOGLOBIN: 15.6 g/dL (ref 13.0–17.7)
Hematocrit: 45.8 % (ref 37.5–51.0)
IMMATURE GRANS (ABS): 0 10*3/uL (ref 0.0–0.1)
Immature Granulocytes: 0 %
LYMPHS ABS: 3 10*3/uL (ref 0.7–3.1)
LYMPHS: 43 %
MCH: 32.2 pg (ref 26.6–33.0)
MCHC: 34.1 g/dL (ref 31.5–35.7)
MCV: 94 fL (ref 79–97)
Monocytes Absolute: 0.5 10*3/uL (ref 0.1–0.9)
Monocytes: 7 %
NEUTROS ABS: 3.3 10*3/uL (ref 1.4–7.0)
Neutrophils: 49 %
Platelets: 263 10*3/uL (ref 150–379)
RBC: 4.85 x10E6/uL (ref 4.14–5.80)
RDW: 14.1 % (ref 12.3–15.4)
WBC: 6.9 10*3/uL (ref 3.4–10.8)

## 2018-01-02 LAB — COMPREHENSIVE METABOLIC PANEL
A/G RATIO: 1.8 (ref 1.2–2.2)
ALBUMIN: 4.7 g/dL (ref 3.5–5.5)
ALK PHOS: 86 IU/L (ref 39–117)
ALT: 24 IU/L (ref 0–44)
AST: 29 IU/L (ref 0–40)
BILIRUBIN TOTAL: 0.3 mg/dL (ref 0.0–1.2)
BUN / CREAT RATIO: 15 (ref 9–20)
BUN: 16 mg/dL (ref 6–24)
CHLORIDE: 101 mmol/L (ref 96–106)
CO2: 20 mmol/L (ref 20–29)
Calcium: 9.4 mg/dL (ref 8.7–10.2)
Creatinine, Ser: 1.05 mg/dL (ref 0.76–1.27)
GFR calc non Af Amer: 78 mL/min/{1.73_m2} (ref >59)
GFR, EST AFRICAN AMERICAN: 90 mL/min/{1.73_m2} (ref >59)
GLOBULIN, TOTAL: 2.6 g/dL (ref 1.5–4.5)
GLUCOSE: 85 mg/dL (ref 65–99)
POTASSIUM: 4.1 mmol/L (ref 3.5–5.2)
SODIUM: 138 mmol/L (ref 134–144)
TOTAL PROTEIN: 7.3 g/dL (ref 6.0–8.5)

## 2018-01-02 LAB — QUANTIFERON-TB GOLD PLUS
QUANTIFERON TB1 AG VALUE: 0.04 [IU]/mL
QUANTIFERON-TB GOLD PLUS: NEGATIVE
QuantiFERON Mitogen Value: >10 IU/mL
QuantiFERON Nil Value: 0.01 IU/mL
QuantiFERON TB2 Ag Value: 0.06 IU/mL

## 2018-01-14 ENCOUNTER — Telehealth: Payer: Self-pay

## 2018-01-14 NOTE — Telephone Encounter (Signed)
Copied from [place] [phone]. Topic: Inquiry >> Jan 14, 2018  8:01 AM Pricilla Handler wrote: Reason for CRM: Patient called requesting his lab results from 12/29/2017. Please call the patient today at [phone].  Sent message to Dr. Nolon Rod - labs have not been released yet. CRM completed

## 2018-01-20 ENCOUNTER — Telehealth: Payer: Self-pay

## 2018-01-20 DIAGNOSIS — G8929 Other chronic pain: Secondary | ICD-10-CM | POA: Diagnosis not present

## 2018-01-20 DIAGNOSIS — M792 Neuralgia and neuritis, unspecified: Secondary | ICD-10-CM | POA: Diagnosis not present

## 2018-01-20 DIAGNOSIS — R32 Unspecified urinary incontinence: Secondary | ICD-10-CM | POA: Diagnosis not present

## 2018-01-20 DIAGNOSIS — J309 Allergic rhinitis, unspecified: Secondary | ICD-10-CM | POA: Diagnosis not present

## 2018-01-20 DIAGNOSIS — Z791 Long term (current) use of non-steroidal anti-inflammatories (NSAID): Secondary | ICD-10-CM | POA: Diagnosis not present

## 2018-01-20 DIAGNOSIS — N529 Male erectile dysfunction, unspecified: Secondary | ICD-10-CM | POA: Diagnosis not present

## 2018-01-20 DIAGNOSIS — I10 Essential (primary) hypertension: Secondary | ICD-10-CM | POA: Diagnosis not present

## 2018-01-20 DIAGNOSIS — G4700 Insomnia, unspecified: Secondary | ICD-10-CM | POA: Diagnosis not present

## 2018-01-20 DIAGNOSIS — E669 Obesity, unspecified: Secondary | ICD-10-CM | POA: Diagnosis not present

## 2018-01-20 DIAGNOSIS — Z6831 Body mass index (BMI) 31.0-31.9, adult: Secondary | ICD-10-CM | POA: Diagnosis not present

## 2018-01-20 NOTE — Telephone Encounter (Signed)
Please advise on labs from 5/1

## 2018-01-24 ENCOUNTER — Encounter: Payer: Self-pay | Admitting: Family Medicine

## 2018-01-25 ENCOUNTER — Encounter: Payer: Self-pay | Admitting: *Deleted

## 2018-01-25 NOTE — Telephone Encounter (Signed)
Patient advised Voiced understanding  Patient stated atena came out to his house took his blood pressure and it was 135/85 (he thought)  Patient was advised he could take it at [facility] and call us back when he gets his reading.     Patient was also advised that he could by a device to check it at home.      He will call back when he gets his reading but states he has been under a lot of stress so that could be why.

## 2018-01-25 NOTE — Telephone Encounter (Signed)
Call --- labs all normal.  B12 is normal.  Sugar is normal. Liver and kidney functions are normal.  No evidence of anemia. No evidence of tuberculosis.

## 2018-03-08 ENCOUNTER — Other Ambulatory Visit: Payer: Self-pay | Admitting: Family Medicine

## 2018-03-08 MED ORDER — ZOLPIDEM TARTRATE 10 MG PO TABS: ORAL_TABLET | ORAL | 1 refills | Status: DC

## 2018-03-15 ENCOUNTER — Ambulatory Visit: Payer: Medicare HMO | Admitting: Psychology

## 2018-03-19 MED ORDER — MELOXICAM 15 MG PO TABS: 15.0000 mg | ORAL_TABLET | Freq: Every day | ORAL | 1 refills | Status: DC | PRN

## 2018-03-19 MED ORDER — TRAZODONE HCL 100 MG PO TABS: 100.0000 mg | ORAL_TABLET | Freq: Every day | ORAL | 1 refills | Status: DC

## 2018-03-19 MED ORDER — GABAPENTIN 300 MG PO CAPS: 900.0000 mg | ORAL_CAPSULE | Freq: Three times a day (TID) | ORAL | 5 refills | Status: DC

## 2018-03-30 ENCOUNTER — Ambulatory Visit (INDEPENDENT_AMBULATORY_CARE_PROVIDER_SITE_OTHER): Payer: Medicare HMO | Admitting: Psychology

## 2018-03-30 DIAGNOSIS — R69 Illness, unspecified: Secondary | ICD-10-CM | POA: Diagnosis not present

## 2018-03-30 DIAGNOSIS — F418 Other specified anxiety disorders: Secondary | ICD-10-CM | POA: Diagnosis not present

## 2018-04-03 ENCOUNTER — Encounter: Payer: Self-pay | Admitting: Family Medicine

## 2018-04-04 DIAGNOSIS — C61 Malignant neoplasm of prostate: Secondary | ICD-10-CM | POA: Diagnosis not present

## 2018-04-06 ENCOUNTER — Encounter: Payer: Self-pay | Admitting: Family Medicine

## 2018-04-06 ENCOUNTER — Ambulatory Visit (INDEPENDENT_AMBULATORY_CARE_PROVIDER_SITE_OTHER): Payer: Medicare HMO | Admitting: Family Medicine

## 2018-04-06 ENCOUNTER — Other Ambulatory Visit: Payer: Self-pay

## 2018-04-06 VITALS — BP 177/96 | HR 72 | Temp 98.7°F | Resp 17 | Ht 70.87 in | Wt 221.4 lb

## 2018-04-06 DIAGNOSIS — F4323 Adjustment disorder with mixed anxiety and depressed mood: Secondary | ICD-10-CM

## 2018-04-06 DIAGNOSIS — R5382 Chronic fatigue, unspecified: Secondary | ICD-10-CM

## 2018-04-06 DIAGNOSIS — M5136 Other intervertebral disc degeneration, lumbar region: Secondary | ICD-10-CM | POA: Diagnosis not present

## 2018-04-06 DIAGNOSIS — M5030 Other cervical disc degeneration, unspecified cervical region: Secondary | ICD-10-CM

## 2018-04-06 DIAGNOSIS — M542 Cervicalgia: Secondary | ICD-10-CM

## 2018-04-06 DIAGNOSIS — I10 Essential (primary) hypertension: Secondary | ICD-10-CM

## 2018-04-06 DIAGNOSIS — R69 Illness, unspecified: Secondary | ICD-10-CM | POA: Diagnosis not present

## 2018-04-06 MED ORDER — GABAPENTIN 300 MG PO CAPS: 900.0000 mg | ORAL_CAPSULE | Freq: Three times a day (TID) | ORAL | 5 refills | Status: DC

## 2018-04-06 MED ORDER — AMLODIPINE BESYLATE 5 MG PO TABS: 5.0000 mg | ORAL_TABLET | Freq: Every day | ORAL | 0 refills | Status: DC

## 2018-04-06 MED ORDER — TIZANIDINE HCL 4 MG PO CAPS: 4.0000 mg | ORAL_CAPSULE | Freq: Three times a day (TID) | ORAL | 5 refills | Status: DC | PRN

## 2018-04-06 NOTE — Progress Notes (Signed)
Chief Complaint  Patient presents with  . neck pain/htn    3 month f/u  . Medication Refill    gabapentin and tizanidine    HPI Former patient of Dr. Tamala Julian  Chronic Pain and Fatigue He has chronic pain from multiple back surgeries He reports that he was increased on trazodone and gabapentin and tizanidine He state that he has six screws and rods in his back He states that he just feels so tired He could sleep all night and still feel heavy and tired His pain is improved  Hypertension Pt reports that he has been compliant with his bp meds but feels like his bp just keeps worsening He denies chest pains, headaches or palpitations He reports that he has been hoping to improve his bp with natural herbs He is taking amlodipine 2.5mg  and vasotec 20mg   BP Readings from Last 3 Encounters:  04/06/18 (!) 177/96  12/29/17 (!) 150/82  09/20/17 (!) 148/82   Emotional Stress Depression screen PHQ 2/9 04/06/2018 12/29/2017 09/20/2017 09/09/2017 05/12/2017  Decreased Interest 0 0 0 0 0  Down, Depressed, Hopeless 0 0 0 0 0  PHQ - 2 Score 0 0 0 0 0  Altered sleeping - - - - -  Tired, decreased energy - - - - -  Change in appetite - - - - -  Feeling bad or failure about yourself  - - - - -  Trouble concentrating - - - - -  Moving slowly or fidgety/restless - - - - -  Suicidal thoughts - - - - -  PHQ-9 Score - - - - -  Difficult doing work/chores - - - - -    He will be going to Dr. Ceasar Lund at [facility] for counseling He reports that he will be going in for counseling He states that he has family back home [place] that he has to care for. 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  Body mass index is 30.99 kg/m.  Wt Readings from Last 3 Encounters:  04/06/18 221 lb 6.4 oz (100.4 kg)  12/29/17 217 lb (98.4 kg)  09/20/17 229 lb (103.9 kg)     Past  Medical History:  Diagnosis Date  . Anxiety   . At risk for sleep apnea    STOP-BANG= 4   SENT TO PCP 04-02-2014  . Chronic low back pain   . Depression   . ED (erectile dysfunction)   . History of colon polyps   . History of gastric ulcer    as child  . Hypertension   . Hypogonadism male   . Incomplete bladder emptying   . Lumbar spine scoliosis   . Premature ejaculation   . Prostate cancer [facility])    Dr Tammi Klippel ( oncologist) and Dr Loel [facility])   . Retention of urine, unspecified   . Sleep apnea    states never told to wear CPAP  . Wears glasses     Current Outpatient Medications  Medication Sig Dispense Refill  . amLODipine (NORVASC) 5 MG tablet Take 1 tablet (5 mg total) by mouth daily. 90 tablet 0  . b complex vitamins tablet Take 1 tablet by mouth daily.    . Calcium-Cholecalciferol (VITA-CALCIUM PO) Take by mouth.    . Cholecalciferol (VITAMIN D3) 1000 units CAPS Take by mouth.    Marland Kitchen  enalapril (VASOTEC) 20 MG tablet Take 1 tablet (20 mg total) by mouth daily. 90 tablet 1  . fluticasone (FLONASE) 50 MCG/ACT nasal spray Place 2 sprays into both nostrils daily. 16 g 11  . gabapentin (NEURONTIN) 300 MG capsule Take 3 capsules (900 mg total) by mouth 3 (three) times daily. 270 capsule 5  . meloxicam (MOBIC) 15 MG tablet Take 1 tablet (15 mg total) by mouth daily as needed for pain. 90 tablet 1  . Multiple Vitamins-Minerals-FA (VITATAB MV PO) Take by mouth.    . Olopatadine HCl (PATADAY) 0.2 % SOLN Apply 1 drop to eye daily. 2.5 mL 3  . saw palmetto 160 MG capsule Take 160 mg by mouth 2 (two) times daily.    Marland Kitchen tiZANidine (ZANAFLEX) 4 MG capsule Take 1 capsule (4 mg total) by mouth 3 (three) times daily as needed for muscle spasms. 90 capsule 5  . traZODone (DESYREL) 100 MG tablet Take 1 tablet (100 mg total) by mouth at bedtime. 90 tablet 1  . triamcinolone ointment (KENALOG) 0.5 % Apply 1 application topically 2 (two) times daily. 30 g 0  . Turmeric (RA TURMERIC)  500 MG CAPS Take 1 tablet by mouth. Reported on 08/19/2015    . zolpidem (AMBIEN) 10 MG tablet take 1 tablet by mouth at bedtime if needed for sleep 30 tablet 1   No current facility-administered medications for this visit.     Allergies: No Known Allergies  Past Surgical History:  Procedure Laterality Date  . APPENDECTOMY  age 20  . COLONOSCOPY W/ POLYPECTOMY  july 2015  . LUMBAR FUSION  04/ 2012   and rod  . PROSTATE BIOPSY    . RADIOACTIVE SEED IMPLANT N/A 04/06/2014   Procedure: RADIOACTIVE SEED IMPLANT;  Surgeon: [name], MD;  Location: [facility];  Service: Urology;  Laterality: N/A;  dr portable     Social History   Socioeconomic History  . Marital status: Divorced    Spouse name: Not on file  . Number of children: Not on file  . Years of education: Not on file  . Highest education level: Some college, no degree  Occupational History  . Occupation: disability    Comment: DDD lumbar spine; 2013  Social Needs  . Financial resource strain: Hard  . Food insecurity:    Worry: Often true    Inability: Often true  . Transportation needs:    Medical: No    Non-medical: No  Tobacco Use  . Smoking status: Current Some Day Smoker    Packs/day: 1.00    Years: 10.00    Pack years: 10.00    Types: Cigarettes, Cigars  . Smokeless tobacco: Never Used  . Tobacco comment: currently smokes 2 small cigar per day/  quit cigarettes 2011  Substance and Sexual Activity  . Alcohol use: Yes    Alcohol/week: 0.0 oz    Comment: 1-2 glasses of wine daily  . Drug use: Yes    Types: Marijuana  . Sexual activity: Never    Birth control/protection: Abstinence  Lifestyle  . Physical activity:    Days per week: 2 days    Minutes per session: 60 min  . Stress: Rather much  Relationships  . Social connections:    Talks on phone: More than three times a week    Gets together: More than three times a week    Attends religious service: More than 4 times per year     Active member of club  or organization: No    Attends meetings of clubs or organizations: Never    Relationship status: Divorced  Other Topics Concern  . Not on file  Social History Narrative   Marital status: divorced; dating in 2019      Children: one foster son (autistic_      Lives: with foster son      Employment: disability from DDD lumbar      Tobacco: none      Alcohol: none      Drugs:      Exercise: minimal in 2019.      ADLs; drives; independent with ADLs.      Advanced Directives: none; FULL CODE.  Johnsie Cancel [phone] [employer] [phone].    Family History  Problem Relation Age of Onset  . Prostate cancer Father   . Hypertension Mother   . Pulmonary fibrosis Mother   . Arthritis Mother   . Diabetes Mother   . Deep vein thrombosis Sister      ROS Review of Systems See HPI Constitution: No fevers or chills No malaise No diaphoresis Skin: No rash or itching Eyes: no blurry vision, no double vision GU: no dysuria or hematuria Neuro: no dizziness or headaches all others reviewed and negative   Objective: Vitals:   04/06/18 1139  BP: (!) 177/96  Pulse: 72  Resp: 17  Temp: 98.7 F (37.1 C)  TempSrc: Oral  SpO2: 99%  Weight: 221 lb 6.4 oz (100.4 kg)  Height: 5' 10.87" (1.8 m)    Physical Exam  Constitutional: He is oriented to person, place, and time. He appears well-developed and well-nourished.  HENT:  Head: Normocephalic and atraumatic.  Mouth/Throat: Oropharynx is clear and moist.  Eyes: Conjunctivae and EOM are normal.  Neck: Normal range of motion. No thyromegaly present.  Cardiovascular: Normal rate, regular rhythm and normal heart sounds.  No murmur heard. Pulmonary/Chest: Effort normal and breath sounds normal. No stridor. No respiratory distress. He has no wheezes.  Neurological: He is alert and oriented to person, place, and time.  Skin: Skin is warm. Capillary refill takes less than 2 seconds.  Psychiatric: He has  a normal mood and affect. His behavior is normal. Judgment and thought content normal.    Assessment and Plan Milferd was seen today for neck pain/htn and medication refill.  Diagnoses and all orders for this visit:  Essential (primary) hypertension -   Discussed increasing amlodipine -   Continue DASH diet -  Continue exercise  Neck pain Degenerative disc disease, cervical Degenerative disc disease, lumbar -  Currently pt pain is controlled with gabapentin, tizanidine but he seems to have worsening fatigue Discussed that opiates would be just as bad but have the risk of dependence and opiate induced constipation Continue current pain regimen  Adjustment disorder with mixed anxiety and depressed mood -  Follow up with Psychology   Chronic fatigue -   Reviewed labs, pt with meds that cause fatigue - trazodone, tizanidine and gabpentin Will monitor his bp to ensure overcorrection does not occur as that will worsening his fatigue   Other orders -     tiZANidine (ZANAFLEX) 4 MG capsule; Take 1 capsule (4 mg total) by mouth 3 (three) times daily as needed for muscle spasms. -     gabapentin (NEURONTIN) 300 MG capsule; Take 3 capsules (900 mg total) by mouth 3 (three) times daily. -     amLODipine (NORVASC) 5 MG tablet; Take 1 tablet (5 mg total) by mouth daily.  [place]

## 2018-04-06 NOTE — Patient Instructions (Addendum)
IF you received an x-ray today, you will receive an invoice from [facility]. Please contact [facility] at [phone] with questions or concerns regarding your invoice.   IF you received labwork today, you will receive an invoice from [place]. Please contact [facility] at [phone] with questions or concerns regarding your invoice.   Our billing staff will not be able to assist you with questions regarding bills from these companies.  You will be contacted with the lab results as soon as they are available. The fastest way to get your results is to activate your My Chart account. Instructions are located on the last page of this paperwork. If you have not heard from [place] regarding the results in 2 weeks, please contact this office.     DASH Eating Plan DASH stands for "Dietary Approaches to Stop Hypertension." The DASH eating plan is a healthy eating plan that has been shown to reduce high blood pressure (hypertension). It may also reduce your risk for type 2 diabetes, heart disease, and stroke. The DASH eating plan may also help with weight loss. What are tips for following this plan? General guidelines  Avoid eating more than 2,300 mg (milligrams) of salt (sodium) a day. If you have hypertension, you may need to reduce your sodium intake to 1,500 mg a day.  Limit alcohol intake to no more than 1 drink a day for nonpregnant women and 2 drinks a day for men. One drink equals 12 oz of beer, 5 oz of wine, or 1 oz of hard liquor.  Work with your health care provider to maintain a healthy body weight or to lose weight. Ask what an ideal weight is for you.  Get at least 30 minutes of exercise that causes your heart to beat faster (aerobic exercise) most days of the week. Activities may include walking, swimming, or biking.  Work with your health care provider or diet and nutrition specialist (dietitian) to adjust your eating plan to your individual calorie  needs. Reading food labels  Check food labels for the amount of sodium per serving. Choose foods with less than 5 percent of the Daily Value of sodium. Generally, foods with less than 300 mg of sodium per serving fit into this eating plan.  To find whole grains, look for the word "whole" as the first word in the ingredient list. Shopping  Buy products labeled as "low-sodium" or "no salt added."  Buy fresh foods. Avoid canned foods and premade or frozen meals. Cooking  Avoid adding salt when cooking. Use salt-free seasonings or herbs instead of table salt or sea salt. Check with your health care provider or pharmacist before using salt substitutes.  Do not fry foods. Cook foods using healthy methods such as baking, boiling, grilling, and broiling instead.  Cook with heart-healthy oils, such as olive, canola, soybean, or sunflower oil. Meal planning   Eat a balanced diet that includes: ? 5 or more servings of fruits and vegetables each day. At each meal, try to fill half of your plate with fruits and vegetables. ? Up to 6-8 servings of whole grains each day. ? Less than 6 oz of lean meat, poultry, or fish each day. A 3-oz serving of meat is about the same size as a deck of cards. One egg equals 1 oz. ? 2 servings of low-fat dairy each day. ? A serving of nuts, seeds, or beans 5 times each week. ? Heart-healthy fats. Healthy fats called Omega-3 fatty acids are found  in foods such as flaxseeds and coldwater fish, like sardines, salmon, and mackerel.  Limit how much you eat of the following: ? Canned or prepackaged foods. ? Food that is high in trans fat, such as fried foods. ? Food that is high in saturated fat, such as fatty meat. ? Sweets, desserts, sugary drinks, and other foods with added sugar. ? Full-fat dairy products.  Do not salt foods before eating.  Try to eat at least 2 vegetarian meals each week.  Eat more home-cooked food and less restaurant, buffet, and fast  food.  When eating at a restaurant, ask that your food be prepared with less salt or no salt, if possible. What foods are recommended? The items listed may not be a complete list. Talk with your dietitian about what dietary choices are best for you. Grains Whole-grain or whole-wheat bread. Whole-grain or whole-wheat pasta. Brown rice. [place]. Bulgur. Whole-grain and low-sodium cereals. Pita bread. Low-fat, low-sodium crackers. Whole-wheat flour tortillas. Vegetables Fresh or frozen vegetables (raw, steamed, roasted, or grilled). Low-sodium or reduced-sodium tomato and vegetable juice. Low-sodium or reduced-sodium tomato sauce and tomato paste. Low-sodium or reduced-sodium canned vegetables. Fruits All fresh, dried, or frozen fruit. Canned fruit in natural juice (without added sugar). Meat and other protein foods Skinless chicken or [place]. Ground chicken or [place]. Pork with fat trimmed off. Fish and seafood. Egg whites. Dried beans, peas, or lentils. Unsalted nuts, nut butters, and seeds. Unsalted canned beans. Lean cuts of beef with fat trimmed off. Low-sodium, lean deli meat. Dairy Low-fat (1%) or fat-free (skim) milk. Fat-free, low-fat, or reduced-fat cheeses. Nonfat, low-sodium ricotta or cottage cheese. Low-fat or nonfat yogurt. Low-fat, low-sodium cheese. Fats and oils Soft margarine without trans fats. Vegetable oil. Low-fat, reduced-fat, or light mayonnaise and salad dressings (reduced-sodium). Canola, safflower, olive, soybean, and sunflower oils. Avocado. Seasoning and other foods Herbs. Spices. Seasoning mixes without salt. Unsalted popcorn and pretzels. Fat-free sweets. What foods are not recommended? The items listed may not be a complete list. Talk with your dietitian about what dietary choices are best for you. Grains Baked goods made with fat, such as croissants, muffins, or some breads. Dry pasta or rice meal packs. Vegetables Creamed or fried vegetables. Vegetables  in a cheese sauce. Regular canned vegetables (not low-sodium or reduced-sodium). Regular canned tomato sauce and paste (not low-sodium or reduced-sodium). Regular tomato and vegetable juice (not low-sodium or reduced-sodium). Angie Fava. Olives. Fruits Canned fruit in a light or heavy syrup. Fried fruit. Fruit in cream or butter sauce. Meat and other protein foods Fatty cuts of meat. Ribs. Fried meat. Berniece Salines. Sausage. Bologna and other processed lunch meats. Salami. Fatback. Hotdogs. Bratwurst. Salted nuts and seeds. Canned beans with added salt. Canned or smoked fish. Whole eggs or egg yolks. Chicken or [place] with skin. Dairy Whole or 2% milk, cream, and half-and-half. Whole or full-fat cream cheese. Whole-fat or sweetened yogurt. Full-fat cheese. Nondairy creamers. Whipped toppings. Processed cheese and cheese spreads. Fats and oils Butter. Stick margarine. Lard. Shortening. Ghee. Bacon fat. Tropical oils, such as coconut, palm kernel, or palm oil. Seasoning and other foods Salted popcorn and pretzels. Onion salt, garlic salt, seasoned salt, table salt, and sea salt. Worcestershire sauce. Tartar sauce. Barbecue sauce. Teriyaki sauce. Soy sauce, including reduced-sodium. Steak sauce. Canned and packaged gravies. Fish sauce. Oyster sauce. Cocktail sauce. Horseradish that you find on the shelf. Ketchup. Mustard. Meat flavorings and tenderizers. Bouillon cubes. Hot sauce and Tabasco sauce. Premade or packaged marinades. Premade or packaged taco seasonings. Relishes.  Regular salad dressings. Where to find more information:  National Heart, Lung, and [place]: https://wilson-eaton.com/  American Heart Association: www.heart.org Summary  The DASH eating plan is a healthy eating plan that has been shown to reduce high blood pressure (hypertension). It may also reduce your risk for type 2 diabetes, heart disease, and stroke.  With the DASH eating plan, you should limit salt (sodium) intake to 2,300 mg a  day. If you have hypertension, you may need to reduce your sodium intake to 1,500 mg a day.  When on the DASH eating plan, aim to eat more fresh fruits and vegetables, whole grains, lean proteins, low-fat dairy, and heart-healthy fats.  Work with your health care provider or diet and nutrition specialist (dietitian) to adjust your eating plan to your individual calorie needs. This information is not intended to replace advice given to you by your health care provider. Make sure you discuss any questions you have with your health care provider. Document Released: 08/06/2011 Document Revised: 08/10/2016 Document Reviewed: 08/10/2016 Elsevier Interactive Patient Education  [employer].

## 2018-04-14 ENCOUNTER — Telehealth: Payer: Self-pay | Admitting: Family Medicine

## 2018-04-14 NOTE — Telephone Encounter (Signed)
Left a voicemail in regards to his appt on 05/11/2018. The templates have changed in Epic and he will need to be rescheduled.

## 2018-04-27 ENCOUNTER — Ambulatory Visit: Payer: Medicare HMO | Admitting: Psychology

## 2018-05-04 NOTE — Progress Notes (Signed)
Chief Complaint  Patient presents with  . Blood Pressure Check    one month recheck  . Medication Refill    ambien, norvasc    HPI   Essential Hypertension  Pt reports that he does not check his blood pressure at home He states that he does not understand why he his blood pressure is still high He denies any chest pain, sob or swelling in the legs His father had heart disease and required a pacemaker He sticks to a low sodium diet BP Readings from Last 3 Encounters:  05/10/18 130/70  04/06/18 (!) 177/96  12/29/17 (!) 150/82   Lab Results  Component Value Date   CREATININE 1.05 12/29/2017   GERD- choking sensation He reports that at night he gets the sensation like he is choking with food He can taste the acidity in the mouth after he gags He can sometimes feel this happen and it wakes him due to the choking cessation He eats dinner sometimes late in the evening He states that he might eat a sandwich at night He normally stays up at least an hour after eating  Tobacco use Pt smokes cigars He wants to quit smoking and plans to get his mind right He states that he knows he can do it but will come back for medicines if he cannot quit by himself  Past Medical History:  Diagnosis Date  . Anxiety   . At risk for sleep apnea    STOP-BANG= 4   SENT TO PCP 04-02-2014  . Chronic low back pain   . Depression   . ED (erectile dysfunction)   . History of colon polyps   . History of gastric ulcer    as child  . Hypertension   . Hypogonadism male   . Incomplete bladder emptying   . Lumbar spine scoliosis   . Premature ejaculation   . Prostate cancer [facility])    Dr Tammi Klippel ( oncologist) and Dr Loel Lofty [facility])   . Retention of urine, unspecified   . Sleep apnea    states never told to wear CPAP  . Wears glasses     Current Outpatient Medications  Medication Sig Dispense Refill  . amLODipine (NORVASC) 10 MG tablet Take 1 tablet (10 mg total) by mouth daily. 90  tablet 1  . b complex vitamins tablet Take 1 tablet by mouth daily.    . Calcium-Cholecalciferol (VITA-CALCIUM PO) Take by mouth.    . Cholecalciferol (VITAMIN D3) 1000 units CAPS Take by mouth.    . enalapril (VASOTEC) 20 MG tablet Take 1 tablet (20 mg total) by mouth daily. 90 tablet 1  . fluticasone (FLONASE) 50 MCG/ACT nasal spray Place 2 sprays into both nostrils daily. 16 g 11  . gabapentin (NEURONTIN) 300 MG capsule Take 3 capsules (900 mg total) by mouth 3 (three) times daily. 270 capsule 5  . meloxicam (MOBIC) 15 MG tablet Take 1 tablet (15 mg total) by mouth daily as needed for pain. 90 tablet 1  . Multiple Vitamins-Minerals-FA (VITATAB MV PO) Take by mouth.    . Olopatadine HCl (PATADAY) 0.2 % SOLN Apply 1 drop to eye daily. 2.5 mL 3  . saw palmetto 160 MG capsule Take 160 mg by mouth 2 (two) times daily.    Marland Kitchen tiZANidine (ZANAFLEX) 4 MG capsule Take 1 capsule (4 mg total) by mouth 3 (three) times daily as needed for muscle spasms. 90 capsule 5  . traZODone (DESYREL) 100 MG tablet Take 1 tablet (  100 mg total) by mouth at bedtime. 90 tablet 1  . triamcinolone ointment (KENALOG) 0.5 % Apply 1 application topically 2 (two) times daily. 30 g 0  . Turmeric (RA TURMERIC) 500 MG CAPS Take 1 tablet by mouth. Reported on 08/19/2015    . zolpidem (AMBIEN) 10 MG tablet take 1 tablet by mouth at bedtime if needed for sleep 30 tablet 1   No current facility-administered medications for this visit.     Allergies: No Known Allergies  Past Surgical History:  Procedure Laterality Date  . APPENDECTOMY  age 19  . COLONOSCOPY W/ POLYPECTOMY  july 2015  . LUMBAR FUSION  04/ 2012   and rod  . PROSTATE BIOPSY    . RADIOACTIVE SEED IMPLANT N/A 04/06/2014   Procedure: RADIOACTIVE SEED IMPLANT;  Surgeon: [name], MD;  Location: [facility];  Service: Urology;  Laterality: N/A;  dr portable     Social History   Socioeconomic History  . Marital status: Divorced    Spouse name:  Not on file  . Number of children: Not on file  . Years of education: Not on file  . Highest education level: Some college, no degree  Occupational History  . Occupation: disability    Comment: DDD lumbar spine; 2013  Social Needs  . Financial resource strain: Hard  . Food insecurity:    Worry: Often true    Inability: Often true  . Transportation needs:    Medical: No    Non-medical: No  Tobacco Use  . Smoking status: Current Some Day Smoker    Packs/day: 1.00    Years: 10.00    Pack years: 10.00    Types: Cigarettes, Cigars  . Smokeless tobacco: Never Used  . Tobacco comment: currently smokes 2 small cigar per day/  quit cigarettes 2011  Substance and Sexual Activity  . Alcohol use: Yes    Alcohol/week: 0.0 standard drinks    Comment: 1-2 glasses of wine daily  . Drug use: Yes    Types: Marijuana  . Sexual activity: Never    Birth control/protection: Abstinence  Lifestyle  . Physical activity:    Days per week: 2 days    Minutes per session: 60 min  . Stress: Rather much  Relationships  . Social connections:    Talks on phone: More than three times a week    Gets together: More than three times a week    Attends religious service: More than 4 times per year    Active member of club or organization: No    Attends meetings of clubs or organizations: Never    Relationship status: Divorced  Other Topics Concern  . Not on file  Social History Narrative   Marital status: divorced; dating in 2019      Children: one foster son (autistic_      Lives: with foster son      Employment: disability from DDD lumbar      Tobacco: none      Alcohol: none      Drugs:      Exercise: minimal in 2019.      ADLs; drives; independent with ADLs.      Advanced Directives: none; FULL CODE.  Johnsie Cancel Sevcik/[phone] [employer] [phone].    Family History  Problem Relation Age of Onset  . Prostate cancer Father   . Hypertension Mother   . Pulmonary fibrosis  Mother   . Arthritis Mother   . Diabetes Mother   .  Deep vein thrombosis Sister      ROS Review of Systems See HPI Constitution: No fevers or chills No malaise No diaphoresis Skin: No rash or itching Eyes: no blurry vision, no double vision GU: no dysuria or hematuria Neuro: no dizziness or headaches  all others reviewed and negative   Objective: Vitals:   05/10/18 1116 05/10/18 1201  BP: (!) 152/95 130/70  Pulse: 91   Resp: 17   Temp: 98.9 F (37.2 C)   TempSrc: Oral   SpO2: 98%   Weight: 217 lb 12.8 oz (98.8 kg)   Height: 5' 10.87" (1.8 m)     Physical Exam  Physical Exam  Constitutional: She is oriented to person, place, and time. She appears well-developed and well-nourished.  HENT:  Head: Normocephalic and atraumatic.  Eyes: Conjunctivae and EOM are normal.  Cardiovascular: Normal rate, regular rhythm and normal heart sounds.   Pulmonary/Chest: Effort normal and breath sounds normal. No respiratory distress. She has no wheezes.  Neurological: She is alert and oriented to person, place, and time.     Assessment and Plan Daiden was seen today for blood pressure check and medication refill.  Diagnoses and all orders for this visit:  Gastroesophageal reflux disease with esophagitis Choking sensation -     Ambulatory referral to Gastroenterology -   Discussed his symptoms of gerd Will refer to EGD  Degenerative disc disease, cervical- discussed his pain mgmt  Encounter for smoking cessation counseling- discussed tips for quitting Pt will set quit date for the end of the month  Essential Hypertension - bp improved with increased dose of amlodipine -     amLODipine (NORVASC) 10 MG tablet; Take 1 tablet (10 mg total) by mouth daily.     [place]

## 2018-05-10 ENCOUNTER — Encounter: Payer: Self-pay | Admitting: Family Medicine

## 2018-05-10 ENCOUNTER — Ambulatory Visit (INDEPENDENT_AMBULATORY_CARE_PROVIDER_SITE_OTHER): Payer: Medicare HMO | Admitting: Family Medicine

## 2018-05-10 ENCOUNTER — Other Ambulatory Visit: Payer: Self-pay

## 2018-05-10 VITALS — BP 130/70 | HR 91 | Temp 98.9°F | Resp 17 | Ht 70.87 in | Wt 217.8 lb

## 2018-05-10 DIAGNOSIS — K2100 Gastro-esophageal reflux disease with esophagitis, without bleeding: Secondary | ICD-10-CM

## 2018-05-10 DIAGNOSIS — M5030 Other cervical disc degeneration, unspecified cervical region: Secondary | ICD-10-CM | POA: Diagnosis not present

## 2018-05-10 DIAGNOSIS — R0989 Other specified symptoms and signs involving the circulatory and respiratory systems: Secondary | ICD-10-CM

## 2018-05-10 DIAGNOSIS — I10 Essential (primary) hypertension: Secondary | ICD-10-CM

## 2018-05-10 DIAGNOSIS — Z716 Tobacco abuse counseling: Secondary | ICD-10-CM

## 2018-05-10 MED ORDER — AMLODIPINE BESYLATE 10 MG PO TABS: 10.0000 mg | ORAL_TABLET | Freq: Every day | ORAL | 1 refills | Status: DC

## 2018-05-10 NOTE — Patient Instructions (Addendum)
If you have lab work done today you will be contacted with your lab results within the next 2 weeks.  If you have not heard from [place] then please contact us. The fastest way to get your results is to register for My Chart.   IF you received an x-ray today, you will receive an invoice from [facility]. Please contact [facility] at [phone] with questions or concerns regarding your invoice.   IF you received labwork today, you will receive an invoice from [place]. Please contact [facility] at [phone] with questions or concerns regarding your invoice.   Our billing staff will not be able to assist you with questions regarding bills from these companies.  You will be contacted with the lab results as soon as they are available. The fastest way to get your results is to activate your My Chart account. Instructions are located on the last page of this paperwork. If you have not heard from [place] regarding the results in 2 weeks, please contact this office.      Steps to Quit Smoking Smoking tobacco can be harmful to your health and can affect almost every organ in your body. Smoking puts you, and those around you, at risk for developing many serious chronic diseases. Quitting smoking is difficult, but it is one of the best things that you can do for your health. It is never too late to quit. What are the benefits of quitting smoking? When you quit smoking, you lower your risk of developing serious diseases and conditions, such as:  Lung cancer or lung disease, such as COPD.  Heart disease.  Stroke.  Heart attack.  Infertility.  Osteoporosis and bone fractures.  Additionally, symptoms such as coughing, wheezing, and shortness of breath may get better when you quit. You may also find that you get sick less often because your body is stronger at fighting off colds and infections. If you are pregnant, quitting smoking can help to reduce your chances of having a baby  of low birth weight. How do I get ready to quit? When you decide to quit smoking, create a plan to make sure that you are successful. Before you quit:  Pick a date to quit. Set a date within the next two weeks to give you time to prepare.  Write down the reasons why you are quitting. Keep this list in places where you will see it often, such as on your bathroom mirror or in your car or wallet.  Identify the people, places, things, and activities that make you want to smoke (triggers) and avoid them. Make sure to take these actions: ? Throw away all cigarettes at home, at work, and in your car. ? Throw away smoking accessories, such as Scientist, research (medical). ? Clean your car and make sure to empty the ashtray. ? Clean your home, including curtains and carpets.  Tell your family, friends, and coworkers that you are quitting. Support from your loved ones can make quitting easier.  Talk with your health care provider about your options for quitting smoking.  Find out what treatment options are covered by your health insurance.  What strategies can I use to quit smoking? Talk with your healthcare provider about different strategies to quit smoking. Some strategies include:  Quitting smoking altogether instead of gradually lessening how much you smoke over a period of time. Research shows that quitting "cold [place]" is more successful than gradually quitting.  Attending in-person counseling to help you build  problem-solving skills. You are more likely to have success in quitting if you attend several counseling sessions. Even short sessions of 10 minutes can be effective.  Finding resources and support systems that can help you to quit smoking and remain smoke-free after you quit. These resources are most helpful when you use them often. They can include: ? Online chats with a Social worker. ? Telephone quitlines. ? Careers information officer. ? Support groups or group counseling. ? Text  messaging programs. ? Mobile phone applications.  Taking medicines to help you quit smoking. (If you are pregnant or breastfeeding, talk with your health care provider first.) Some medicines contain nicotine and some do not. Both types of medicines help with cravings, but the medicines that include nicotine help to relieve withdrawal symptoms. Your health care provider may recommend: ? Nicotine patches, gum, or lozenges. ? Nicotine inhalers or sprays. ? Non-nicotine medicine that is taken by mouth.  Talk with your health care provider about combining strategies, such as taking medicines while you are also receiving in-person counseling. Using these two strategies together makes you more likely to succeed in quitting than if you used either strategy on its own. If you are pregnant or breastfeeding, talk with your health care provider about finding counseling or other support strategies to quit smoking. Do not take medicine to help you quit smoking unless told to do so by your health care provider. What things can I do to make it easier to quit? Quitting smoking might feel overwhelming at first, but there is a lot that you can do to make it easier. Take these important actions:  Reach out to your family and friends and ask that they support and encourage you during this time. Call telephone quitlines, reach out to support groups, or work with a counselor for support.  Ask people who smoke to avoid smoking around you.  Avoid places that trigger you to smoke, such as bars, parties, or smoke-break areas at work.  Spend time around people who do not smoke.  Lessen stress in your life, because stress can be a smoking trigger for some people. To lessen stress, try: ? Exercising regularly. ? Deep-breathing exercises. ? Yoga. ? Meditating. ? Performing a body scan. This involves closing your eyes, scanning your body from head to toe, and noticing which parts of your body are particularly tense.  Purposefully relax the muscles in those areas.  Download or purchase mobile phone or tablet apps (applications) that can help you stick to your quit plan by providing reminders, tips, and encouragement. There are many free apps, such as QuitGuide from the [employer] Office manager for Disease Control and Prevention). You can find other support for quitting smoking (smoking cessation) through smokefree.gov and other websites.  How will I feel when I quit smoking? Within the first 24 hours of quitting smoking, you may start to feel some withdrawal symptoms. These symptoms are usually most noticeable 2-3 days after quitting, but they usually do not last beyond 2-3 weeks. Changes or symptoms that you might experience include:  Mood swings.  Restlessness, anxiety, or irritation.  Difficulty concentrating.  Dizziness.  Strong cravings for sugary foods in addition to nicotine.  Mild weight gain.  Constipation.  Nausea.  Coughing or a sore throat.  Changes in how your medicines work in your body.  A depressed mood.  Difficulty sleeping (insomnia).  After the first 2-3 weeks of quitting, you may start to notice more positive results, such as:  Improved sense of smell and  taste.  Decreased coughing and sore throat.  Slower heart rate.  Lower blood pressure.  Clearer skin.  The ability to breathe more easily.  Fewer sick days.  Quitting smoking is very challenging for most people. Do not get discouraged if you are not successful the first time. Some people need to make many attempts to quit before they achieve long-term success. Do your best to stick to your quit plan, and talk with your health care provider if you have any questions or concerns. This information is not intended to replace advice given to you by your health care provider. Make sure you discuss any questions you have with your health care provider. Document Released: 08/11/2001 Document Revised: 04/14/2016 Document Reviewed:  01/01/2015 Elsevier Interactive Patient Education  [employer].

## 2018-05-11 ENCOUNTER — Ambulatory Visit: Payer: Medicare HMO | Admitting: Family Medicine

## 2018-05-18 ENCOUNTER — Ambulatory Visit: Payer: Medicare HMO | Admitting: Psychology

## 2018-06-10 ENCOUNTER — Ambulatory Visit: Payer: Medicare HMO | Admitting: Psychology

## 2018-06-14 ENCOUNTER — Encounter: Payer: Self-pay | Admitting: Family Medicine

## 2018-06-14 ENCOUNTER — Ambulatory Visit (INDEPENDENT_AMBULATORY_CARE_PROVIDER_SITE_OTHER): Payer: Medicare HMO | Admitting: Family Medicine

## 2018-06-14 ENCOUNTER — Other Ambulatory Visit: Payer: Self-pay

## 2018-06-14 VITALS — BP 148/80 | HR 76 | Temp 98.1°F | Resp 17 | Ht 70.87 in | Wt 216.9 lb

## 2018-06-14 DIAGNOSIS — M5136 Other intervertebral disc degeneration, lumbar region: Secondary | ICD-10-CM

## 2018-06-14 DIAGNOSIS — Z716 Tobacco abuse counseling: Secondary | ICD-10-CM | POA: Diagnosis not present

## 2018-06-14 DIAGNOSIS — Z23 Encounter for immunization: Secondary | ICD-10-CM

## 2018-06-14 DIAGNOSIS — R0989 Other specified symptoms and signs involving the circulatory and respiratory systems: Secondary | ICD-10-CM | POA: Diagnosis not present

## 2018-06-14 DIAGNOSIS — M5030 Other cervical disc degeneration, unspecified cervical region: Secondary | ICD-10-CM | POA: Diagnosis not present

## 2018-06-14 DIAGNOSIS — M62830 Muscle spasm of back: Secondary | ICD-10-CM

## 2018-06-14 DIAGNOSIS — I10 Essential (primary) hypertension: Secondary | ICD-10-CM | POA: Diagnosis not present

## 2018-06-14 DIAGNOSIS — K2100 Gastro-esophageal reflux disease with esophagitis, without bleeding: Secondary | ICD-10-CM

## 2018-06-14 MED ORDER — NICOTINE 7 MG/24HR TD PT24: 7.0000 mg | MEDICATED_PATCH | Freq: Every day | TRANSDERMAL | 1 refills | Status: DC

## 2018-06-14 NOTE — Patient Instructions (Addendum)
[facility]  Address: [address], [place], [place] [address]  Phone: [phone]     If you have lab work done today you will be contacted with your lab results within the next 2 weeks.  If you have not heard from [place] then please contact us. The fastest way to get your results is to register for My Chart.   IF you received an x-ray today, you will receive an invoice from [facility]. Please contact [facility] at [phone] with questions or concerns regarding your invoice.   IF you received labwork today, you will receive an invoice from [place]. Please contact [facility] at [phone] with questions or concerns regarding your invoice.   Our billing staff will not be able to assist you with questions regarding bills from these companies.  You will be contacted with the lab results as soon as they are available. The fastest way to get your results is to activate your My Chart account. Instructions are located on the last page of this paperwork. If you have not heard from [place] regarding the results in 2 weeks, please contact this office.    Degenerative Disk Disease Degenerative disk disease is a condition caused by the changes that occur in spinal disks as you grow older. Spinal disks are soft and compressible disks located between the bones of your spine (vertebrae). These disks act like shock absorbers. Degenerative disk disease can affect the whole spine. However, the neck and lower back are most commonly affected. Many changes can occur in the spinal disks with aging, such as:  The spinal disks may dry and shrink.  Small tears may occur in the tough, outer covering of the disk (annulus).  The disk space may become smaller due to loss of water.  Abnormal growths in the bone (spurs) may occur. This can put pressure on the nerve roots exiting the spinal canal, causing pain.  The spinal canal may become narrowed.  What increases the  risk?  Being overweight.  Having a family history of degenerative disk disease.  Smoking.  There is increased risk if you are doing heavy lifting or have a sudden injury. What are the signs or symptoms? Symptoms vary from person to person and may include:  Pain that varies in intensity. Some people have no pain, while others have severe pain. The location of the pain depends on the part of your backbone that is affected. ? You will have neck or arm pain if a disk in the neck area is affected. ? You will have pain in your back, buttocks, or legs if a disk in the lower back is affected.  Pain that becomes worse while bending, reaching up, or with twisting movements.  Pain that may start gradually and then get worse as time passes. It may also start after a major or minor injury.  Numbness or tingling in the arms or legs.  How is this diagnosed? Your health care provider will ask you about your symptoms and about activities or habits that may cause the pain. He or she may also ask about any injuries, diseases, or treatments you have had. Your health care provider will examine you to check for the range of movement that is possible in the affected area, to check for strength in your extremities, and to check for sensation in the areas of the arms and legs supplied by different nerve roots. You may also have:  An X-ray of the spine.  Other imaging tests, such as  MRI.  How is this treated? Your health care provider will advise you on the best plan for treatment. Treatment may include:  Medicines.  Rehabilitation exercises.  Follow these instructions at home:  Follow proper lifting and walking techniques as advised by your health care provider.  Maintain good posture.  Exercise regularly as advised by your health care provider.  Perform relaxation exercises.  Change your sitting, standing, and sleeping habits as advised by your health care provider.  Change positions  frequently.  Lose weight or maintain a healthy weight as advised by your health care provider.  Do not use any tobacco products, including cigarettes, chewing tobacco, or electronic cigarettes. If you need help quitting, ask your health care provider.  Wear supportive footwear.  Take medicines only as directed by your health care provider. Contact a health care provider if:  Your pain does not go away within 1-4 weeks.  You have significant appetite or weight loss. Get help right away if:  Your pain is severe.  You notice weakness in your arms, hands, or legs.  You begin to lose control of your bladder or bowel movements.  You have fevers or night sweats. This information is not intended to replace advice given to you by your health care provider. Make sure you discuss any questions you have with your health care provider. Document Released: 06/14/2007 Document Revised: 01/23/2016 Document Reviewed: 12/19/2013 Elsevier Interactive Patient Education  [employer].

## 2018-06-14 NOTE — Progress Notes (Signed)
Chief Complaint  Patient presents with  . Nicotine Dependence    HPI   Smoking cessation  Pt is here to follow up for smoking cessation He smokes cigars He is trying hard to cut back He is using floss to help with the hand to mouth fixation   Hypertension: Patient here for follow-up of elevated blood pressure. He is exercising and is adherent to low salt diet.  Blood pressure is well controlled at home but he does not check it often. Cardiac symptoms none. Patient denies chest pain, chest pressure/discomfort, claudication, dyspnea, exertional chest pressure/discomfort, fatigue, irregular heart beat and lower extremity edema.  Cardiovascular risk factors: dyslipidemia, hypertension and male gender.  BP Readings from Last 3 Encounters:  06/14/18 (!) 148/80  05/10/18 130/70  04/06/18 (!) 177/96   Wt Readings from Last 3 Encounters:  06/14/18 216 lb 14.4 oz (98.4 kg)  05/10/18 217 lb 12.8 oz (98.8 kg)  04/06/18 221 lb 6.4 oz (100.4 kg)   Degenerative disc disease He reports that he was at the mall walking for exercise and had back spasms and pain in his back and neck He states that he feels like his neck and back get stiff and he cannot get relief from his gabapentin, muscle relaxer and meloxicam He feels like massages help him so would like a referral for PT  GERD He continues to have a sensation like there is something in his throat He has not had his GI work up He has a choking sensation at times  Past Medical History:  Diagnosis Date  . Anxiety   . At risk for sleep apnea    STOP-BANG= 4   SENT TO PCP 04-02-2014  . Chronic low back pain   . Depression   . ED (erectile dysfunction)   . History of colon polyps   . History of gastric ulcer    as child  . Hypertension   . Hypogonadism male   . Incomplete bladder emptying   . Lumbar spine scoliosis   . Premature ejaculation   . Prostate cancer [facility])    Dr Tammi Klippel ( oncologist) and Dr Loel Lofty [facility])   .  Retention of urine, unspecified   . Sleep apnea    states never told to wear CPAP  . Wears glasses     Current Outpatient Medications  Medication Sig Dispense Refill  . amLODipine (NORVASC) 10 MG tablet Take 1 tablet (10 mg total) by mouth daily. 90 tablet 1  . b complex vitamins tablet Take 1 tablet by mouth daily.    . Calcium-Cholecalciferol (VITA-CALCIUM PO) Take by mouth.    . Cholecalciferol (VITAMIN D3) 1000 units CAPS Take by mouth.    . enalapril (VASOTEC) 20 MG tablet Take 1 tablet (20 mg total) by mouth daily. 90 tablet 1  . fluticasone (FLONASE) 50 MCG/ACT nasal spray Place 2 sprays into both nostrils daily. 16 g 11  . gabapentin (NEURONTIN) 300 MG capsule Take 3 capsules (900 mg total) by mouth 3 (three) times daily. 270 capsule 5  . meloxicam (MOBIC) 15 MG tablet Take 1 tablet (15 mg total) by mouth daily as needed for pain. 90 tablet 1  . Multiple Vitamins-Minerals-FA (VITATAB MV PO) Take by mouth.    . Olopatadine HCl (PATADAY) 0.2 % SOLN Apply 1 drop to eye daily. 2.5 mL 3  . saw palmetto 160 MG capsule Take 160 mg by mouth 2 (two) times daily.    Marland Kitchen tiZANidine (ZANAFLEX) 4 MG capsule Take  1 capsule (4 mg total) by mouth 3 (three) times daily as needed for muscle spasms. 90 capsule 5  . traZODone (DESYREL) 100 MG tablet Take 1 tablet (100 mg total) by mouth at bedtime. 90 tablet 1  . triamcinolone ointment (KENALOG) 0.5 % Apply 1 application topically 2 (two) times daily. 30 g 0  . Turmeric (RA TURMERIC) 500 MG CAPS Take 1 tablet by mouth. Reported on 08/19/2015    . zolpidem (AMBIEN) 10 MG tablet take 1 tablet by mouth at bedtime if needed for sleep 30 tablet 1  . nicotine (NICODERM CQ - DOSED IN MG/24 HR) 7 mg/24hr patch Place 1 patch (7 mg total) onto the skin daily. 28 patch 1   No current facility-administered medications for this visit.     Allergies: No Known Allergies  Past Surgical History:  Procedure Laterality Date  . APPENDECTOMY  age 29  . COLONOSCOPY W/  POLYPECTOMY  july 2015  . LUMBAR FUSION  04/ 2012   and [place]  . PROSTATE BIOPSY    . RADIOACTIVE SEED IMPLANT N/A 04/06/2014   Procedure: RADIOACTIVE SEED IMPLANT;  Surgeon: [name], MD;  Location: [facility];  Service: Urology;  Laterality: N/A;  dr portable     Social History   Socioeconomic History  . Marital status: Divorced    Spouse name: Not on file  . Number of children: Not on file  . Years of education: Not on file  . Highest education level: Some college, no degree  Occupational History  . Occupation: disability    Comment: DDD lumbar spine; 2013  Social Needs  . Financial resource strain: Hard  . Food insecurity:    Worry: Often true    Inability: Often true  . Transportation needs:    Medical: No    Non-medical: No  Tobacco Use  . Smoking status: Current Some Day Smoker    Packs/day: 1.00    Years: 10.00    Pack years: 10.00    Types: Cigarettes, Cigars  . Smokeless tobacco: Never Used  . Tobacco comment: currently smokes 2 small cigar per day/  quit cigarettes 2011  Substance and Sexual Activity  . Alcohol use: Yes    Alcohol/week: 0.0 standard drinks    Comment: 1-2 glasses of wine daily  . Drug use: Yes    Types: Marijuana  . Sexual activity: Never    Birth control/protection: Abstinence  Lifestyle  . Physical activity:    Days per week: 2 days    Minutes per session: 60 min  . Stress: Rather much  Relationships  . Social connections:    Talks on phone: More than three times a week    Gets together: More than three times a week    Attends religious service: More than 4 times per year    Active member of club or organization: No    Attends meetings of clubs or organizations: Never    Relationship status: Divorced  Other Topics Concern  . Not on file  Social History Narrative   Marital status: divorced; dating in 2019      Children: one foster son (autistic_      Lives: with foster son      Employment: disability from DDD  lumbar      Tobacco: none      Alcohol: none      Drugs:      Exercise: minimal in 2019.      ADLs; drives; independent with ADLs.  Advanced Directives: none; FULL CODE.  Johnsie Cancel [phone] [facility] [phone].    Family History  Problem Relation Age of Onset  . Prostate cancer Father   . Hypertension Mother   . Pulmonary fibrosis Mother   . Arthritis Mother   . Diabetes Mother   . Deep vein thrombosis Sister      ROS Review of Systems See HPI Constitution: No fevers or chills No malaise No diaphoresis Skin: No rash or itching Eyes: no blurry vision, no double vision GU: no dysuria or hematuria Neuro: no dizziness or headaches all others reviewed and negative   Objective: Vitals:   06/14/18 1110 06/14/18 1138  BP: (!) 165/93 (!) 148/80  Pulse: 76   Resp: 17   Temp: 98.1 F (36.7 C)   TempSrc: Oral   SpO2: 98%   Weight: 216 lb 14.4 oz (98.4 kg)   Height: 5' 10.87" (1.8 m)     Physical Exam  Constitutional: He is oriented to person, place, and time. He appears well-developed and well-nourished.  HENT:  Head: Normocephalic and atraumatic.  Eyes: Conjunctivae and EOM are normal.  Cardiovascular: Normal rate, regular rhythm and normal heart sounds.  No murmur heard. Pulmonary/Chest: Effort normal and breath sounds normal. No stridor. No respiratory distress.  Neurological: He is alert and oriented to person, place, and time.     Assessment and Plan Giovanie was seen today for nicotine dependence.  Diagnoses and all orders for this visit:  Essential hypertension- Patient's blood pressure is CLOSE TO  goal of 139/89 or less. Condition is stable. Continue current medications and treatment plan. I recommend that you exercise for 30-45 minutes 5 days a week. I also recommend a balanced diet with fruits and vegetables every day, lean meats, and little fried foods. The DASH diet (you can find this online) is a good example of  this.   Gastroesophageal reflux disease with esophagitis- gave information for patient to contact [facility]  Choking sensation- continue small meals and avoid acidic foods  Degenerative disc disease, cervical -     Ambulatory referral to Physical Therapy  Degenerative disc disease, lumbar -     Ambulatory referral to Physical Therapy  Encounter for smoking cessation counseling- discuss usage and nicotine replacements to prevent withdrawal -     nicotine (NICODERM CQ - DOSED IN MG/24 HR) 7 mg/24hr patch; Place 1 patch (7 mg total) onto the skin daily.  Flu vaccine need -     Flu Vaccine QUAD 36+ mos IM  Muscle spasm of back -     Ambulatory referral to Physical Therapy     Justin [place]

## 2018-07-08 ENCOUNTER — Ambulatory Visit (INDEPENDENT_AMBULATORY_CARE_PROVIDER_SITE_OTHER): Payer: Medicare HMO | Admitting: Psychology

## 2018-07-08 DIAGNOSIS — R69 Illness, unspecified: Secondary | ICD-10-CM | POA: Diagnosis not present

## 2018-07-08 DIAGNOSIS — F418 Other specified anxiety disorders: Secondary | ICD-10-CM | POA: Diagnosis not present

## 2018-08-12 ENCOUNTER — Ambulatory Visit: Payer: Self-pay | Admitting: Psychology

## 2018-09-09 ENCOUNTER — Ambulatory Visit: Payer: Self-pay

## 2018-09-15 ENCOUNTER — Other Ambulatory Visit: Payer: Self-pay

## 2018-09-15 ENCOUNTER — Encounter: Payer: Self-pay | Admitting: Family Medicine

## 2018-09-15 ENCOUNTER — Ambulatory Visit (INDEPENDENT_AMBULATORY_CARE_PROVIDER_SITE_OTHER): Payer: Medicare HMO | Admitting: Family Medicine

## 2018-09-15 VITALS — BP 146/91 | HR 82 | Temp 98.4°F | Resp 17 | Ht 70.87 in | Wt 222.0 lb

## 2018-09-15 DIAGNOSIS — Z0000 Encounter for general adult medical examination without abnormal findings: Secondary | ICD-10-CM

## 2018-09-15 DIAGNOSIS — I10 Essential (primary) hypertension: Secondary | ICD-10-CM

## 2018-09-15 DIAGNOSIS — Z716 Tobacco abuse counseling: Secondary | ICD-10-CM | POA: Diagnosis not present

## 2018-09-15 DIAGNOSIS — Z1211 Encounter for screening for malignant neoplasm of colon: Secondary | ICD-10-CM | POA: Diagnosis not present

## 2018-09-15 MED ORDER — ENALAPRIL MALEATE 20 MG PO TABS: 20.0000 mg | ORAL_TABLET | Freq: Every day | ORAL | 1 refills | Status: DC

## 2018-09-15 MED ORDER — AMLODIPINE BESYLATE 10 MG PO TABS: 10.0000 mg | ORAL_TABLET | Freq: Every day | ORAL | 1 refills | Status: DC

## 2018-09-15 MED ORDER — NICOTINE 7 MG/24HR TD PT24: 7.0000 mg | MEDICATED_PATCH | Freq: Every day | TRANSDERMAL | 1 refills | Status: DC

## 2018-09-15 MED ORDER — OLOPATADINE HCL 0.2 % OP SOLN: 1.0000 [drp] | Freq: Every day | OPHTHALMIC | 3 refills | Status: DC

## 2018-09-15 NOTE — Patient Instructions (Addendum)
If you have lab work done today you will be contacted with your lab results within the next 2 weeks.  If you have not heard from [place] then please contact us. The fastest way to get your results is to register for My Chart.   IF you received an x-ray today, you will receive an invoice from [facility]. Please contact [facility] at [phone] with questions or concerns regarding your invoice.   IF you received labwork today, you will receive an invoice from [place]. Please contact [facility] at [phone] with questions or concerns regarding your invoice.   Our billing staff will not be able to assist you with questions regarding bills from these companies.  You will be contacted with the lab results as soon as they are available. The fastest way to get your results is to activate your My Chart account. Instructions are located on the last page of this paperwork. If you have not heard from [place] regarding the results in 2 weeks, please contact this office.     Cancer Screening for Men A cancer screening is a test or exam that checks for cancer. Your health care provider will recommend specific cancer screenings based on your age, personal history, and family history of cancer. Work with your health care provider to create a cancer screening schedule that protects your health. Why is cancer screening done? Cancer screening is done to look for cancer in the very early stages, before it spreads and becomes harder to treat and before you would start to notice symptoms. Finding cancer early improves the chances of successful treatment. It may save your life. Who should be screened for cancer? All men should be screened for colorectal cancer and skin cancer. Your health care provider may recommend screenings for other types of cancer if:  You had cancer before.  You have a family member with cancer.  You have abnormal genes that could increase the risk of cancer.  You have  risk factors for certain cancers, such as smoking. When you should be screened for cancer depends on:  Your age.  Your medical history and your family's medical history.  Certain lifestyle factors, such as smoking.  Environmental exposure, such as to asbestos. What are some common cancer screenings? Lung cancer Lung cancer screening is done with a CT scan that looks for abnormal cells in the lungs. Discuss lung cancer screening with your health care provider if you are 76-62 years old and if any of the following apply to you:  You currently smoke.  You used to smoke heavily.  You have a smoking history of 1 pack a day for 30 years or 2 packs a day for 15 years.  You have quit smoking within the past 15 years. If you smoke heavily or if you used to smoke, you may need to be screened every year. Prostate cancer Prostate cancer screening is done with blood tests and an exam in which a health care provider uses a gloved finger to check prostate size (digital rectal exam). You may need to be screened for prostate cancer if:  You have risk factors of prostate cancer, such as being African American or having a close family member with prostate cancer.  You have inherited gene changes or a genetic condition, including BRCA1 or BRCA2 gene mutations or Lynch syndrome.  You have symptoms of prostate cancer, such as problems urinating or erectile dysfunction. Prostate cancer screening for men with average risk may start at age  12. Men with risk factors may need to be screened earlier at age 91-45. Once you have been screened for prostate cancer, future screening may be recommended based on the results of your blood tests. Colorectal cancer  All adults should have screening for colorectal cancer starting at age 63 and continuing until age 17. Your health care provider may recommend screening at age 8. You will have tests every 1-10 years, depending on your results and the type of screening  test. If you have a family history of colon or rectal cancer or other risk factors, you may need to start having screenings earlier. Talk with your health care provider about which screening test is right for you and how often you should be screened. Colorectal cancer screening looks for cancer or for growths called polyps that often form before cancer starts. Tests to look for cancer or polyps include:  Colonoscopy or flexible sigmoidoscopy. For these procedures, a flexible tube with a small camera is inserted into the rectum.  CT colonography. This test uses X-rays and a contrast dye to check the colon for polyps. If a polyp is found, you may need to have a colonoscopy so the polyp can be located and removed. Tests to look for cancer in the stool (feces) include:  Guaiac-based fecal occult blood test (FOBT). This test detects blood in stool. It can be done at home with a kit.  Fecal immunochemical test (FIT). This test detects blood in stool. For this test, you will need to collect stool samples at home.  Stool DNA test. This test looks for blood in stool and any changes in DNA that can lead to colon cancer. For this test, you will need to collect a stool sample at home and send it to a lab.  Skin cancer Skin cancer screening is done by checking the skin for unusual moles or spots and any changes in existing moles. Your health care provider should check your skin for signs of skin cancer at every physical exam. You should check your skin every month and tell your health care provider right away if anything looks unusual. Men with a higher-than-normal risk for skin cancer may want to see a skin specialist (dermatologist) for an annual body check. Where to find more information  [place]: SkinPromotion.no  Centers for Disease Control and Prevention: http://knight-sullivan.biz/  American Cancer Society:  https://www.cancer.org/latest-news/4-cancer-screening-tests-for-men.html Contact a health care provider if:  You have concerns about any signs or symptoms of cancer, such as: ? Moles that have an unusual shape or color. ? Changes in existing moles. ? A sore on your skin that does not heal. ? Blood in your urine or stool. ? Fatigue that does not go away. ? Frequent pain or cramping in your abdomen. ? Coughing or trouble breathing that does not go away. ? Coughing up blood. ? Losing weight without trying. ? Changes in urination habits. ? Painful urination or ejaculation. Summary  Be aware of and watch for signs and symptoms of cancer, especially symptoms of lung cancer, prostate cancer, colorectal cancer, and skin cancer.  Early detection of cancer with cancer screening may save your life.  Talk with your health care provider about your specific cancer risks.  Work together with your health care provider to create a cancer screening plan that is right for you. This information is not intended to replace advice given to you by your health care provider. Make sure you discuss any questions you have with your health care provider.  Document Released: 05/14/2016 Document Revised: 05/06/2018 Document Reviewed: 05/14/2016 Elsevier Interactive Patient Education  [employer].

## 2018-09-15 NOTE — Progress Notes (Signed)
QUICK REFERENCE INFORMATION: The ABCs of Providing the Annual Wellness Visit  CMS.[place]  [facility] Wellness Visit  Chief Complaint  Patient presents with  . annual wellness exam    using a little cannibus but nothing else  . Medication Refill    nicoderm  pt  . discuss colonoscopy with Dr. Jacelyn Grip off [address], [place]    last colonoscopy 2010    Subjective:   Justin Dunn is a 58 y.o. Male who presents for an Annual Wellness Visit.  Patient Active Problem List   Diagnosis Date Noted  . Degenerative disc disease, lumbar 06/01/2016  . Degenerative disc disease, cervical 06/01/2016  . Insomnia due to other mental disorder 06/01/2016  . Adjustment disorder with mixed anxiety and depressed mood 06/01/2016  . Essential (primary) hypertension 02/07/2015  . Malignant neoplasm of prostate ([place]) 01/09/2014    Past Medical History:  Diagnosis Date  . Anxiety   . At risk for sleep apnea    STOP-BANG= 4   SENT TO PCP 04-02-2014  . Chronic low back pain   . Depression   . ED (erectile dysfunction)   . History of colon polyps   . History of gastric ulcer    as child  . Hypertension   . Hypogonadism male   . Incomplete bladder emptying   . Lumbar spine scoliosis   . Premature ejaculation   . Prostate cancer [facility])    Dr Tammi Klippel ( oncologist) and Dr Loel Lofty [facility])   . Retention of urine, unspecified   . Sleep apnea    states never told to wear CPAP  . Wears glasses      Past Surgical History:  Procedure Laterality Date  . APPENDECTOMY  age 29  . COLONOSCOPY W/ POLYPECTOMY  july 2015  . LUMBAR FUSION  04/ 2012   and rod  . PROSTATE BIOPSY    . RADIOACTIVE SEED IMPLANT N/A 04/06/2014   Procedure: RADIOACTIVE SEED IMPLANT;  Surgeon: [name], MD;  Location: [facility];  Service: Urology;  Laterality: N/A;  dr portable      Outpatient Medications Prior to Visit  Medication Sig Dispense Refill  . b complex  vitamins tablet Take 1 tablet by mouth daily.    . Calcium-Cholecalciferol (VITA-CALCIUM PO) Take by mouth.    . Cholecalciferol (VITAMIN D3) 1000 units CAPS Take by mouth.    . fluticasone (FLONASE) 50 MCG/ACT nasal spray Place 2 sprays into both nostrils daily. 16 g 11  . gabapentin (NEURONTIN) 300 MG capsule Take 3 capsules (900 mg total) by mouth 3 (three) times daily. 270 capsule 5  . meloxicam (MOBIC) 15 MG tablet Take 1 tablet (15 mg total) by mouth daily as needed for pain. 90 tablet 1  . Multiple Vitamins-Minerals-FA (VITATAB MV PO) Take by mouth.    . saw palmetto 160 MG capsule Take 160 mg by mouth 2 (two) times daily.    Marland Kitchen tiZANidine (ZANAFLEX) 4 MG capsule Take 1 capsule (4 mg total) by mouth 3 (three) times daily as needed for muscle spasms. 90 capsule 5  . traZODone (DESYREL) 100 MG tablet Take 1 tablet (100 mg total) by mouth at bedtime. 90 tablet 1  . triamcinolone ointment (KENALOG) 0.5 % Apply 1 application topically 2 (two) times daily. 30 g 0  . Turmeric (RA TURMERIC) 500 MG CAPS Take 1 tablet by mouth. Reported on 08/19/2015    . zolpidem (AMBIEN) 10 MG tablet take 1 tablet by  mouth at bedtime if needed for sleep 30 tablet 1  . amLODipine (NORVASC) 10 MG tablet Take 1 tablet (10 mg total) by mouth daily. 90 tablet 1  . enalapril (VASOTEC) 20 MG tablet Take 1 tablet (20 mg total) by mouth daily. 90 tablet 1  . nicotine (NICODERM CQ - DOSED IN MG/24 HR) 7 mg/24hr patch Place 1 patch (7 mg total) onto the skin daily. 28 patch 1  . Olopatadine HCl (PATADAY) 0.2 % SOLN Apply 1 drop to eye daily. 2.5 mL 3   No facility-administered medications prior to visit.     No Known Allergies   Family History  Problem Relation Age of Onset  . Prostate cancer Father   . Hypertension Mother   . Pulmonary fibrosis Mother   . Arthritis Mother   . Diabetes Mother   . Deep vein thrombosis Sister      Social History   Socioeconomic History  . Marital status: Divorced    Spouse  name: Not on file  . Number of children: Not on file  . Years of education: Not on file  . Highest education level: Some college, no degree  Occupational History  . Occupation: disability    Comment: DDD lumbar spine; 2013  Social Needs  . Financial resource strain: Hard  . Food insecurity:    Worry: Often true    Inability: Often true  . Transportation needs:    Medical: No    Non-medical: No  Tobacco Use  . Smoking status: Current Some Day Smoker    Packs/day: 1.00    Years: 10.00    Pack years: 10.00    Types: Cigarettes, Cigars  . Smokeless tobacco: Never Used  . Tobacco comment: currently smokes 2 small cigar per day/  quit cigarettes 2011  Substance and Sexual Activity  . Alcohol use: Yes    Alcohol/week: 0.0 standard drinks    Comment: 1-2 glasses of wine daily  . Drug use: Yes    Types: Marijuana  . Sexual activity: Never    Birth control/protection: Abstinence  Lifestyle  . Physical activity:    Days per week: 2 days    Minutes per session: 60 min  . Stress: Rather much  Relationships  . Social connections:    Talks on phone: More than three times a week    Gets together: More than three times a week    Attends religious service: More than 4 times per year    Active member of club or organization: No    Attends meetings of clubs or organizations: Never    Relationship status: Divorced  Other Topics Concern  . Not on file  Social History Narrative   Marital status: divorced; dating in 2019      Children: one foster son (autistic_      Lives: with foster son      Employment: disability from DDD lumbar      Tobacco: none      Alcohol: none      Drugs:      Exercise: minimal in 2019.      ADLs; drives; independent with ADLs.      Advanced Directives: none; FULL CODE.  [name] [employer] [phone].      Recent Hospitalizations? No  Health Habits: Current exercise activities include: walking Exercise: 2  times/week. Diet: in general, a "healthy" diet      Health Risk Assessment: The patient has completed a Health Risk Assessment. This has been reveiwed with  them and has been scanned into the [place] system as an attached document.  Current Medical Providers and Suppliers: [facility] Patient Care Team: Forrest Moron, MD as PCP - General (Internal Medicine) Myrlene Broker, MD as Attending Physician (Urology) No future appointments.   Age-appropriate Screening Schedule: The list below includes current immunization status and future screening recommendations based on patient's age. Orders for these recommended tests are listed in the plan section. The patient has been provided with a written plan. Immunization History  Administered Date(s) Administered  . Influenza,inj,Quad PF,6+ Mos 05/12/2017, 06/14/2018  . PPD Test 08/19/2015, 09/08/2016     Depression Screen-PHQ2/9 completed today  Depression screen [facility] 2/9 09/15/2018 06/14/2018 05/10/2018 04/06/2018 12/29/2017  Decreased Interest 0 0 0 0 0  Down, Depressed, Hopeless 0 0 0 0 0  PHQ - 2 Score 0 0 0 0 0  Altered sleeping - - - - -  Tired, decreased energy - - - - -  Change in appetite - - - - -  Feeling bad or failure about yourself  - - - - -  Trouble concentrating - - - - -  Moving slowly or fidgety/restless - - - - -  Suicidal thoughts - - - - -  PHQ-9 Score - - - - -  Difficult doing work/chores - - - - -       Depression Severity and Treatment Recommendations:  0-4= None  5-9= Mild / Treatment: Support, educate to call if worse; return in one month  10-14= Moderate / Treatment: Support, watchful waiting; Antidepressant or Psycotherapy  15-19= Moderately severe / Treatment: Antidepressant OR Psychotherapy  >= 20 = Major depression, severe / Antidepressant AND Psychotherapy  Functional Status Survey:   Is the patient deaf or have difficulty hearing?: No Does the patient have difficulty seeing, even when wearing  glasses/contacts?: No Does the patient have difficulty concentrating, remembering, or making decisions?: Yes(intermittent at times) Does the patient have difficulty walking or climbing stairs?: No Does the patient have difficulty dressing or bathing?: No Does the patient have difficulty doing errands alone such as visiting a doctor's office or shopping?: No  Hearing Evaluation: 1. Do you have trouble hearing the television when others do not?   No 2. Do you have to strain to hear/understand conversations? No   Advanced Care Planning: 1. Patient has executed an Advance Directive: No  Cognitive Assessment: Does the patient have evidence of cognitive impairment? No The patient does not have any evidence of any cognitive problems and denies any  change in mood/affect, appearance, speech, memory or motor skills.  Identification of Risk Factors: Risk factors include: hypertension and smoking  ROS  Review of Systems  Constitutional: Negative for activity change, appetite change, chills and fever.  HENT: Negative for congestion, nosebleeds, trouble swallowing and voice change.   Respiratory: Negative for cough, shortness of breath and wheezing.   Gastrointestinal: Negative for diarrhea, nausea and vomiting.  Genitourinary: Negative for difficulty urinating, dysuria, flank pain and hematuria.  Musculoskeletal: Negative for back pain, joint swelling and neck pain.  Neurological: Negative for dizziness, speech difficulty, light-headedness and numbness.  See HPI. All other review of systems negative.    Objective:   Vitals:   09/15/18 1059  BP: (!) 146/91  Pulse: 82  Resp: 17  Temp: 98.4 F (36.9 C)  TempSrc: Oral  SpO2: 97%  Weight: 222 lb (100.7 kg)  Height: 5' 10.87" (1.8 m)   Wt Readings from Last 3 Encounters:  09/15/18 222  lb (100.7 kg)  06/14/18 216 lb 14.4 oz (98.4 kg)  05/10/18 217 lb 12.8 oz (98.8 kg)    Body mass index is 31.08 kg/m.  Physical Exam   Constitutional: Oriented to person, place, and time. Appears well-developed and well-nourished.  HENT:  Head: Normocephalic and atraumatic.  Eyes: Conjunctivae and EOM are normal.  Cardiovascular: Normal rate, regular rhythm, normal heart sounds and intact distal pulses.  No murmur heard. Pulmonary/Chest: Effort normal and breath sounds normal. No stridor. No respiratory distress. Has no wheezes.  Neurological: Is alert and oriented to person, place, and time.  Skin: Skin is warm. Capillary refill takes less than 2 seconds.  Psychiatric: Has a normal mood and affect. Behavior is normal. Judgment and thought content normal.  GU: deferred since pt has urology appt   Assessment/Plan:   Patient Self-Management and Personalized Health Advice The patient has been provided with information about:  quit smoking, begin progressive daily aerobic exercise program, attempt to lose weight, reduce salt in diet and cooking and reduce exposure to stress  During the course of the visit the patient was educated and counseled about appropriate screening and preventive services including:   lab testing as noted in orders section, return annually or prn     Body mass index is 31.08 kg/m. Discussed the patient's BMI with him. The BMI BMI is not in the acceptable range; BMI management plan is completed  Justin Dunn was seen today for annual wellness exam, medication refill and discuss colonoscopy with dr. Jacelyn Grip off [address], [place].  Diagnoses and all orders for this visit:  Encounter for Medicare annual wellness exam  Colon cancer screening -     Ambulatory referral to Gastroenterology  Essential hypertension -     Comprehensive metabolic panel -     Lipid panel  Encounter for smoking cessation counseling -     nicotine (NICODERM CQ - DOSED IN MG/24 HR) 7 mg/24hr patch; Place 1 patch (7 mg total) onto the skin daily.  Other orders -     enalapril (VASOTEC) 20 MG tablet; Take 1 tablet (20 mg  total) by mouth daily. -     amLODipine (NORVASC) 10 MG tablet; Take 1 tablet (10 mg total) by mouth daily. -     Olopatadine HCl (PATADAY) 0.2 % SOLN; Apply 1 drop to eye daily.      No follow-ups on file.  No future appointments.  Patient Instructions       If you have lab work done today you will be contacted with your lab results within the next 2 weeks.  If you have not heard from [place] then please contact us. The fastest way to get your results is to register for My Chart.   IF you received an x-ray today, you will receive an invoice from [facility]. Please contact [facility] at [phone] with questions or concerns regarding your invoice.   IF you received labwork today, you will receive an invoice from [facility]. Please contact [facility] at [phone] with questions or concerns regarding your invoice.   Our billing staff will not be able to assist you with questions regarding bills from these companies.  You will be contacted with the lab results as soon as they are available. The fastest way to get your results is to activate your My Chart account. Instructions are located on the last page of this paperwork. If you have not heard from [place] regarding the results in 2 weeks, please contact this office.  Cancer Screening for Men A cancer screening is a test or exam that checks for cancer. Your health care provider will recommend specific cancer screenings based on your age, personal history, and family history of cancer. Work with your health care provider to create a cancer screening schedule that protects your health. Why is cancer screening done? Cancer screening is done to look for cancer in the very early stages, before it spreads and becomes harder to treat and before you would start to notice symptoms. Finding cancer early improves the chances of successful treatment. It may save your life. Who should be screened for cancer? All men should be  screened for colorectal cancer and skin cancer. Your health care provider may recommend screenings for other types of cancer if:  You had cancer before.  You have a family member with cancer.  You have abnormal genes that could increase the risk of cancer.  You have risk factors for certain cancers, such as smoking. When you should be screened for cancer depends on:  Your age.  Your medical history and your family's medical history.  Certain lifestyle factors, such as smoking.  Environmental exposure, such as to asbestos. What are some common cancer screenings? Lung cancer Lung cancer screening is done with a CT scan that looks for abnormal cells in the lungs. Discuss lung cancer screening with your health care provider if you are 31-10 years old and if any of the following apply to you:  You currently smoke.  You used to smoke heavily.  You have a smoking history of 1 pack a day for 30 years or 2 packs a day for 15 years.  You have quit smoking within the past 15 years. If you smoke heavily or if you used to smoke, you may need to be screened every year. Prostate cancer Prostate cancer screening is done with blood tests and an exam in which a health care provider uses a gloved finger to check prostate size (digital rectal exam). You may need to be screened for prostate cancer if:  You have risk factors of prostate cancer, such as being African American or having a close family member with prostate cancer.  You have inherited gene changes or a genetic condition, including BRCA1 or BRCA2 gene mutations or Lynch syndrome.  You have symptoms of prostate cancer, such as problems urinating or erectile dysfunction. Prostate cancer screening for men with average risk may start at age 25. Men with risk factors may need to be screened earlier at age 40-45. Once you have been screened for prostate cancer, future screening may be recommended based on the results of your blood  tests. Colorectal cancer  All adults should have screening for colorectal cancer starting at age 93 and continuing until age 56. Your health care provider may recommend screening at age 37. You will have tests every 1-10 years, depending on your results and the type of screening test. If you have a family history of colon or rectal cancer or other risk factors, you may need to start having screenings earlier. Talk with your health care provider about which screening test is right for you and how often you should be screened. Colorectal cancer screening looks for cancer or for growths called polyps that often form before cancer starts. Tests to look for cancer or polyps include:  Colonoscopy or flexible sigmoidoscopy. For these procedures, a flexible tube with a small camera is inserted into the rectum.  CT colonography. This test uses X-rays and a contrast  dye to check the colon for polyps. If a polyp is found, you may need to have a colonoscopy so the polyp can be located and removed. Tests to look for cancer in the stool (feces) include:  Guaiac-based fecal occult blood test (FOBT). This test detects blood in stool. It can be done at home with a kit.  Fecal immunochemical test (FIT). This test detects blood in stool. For this test, you will need to collect stool samples at home.  Stool DNA test. This test looks for blood in stool and any changes in DNA that can lead to colon cancer. For this test, you will need to collect a stool sample at home and send it to a lab.  Skin cancer Skin cancer screening is done by checking the skin for unusual moles or spots and any changes in existing moles. Your health care provider should check your skin for signs of skin cancer at every physical exam. You should check your skin every month and tell your health care provider right away if anything looks unusual. Men with a higher-than-normal risk for skin cancer may want to see a skin specialist (dermatologist)  for an annual body check. Where to find more information  [place]: SkinPromotion.no  Centers for Disease Control and Prevention: http://knight-sullivan.biz/  American Cancer Society: https://www.cancer.org/latest-news/4-cancer-screening-tests-for-men.html Contact a health care provider if:  You have concerns about any signs or symptoms of cancer, such as: ? Moles that have an unusual shape or color. ? Changes in existing moles. ? A sore on your skin that does not heal. ? Blood in your urine or stool. ? Fatigue that does not go away. ? Frequent pain or cramping in your abdomen. ? Coughing or trouble breathing that does not go away. ? Coughing up blood. ? Losing weight without trying. ? Changes in urination habits. ? Painful urination or ejaculation. Summary  Be aware of and watch for signs and symptoms of cancer, especially symptoms of lung cancer, prostate cancer, colorectal cancer, and skin cancer.  Early detection of cancer with cancer screening may save your life.  Talk with your health care provider about your specific cancer risks.  Work together with your health care provider to create a cancer screening plan that is right for you. This information is not intended to replace advice given to you by your health care provider. Make sure you discuss any questions you have with your health care provider. Document Released: 05/14/2016 Document Revised: 05/06/2018 Document Reviewed: 05/14/2016 Elsevier Interactive Patient Education  [employer].    An after visit summary with all of these plans was given to the patient.

## 2018-09-16 ENCOUNTER — Telehealth: Payer: Self-pay | Admitting: Family Medicine

## 2018-09-16 ENCOUNTER — Encounter: Payer: Self-pay | Admitting: Family Medicine

## 2018-09-16 LAB — LIPID PANEL
CHOL/HDL RATIO: 3.2 ratio (ref 0.0–5.0)
Cholesterol, Total: 159 mg/dL (ref 100–199)
HDL: 49 mg/dL (ref >39)
LDL Calculated: 89 mg/dL (ref 0–99)
Triglycerides: 103 mg/dL (ref 0–149)
VLDL CHOLESTEROL CAL: 21 mg/dL (ref 5–40)

## 2018-09-16 LAB — COMPREHENSIVE METABOLIC PANEL
A/G RATIO: 1.9 (ref 1.2–2.2)
ALBUMIN: 4.8 g/dL (ref 3.5–5.5)
ALT: 19 IU/L (ref 0–44)
AST: 23 IU/L (ref 0–40)
Alkaline Phosphatase: 91 IU/L (ref 39–117)
BILIRUBIN TOTAL: 0.3 mg/dL (ref 0.0–1.2)
BUN / CREAT RATIO: 16 (ref 9–20)
BUN: 17 mg/dL (ref 6–24)
CALCIUM: 9.5 mg/dL (ref 8.7–10.2)
CO2: 21 mmol/L (ref 20–29)
Chloride: 101 mmol/L (ref 96–106)
Creatinine, Ser: 1.07 mg/dL (ref 0.76–1.27)
GFR, EST AFRICAN AMERICAN: 87 mL/min/{1.73_m2} (ref >59)
GFR, EST NON AFRICAN AMERICAN: 76 mL/min/{1.73_m2} (ref >59)
Globulin, Total: 2.5 g/dL (ref 1.5–4.5)
Glucose: 82 mg/dL (ref 65–99)
POTASSIUM: 4.4 mmol/L (ref 3.5–5.2)
Sodium: 139 mmol/L (ref 134–144)
TOTAL PROTEIN: 7.3 g/dL (ref 6.0–8.5)

## 2018-09-16 NOTE — Telephone Encounter (Signed)
Called pt to give him referral appt pt stated that he will call back and get the information. He is scheduled for Dr. Benson [place] office on 12/19/2018 at 8:45am their phone number is [phone].

## 2018-10-03 DIAGNOSIS — Z8546 Personal history of malignant neoplasm of prostate: Secondary | ICD-10-CM | POA: Diagnosis not present

## 2018-10-03 DIAGNOSIS — Z08 Encounter for follow-up examination after completed treatment for malignant neoplasm: Secondary | ICD-10-CM | POA: Diagnosis not present

## 2018-10-03 DIAGNOSIS — C61 Malignant neoplasm of prostate: Secondary | ICD-10-CM | POA: Diagnosis not present

## 2018-10-05 ENCOUNTER — Ambulatory Visit: Payer: Medicare HMO

## 2018-10-19 ENCOUNTER — Other Ambulatory Visit: Payer: Self-pay

## 2018-10-19 ENCOUNTER — Ambulatory Visit: Payer: Medicare HMO | Attending: Family Medicine | Admitting: Physical Therapy

## 2018-10-19 DIAGNOSIS — M545 Low back pain, unspecified: Secondary | ICD-10-CM

## 2018-10-19 DIAGNOSIS — M542 Cervicalgia: Secondary | ICD-10-CM | POA: Diagnosis not present

## 2018-10-19 DIAGNOSIS — M62838 Other muscle spasm: Secondary | ICD-10-CM | POA: Diagnosis not present

## 2018-10-19 DIAGNOSIS — G8929 Other chronic pain: Secondary | ICD-10-CM

## 2018-10-19 NOTE — Therapy (Addendum)
Justin Dunn, Justin Dunn, Justin Dunn Phone: [phone]   Fax:  [phone]  Physical Therapy Evaluation / Discharge summary  Patient Details  Name: Justin Dunn. MRN: [id number] Date of Birth: October 19, 1960 Referring Provider (PT): Delia Chimes MD   Encounter Date: 10/19/2018  PT End of Session - 10/19/18 1022    Visit Number  1    Number of Visits  13    Date for PT Re-Evaluation  11/30/18    Authorization Type  MCR: Kx mod by 15th visit, progress note by 10th visit    PT Start Time  1018    PT Stop Time  1101    PT Time Calculation (min)  43 min    Activity Tolerance  Patient tolerated treatment well    Behavior During Therapy  [facility] for tasks assessed/performed       Past Medical History:  Diagnosis Date  . Anxiety   . At risk for sleep apnea    STOP-BANG= 4   SENT TO PCP 04-02-2014  . Chronic low back pain   . Depression   . ED (erectile dysfunction)   . History of colon polyps   . History of gastric ulcer    as child  . Hypertension   . Hypogonadism male   . Incomplete bladder emptying   . Lumbar spine scoliosis   . Premature ejaculation   . Prostate cancer [facility])    Dr Tammi Klippel ( oncologist) and Dr Loel Lofty [facility])   . Retention of urine, unspecified   . Sleep apnea    states never told to wear CPAP  . Wears glasses     Past Surgical History:  Procedure Laterality Date  . APPENDECTOMY  age 55  . COLONOSCOPY W/ POLYPECTOMY  july 2015  . LUMBAR FUSION  04/ 2012   and rod  . PROSTATE BIOPSY    . RADIOACTIVE SEED IMPLANT N/A 04/06/2014   Procedure: RADIOACTIVE SEED IMPLANT;  Surgeon: [name], MD;  Location: [facility];  Service: Urology;  Laterality: N/A;  dr portable     There were no vitals filed for this visit.   Subjective Assessment - 10/19/18 1022    Subjective  pt is a 58 y.o M with CC of low back pain that has progressed to his neck that began 3  months ago with no specific onset. pt notes pain in the neck occurs mostly in laying with a sharp pain in the L shoulder. Reports hx of lumbar fusion in 2012 due to a fall. he reports no changes in the neck/ back pain.Marland Kitchen He reports intermittent N/T into the R foot, and reports N/T in to the L hand intermittently. He denies saddle paresthesia, and notes mild incontinence following raditoin for his prostate.     Pertinent History  hx of prostate cancer    Limitations  Standing;Lifting    How long can you sit comfortably?  10 min     How long can you stand comfortably?  15-20 min     How long can you walk comfortably?  1 hour    Diagnostic tests  cerivcal x-ray 01/17/2019    Patient Stated Goals  to decrease pain, return to the gym, improve function    Currently in Pain?  Yes    Pain Score  0-No pain   at worst 7/10   Pain Location  Neck    Pain Orientation  Left  Pain Descriptors / Indicators  Sharp    Pain Type  Chronic pain    Pain Onset  More than a month ago    Pain Frequency  Intermittent    Aggravating Factors   laying down    Pain Relieving Factors  medication, topical ointment, stretching raising the arms up    Effect of Pain on Daily Activities  limited    Multiple Pain Sites  Yes    Pain Score  2   at worst 5/10   Pain Location  Back    Pain Orientation  Right;Left;Lower;Mid    Pain Descriptors / Indicators  Aching;Sore;Tightness    Pain Type  Chronic pain    Pain Onset  More than a month ago    Pain Frequency  Constant    Aggravating Factors   standing/ walking, lifting and carrying, repetitive movement    Pain Relieving Factors  medication,     Effect of Pain on Daily Activities  limited standing/ walking         [facility] PT Assessment - 10/19/18 1025      Assessment   Medical Diagnosis  Degenerative disc disease, cervical , Degenerative , Muscle spasm of back, disc disease, lumbar    Referring Provider (PT)  Delia Chimes MD    Onset Date/Surgical Date  --   low back  in 2012, and neck 3 months    Hand Dominance  Left    Next MD Visit  --   make on PRN   Prior Therapy  yes      Precautions   Precautions  None      Restrictions   Weight Bearing Restrictions  No      Balance Screen   Has the patient fallen in the past 6 months  No    Has the patient had a decrease in activity level because of a fear of falling?   No    Is the patient reluctant to leave their home because of a fear of falling?   No      Home Environment   Living Environment  Private residence    Living Arrangements  Other relatives    Type of [place] Access  Level entry    Home Layout  One level    [place] - single point      Prior Function   Level of Independence  Independent with basic ADLs    Vocation  Full time employment   care taker   Vocation Requirements  standing/ walking/ lifting,       Cognition   Overall Cognitive Status  Within Functional Limits for tasks assessed      Observation/Other Assessments   Focus on Therapeutic Outcomes (FOTO)   32% limited   predicted 33% limited     Posture/Postural Control   Posture/Postural Control  Postural limitations    Postural Limitations  Rounded Shoulders;Forward head;Decreased lumbar lordosis      ROM / Strength   AROM / PROM / Strength  AROM;Strength      AROM   AROM Assessment Site  Cervical;Lumbar    Cervical Flexion  48    Cervical Extension  40    Cervical - Right Side Bend  30   ERP noted on the L side   Cervical - Left Side Bend  30   ERP noted on L    Cervical - Right Rotation  62    Cervical -  Left Rotation  41    Lumbar Flexion  80   ERP   Lumbar Extension  8   ERP   Lumbar - Right Side Bend  18    Lumbar - Left Side Bend  8   Lsided pain during moveemnt     Palpation   Palpation comment  TTP along the L lumbar parapspinals, and L upper trapp                Objective measurements completed on examination: See above findings.              PT  Education - 10/19/18 1029    Education Details  evaluation findings, POC, goals, HEp with proper form/ rationale.     Person(s) Educated  Patient    Methods  Explanation;Verbal cues;Handout    Comprehension  Verbalized understanding;Verbal cues required       PT Short Term Goals - 10/19/18 1247      PT SHORT TERM GOAL #1   Title  pt to be I with inital HEP     Time  3    Period  Weeks    Status  New    Target Date  11/09/18        PT Long Term Goals - 10/19/18 1247      PT LONG TERM GOAL #1   Title  pt to verbalize and demo proper posture and lifting mechanics to reduce and prevent neck/ low back     Time  6    Period  Weeks    Status  New    Target Date  11/30/18      PT LONG TERM GOAL #2   Title  increase turnk sidebending by to >/= 20 degrees and cervical sidebending and L rotation  by >/= 20 degrees with </= 2/10 pain for functional mobility required for ADLs    Time  6    Period  Weeks    Status  New    Target Date  11/30/18      PT LONG TERM GOAL #3   Title  pt to be able to return to personal gym routine reporting </= 2/10 pain for personal goals     Time  6    Period  Weeks    Status  New    Target Date  11/30/18      PT LONG TERM GOAL #4   Title  increase FOTO Score by >/= 10% to demo improvement in function     Time  6    Period  Weeks    Status  New    Target Date  11/30/18      PT LONG TERM GOAL #5   Title  pt to be I with all HEP given as of last visit to maintain and progress current level of function     Time  6    Period  Weeks    Status  New    Target Date  11/30/18             Plan - 10/19/18 1031    Clinical Impression Statement  pt presents to OPPT with CC of low back pain starting back in 2011 due to a fall and had a fusion, neck pain began 3 months ago with no MOI. he demonstrates functional mobility with limited cervical and lumbar sidebending. bil shoulder ROM is WFL. TTP along L upper trap and L lumbar paraspinals. he would  benefit from physical  therapy to reduce muscle spasm, promote efficient posture and lifting mechanics, promote neck/ trunk mobility by addressing the deficits listed.     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, unable to exercise    Clinical Presentation  Evolving    Clinical Presentation due to:  neck pain, low back pain, limited trunk ROM, limited cervical ROM, abnormal posture    Clinical Decision Making  Moderate    Rehab Potential  Good    PT Frequency  2x / week    PT Duration  6 weeks    PT Treatment/Interventions  ADLs/Self Care Home Management;Cryotherapy;Electrical Stimulation;Iontophoresis 94m/ml Dexamethasone;Moist Heat;Ultrasound;Therapeutic exercise;Therapeutic activities;Dry needling;Spinal Manipulations;Manual techniques;Balance training;Taping    PT Next Visit Plan  (NO E-STIM/ MHP HX OF CX) review and update HEP, posture education, STW along upper trap and L lumbar paraspinals, core strengthening.     PT Home Exercise Plan  chin tuck, upper trap stretch, lower trunk rotation, and supine marching.     Consulted and Agree with Plan of Care  Patient       Patient will benefit from skilled therapeutic intervention in order to improve the following deficits and impairments:  Pain, Increased fascial restricitons, Decreased strength, Postural dysfunction, Increased muscle spasms, Decreased range of motion, Improper body mechanics, Decreased activity tolerance, Decreased endurance  Visit Diagnosis: Cervicalgia  Chronic bilateral low back pain, unspecified whether sciatica present  Other muscle spasm     Problem List Patient Active Problem List   Diagnosis Date Noted  . Degenerative disc disease, lumbar 06/01/2016  . Degenerative disc disease, cervical 06/01/2016  . Insomnia due to other mental disorder 06/01/2016  . Adjustment disorder with mixed anxiety and depressed mood 06/01/2016  . Essential (primary)  hypertension 02/07/2015  . Malignant [place] 01/09/2014   KStarr LakePT, DPT, LAT, ATC  10/19/18  12:55 PM      [facility] [address]Phone: [phone]  Fax:  [phone] Name: NJaquay Posthumus MRN: [id number] of Birth: 903-07-1961       PHYSICAL THERAPY DISCHARGE SUMMARY  Visits from Start of Care: 1  Current functional level related to goals / functional outcomes: See goals   Remaining deficits: unknown   Education / Equipment: HEP  Plan:                                                    Patient goals were not met. Patient is being discharged due to not returning since the last visit.  ?????    Jakaree Pickard PT, DPT, LAT, ATC  11/08/18  1:44 PM

## 2018-11-04 ENCOUNTER — Other Ambulatory Visit: Payer: Self-pay | Admitting: Family Medicine

## 2018-12-19 DIAGNOSIS — Z1211 Encounter for screening for malignant neoplasm of colon: Secondary | ICD-10-CM | POA: Diagnosis not present

## 2019-02-02 ENCOUNTER — Telehealth: Payer: Self-pay | Admitting: Family Medicine

## 2019-02-02 DIAGNOSIS — Z1211 Encounter for screening for malignant neoplasm of colon: Secondary | ICD-10-CM | POA: Diagnosis not present

## 2019-02-02 DIAGNOSIS — K5730 Diverticulosis of large intestine without perforation or abscess without bleeding: Secondary | ICD-10-CM | POA: Diagnosis not present

## 2019-02-02 NOTE — Telephone Encounter (Signed)
Copied from [place] [phone] [id number]. Topic: Quick Communication - Rx Refill/Question >> Feb 02, 2019 10:22 AM Leward Quan A wrote: Medication: traZODone (DESYREL) 100 MG tablet  Has the patient contacted their pharmacy? Yes.   (Agent: If no, request that the patient contact the pharmacy for the refill.) (Agent: If yes, when and what did the pharmacy advise?)  Preferred Pharmacy (with phone number or street name): [phone] - [place], [place] - [address] AT [place] [phone] (Phone) [phone] (Fax    Agent: Please be advised that RX refills may take up to 3 business days. We ask that you follow-up with your pharmacy.

## 2019-02-02 NOTE — Telephone Encounter (Signed)
Copied from [place] [id number]. 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 Please advise Ph# [phone]

## 2019-02-03 ENCOUNTER — Other Ambulatory Visit: Payer: Self-pay

## 2019-02-03 DIAGNOSIS — F5105 Insomnia due to other mental disorder: Secondary | ICD-10-CM

## 2019-02-03 MED ORDER — TRAZODONE HCL 100 MG PO TABS: 100.0000 mg | ORAL_TABLET | Freq: Every day | ORAL | 1 refills | Status: DC

## 2019-02-03 NOTE — Telephone Encounter (Signed)
Called pt and informed him at this time that we are not offering the antibody testing and when we do have it available someone will give him a call. Also I have informed pt that Rx has been filled.

## 2019-04-03 DIAGNOSIS — C61 Malignant neoplasm of prostate: Secondary | ICD-10-CM | POA: Diagnosis not present

## 2019-04-03 DIAGNOSIS — Z8546 Personal history of malignant neoplasm of prostate: Secondary | ICD-10-CM | POA: Diagnosis not present

## 2019-04-03 DIAGNOSIS — R69 Illness, unspecified: Secondary | ICD-10-CM | POA: Diagnosis not present

## 2019-04-05 DIAGNOSIS — R7989 Other specified abnormal findings of blood chemistry: Secondary | ICD-10-CM | POA: Diagnosis not present

## 2019-04-07 ENCOUNTER — Other Ambulatory Visit: Payer: Self-pay | Admitting: Family Medicine

## 2019-04-07 MED ORDER — AMLODIPINE BESYLATE 10 MG PO TABS: 10.0000 mg | ORAL_TABLET | Freq: Every day | ORAL | 0 refills | Status: DC

## 2019-04-07 MED ORDER — GABAPENTIN 300 MG PO CAPS: 900.0000 mg | ORAL_CAPSULE | Freq: Three times a day (TID) | ORAL | 5 refills | Status: DC

## 2019-04-07 NOTE — Telephone Encounter (Signed)
Please see previous note. Pt also requesting a refill of Tramadol 50 mg which is not on current med list.

## 2019-04-07 NOTE — Telephone Encounter (Signed)
Requested medication (s) are due for refill today: yes  Requested medication (s) are on the active medication list: yes with the exception of Tramadol  Last refill: Ambien 03/18/18 #30; Zanaflex 04/06/18 #90; Meloxicam 03/19/18    Future visit scheduled: No  Notes to clinic:  Unable to refill per protocol. Meloxicam previously filled by a different provider. Pt also requesting a refill of Tramadol which is not on current med list.    Requested Prescriptions  Pending Prescriptions Disp Refills   zolpidem (AMBIEN) 10 MG tablet 30 tablet 1    Sig: take 1 tablet by mouth at bedtime if needed for sleep     Not Delegated - Psychiatry:  Anxiolytics/Hypnotics Failed - 04/07/2019 11:02 AM      Failed - This refill cannot be delegated      Failed - Urine Drug Screen completed in last 360 days.      Failed - Valid encounter within last 6 months    Recent Outpatient Visits          6 months ago Encounter for Medicare annual wellness exam   Primary Care at [facility], Arlie Solomons, MD   9 months ago Essential hypertension   Primary Care at [facility], [place] A, MD   11 months ago Gastroesophageal reflux disease with esophagitis   Primary Care at [facility], Arlie Solomons, MD   1 year ago Essential (primary) hypertension   Primary Care at [facility], Arlie Solomons, MD   1 year ago Neck pain   Primary Care at [facility], Renette Butters, MD              tiZANidine (ZANAFLEX) 4 MG capsule 90 capsule 5    Sig: Take 1 capsule (4 mg total) by mouth 3 (three) times daily as needed for muscle spasms.     Not Delegated - Cardiovascular:  Alpha-2 Agonists - tizanidine Failed - 04/07/2019 11:02 AM      Failed - This refill cannot be delegated      Failed - Valid encounter within last 6 months    Recent Outpatient Visits          6 months ago Encounter for Medicare annual wellness exam   Primary Care at [facility], Arlie Solomons, MD   9 months ago Essential hypertension   Primary Care at [facility], [place] A, MD   11 months ago Gastroesophageal reflux disease with esophagitis   Primary Care at [facility], Arlie Solomons, MD   1 year ago Essential (primary) hypertension   Primary Care at [facility], [place] A, MD   1 year ago Neck pain   Primary Care at [facility], Renette Butters, MD              meloxicam (MOBIC) 15 MG tablet 90 tablet 1    Sig: Take 1 tablet (15 mg total) by mouth daily as needed for pain.     Analgesics:  COX2 Inhibitors Failed - 04/07/2019 11:02 AM      Failed - HGB in normal range and within 360 days    Hemoglobin  Date Value Ref Range Status  12/29/2017 15.6 13.0 - 17.7 g/dL Final         Passed - Cr in normal range and within 360 days    Creat  Date Value Ref Range Status  05/06/2016 1.07 0.70 - 1.33 mg/dL Final    Comment:      For patients > or = 60 years of age:  The upper reference limit for Creatinine is approximately 13% higher for people identified as African-American.      Creatinine, Ser  Date Value Ref Range Status  09/15/2018 1.07 0.76 - 1.27 mg/dL Final         Passed - Patient is not pregnant      Passed - Valid encounter within last 12 months    Recent Outpatient Visits          6 months ago Encounter for [employer] annual wellness exam   Primary Care at [facility], Arlie Solomons, MD   9 months ago Essential hypertension   Primary Care at [facility], [place] A, MD   11 months ago Gastroesophageal reflux disease with esophagitis   Primary Care at [facility], Arlie Solomons, MD   1 year ago Essential (primary) hypertension   Primary Care at [facility], Arlie Solomons, MD   1 year ago Neck pain   Primary Care at [facility], Renette Butters, MD             Signed Prescriptions Disp Refills   amLODipine (NORVASC) 10 MG tablet 30 tablet 0    Sig: Take 1 tablet (10 mg total) by mouth daily.     Cardiovascular:  Calcium Channel Blockers Failed - 04/07/2019 11:02 AM      Failed - Last BP in normal range    BP Readings from Last 1  Encounters:  09/15/18 (!) 146/91         Failed - Valid encounter within last 6 months    Recent Outpatient Visits          6 months ago Encounter for [employer] annual wellness exam   Primary Care at [facility], Arlie Solomons, MD   9 months ago Essential hypertension   Primary Care at [facility], [place] A, MD   11 months ago Gastroesophageal reflux disease with esophagitis   Primary Care at [facility], Arlie Solomons, MD   1 year ago Essential (primary) hypertension   Primary Care at [facility], [place] A, MD   1 year ago Neck pain   Primary Care at [facility], Renette Butters, MD              gabapentin (NEURONTIN) 300 MG capsule 270 capsule 5    Sig: Take 3 capsules (900 mg total) by mouth 3 (three) times daily.     Neurology: Anticonvulsants - gabapentin Passed - 04/07/2019 11:02 AM      Passed - Valid encounter within last 12 months    Recent Outpatient Visits          6 months ago Encounter for Medicare annual wellness exam   Primary Care at [facility], Arlie Solomons, MD   9 months ago Essential hypertension   Primary Care at [facility], [place] A, MD   11 months ago Gastroesophageal reflux disease with esophagitis   Primary Care at [facility], Arlie Solomons, MD   1 year ago Essential (primary) hypertension   Primary Care at [facility], Arlie Solomons, MD   1 year ago Neck pain   Primary Care at [facility], Renette Butters, MD

## 2019-04-07 NOTE — Telephone Encounter (Signed)
Medication:  zolpidem (AMBIEN) 10 MG tablet amLODipine (NORVASC) 10 MG tablet  tiZANidine (ZANAFLEX) 4 MG capsule  meloxicam (MOBIC) 15 MG tablet  gabapentin (NEURONTIN) 300 MG capsule  traMADol (ULTRAM) 50 MG tablet    Patient is requesting refills.    Pharmacy:  [facility] [place], [place] - [address] AT [place] [phone] (Phone) [phone] (Fax

## 2019-05-04 DIAGNOSIS — Z8042 Family history of malignant neoplasm of prostate: Secondary | ICD-10-CM | POA: Insufficient documentation

## 2019-05-04 DIAGNOSIS — N3941 Urge incontinence: Secondary | ICD-10-CM | POA: Diagnosis not present

## 2019-05-04 DIAGNOSIS — R69 Illness, unspecified: Secondary | ICD-10-CM | POA: Diagnosis not present

## 2019-05-04 DIAGNOSIS — R3915 Urgency of urination: Secondary | ICD-10-CM | POA: Insufficient documentation

## 2019-05-04 DIAGNOSIS — G4719 Other hypersomnia: Secondary | ICD-10-CM | POA: Insufficient documentation

## 2019-05-04 DIAGNOSIS — R0683 Snoring: Secondary | ICD-10-CM | POA: Insufficient documentation

## 2019-05-04 DIAGNOSIS — R7989 Other specified abnormal findings of blood chemistry: Secondary | ICD-10-CM | POA: Diagnosis not present

## 2019-05-04 DIAGNOSIS — N5236 Erectile dysfunction following interstitial seed therapy: Secondary | ICD-10-CM | POA: Insufficient documentation

## 2019-05-04 DIAGNOSIS — N323 Diverticulum of bladder: Secondary | ICD-10-CM | POA: Insufficient documentation

## 2019-05-04 DIAGNOSIS — R6882 Decreased libido: Secondary | ICD-10-CM | POA: Insufficient documentation

## 2019-05-04 DIAGNOSIS — F17200 Nicotine dependence, unspecified, uncomplicated: Secondary | ICD-10-CM | POA: Insufficient documentation

## 2019-05-04 DIAGNOSIS — E538 Deficiency of other specified B group vitamins: Secondary | ICD-10-CM | POA: Diagnosis not present

## 2019-05-04 DIAGNOSIS — Z8546 Personal history of malignant neoplasm of prostate: Secondary | ICD-10-CM | POA: Diagnosis not present

## 2019-05-09 DIAGNOSIS — R69 Illness, unspecified: Secondary | ICD-10-CM | POA: Diagnosis not present

## 2019-06-16 DIAGNOSIS — Z8042 Family history of malignant neoplasm of prostate: Secondary | ICD-10-CM | POA: Diagnosis not present

## 2019-06-16 DIAGNOSIS — R7989 Other specified abnormal findings of blood chemistry: Secondary | ICD-10-CM | POA: Diagnosis not present

## 2019-06-16 DIAGNOSIS — Z8546 Personal history of malignant neoplasm of prostate: Secondary | ICD-10-CM | POA: Diagnosis not present

## 2019-06-16 DIAGNOSIS — E538 Deficiency of other specified B group vitamins: Secondary | ICD-10-CM | POA: Diagnosis not present

## 2019-06-16 DIAGNOSIS — N5236 Erectile dysfunction following interstitial seed therapy: Secondary | ICD-10-CM | POA: Diagnosis not present

## 2019-06-16 DIAGNOSIS — R69 Illness, unspecified: Secondary | ICD-10-CM | POA: Diagnosis not present

## 2019-06-16 DIAGNOSIS — N3941 Urge incontinence: Secondary | ICD-10-CM | POA: Diagnosis not present

## 2019-06-16 DIAGNOSIS — E6609 Other obesity due to excess calories: Secondary | ICD-10-CM | POA: Diagnosis not present

## 2019-06-16 DIAGNOSIS — N323 Diverticulum of bladder: Secondary | ICD-10-CM | POA: Diagnosis not present

## 2019-07-03 ENCOUNTER — Other Ambulatory Visit: Payer: Self-pay

## 2019-07-03 DIAGNOSIS — Z20822 Contact with and (suspected) exposure to covid-19: Secondary | ICD-10-CM

## 2019-07-05 LAB — NOVEL CORONAVIRUS, NAA: SARS-CoV-2, NAA: NOT DETECTED

## 2019-08-11 ENCOUNTER — Other Ambulatory Visit: Payer: Self-pay | Admitting: Family Medicine

## 2019-08-11 ENCOUNTER — Other Ambulatory Visit: Payer: Self-pay

## 2019-08-11 DIAGNOSIS — F5105 Insomnia due to other mental disorder: Secondary | ICD-10-CM

## 2019-08-11 DIAGNOSIS — Z20822 Contact with and (suspected) exposure to covid-19: Secondary | ICD-10-CM

## 2019-08-11 DIAGNOSIS — F99 Mental disorder, not otherwise specified: Secondary | ICD-10-CM

## 2019-08-11 NOTE — Telephone Encounter (Signed)
Requested medication (s) are due for refill today: yes  Requested medication (s) are on the active medication list: yes  Last refill:  02/23/2019  Future visit scheduled: no  Notes to clinic:  overdue for office visit  Review for refill   Requested Prescriptions  Pending Prescriptions Disp Refills   traZODone (DESYREL) 100 MG tablet 90 tablet 1    Sig: Take 1 tablet (100 mg total) by mouth at bedtime.      Psychiatry: Antidepressants - Serotonin Modulator Failed - 08/11/2019 12:25 PM      Failed - Valid encounter within last 6 months    Recent Outpatient Visits           11 months ago Encounter for Medicare annual wellness exam   Primary Care at [facility], Arlie Solomons, MD   1 year ago Essential hypertension   Primary Care at [facility] [place], MD   1 year ago Gastroesophageal reflux disease with esophagitis   Primary Care at [facility], Arlie Solomons, MD   1 year ago Essential (primary) hypertension   Primary Care at [facility], [place] A, MD   1 year ago Neck pain   Primary Care at [facility], Renette Butters, MD                meloxicam (MOBIC) 15 MG tablet 90 tablet 1    Sig: Take 1 tablet (15 mg total) by mouth daily as needed for pain.      Analgesics:  COX2 Inhibitors Failed - 08/11/2019 12:25 PM      Failed - HGB in normal range and within 360 days    Hemoglobin  Date Value Ref Range Status  12/29/2017 15.6 13.0 - 17.7 g/dL Final          Passed - Cr in normal range and within 360 days    Creat  Date Value Ref Range Status  05/06/2016 1.07 0.70 - 1.33 mg/dL Final    Comment:      For patients > or = 60 years of age: The upper reference limit for Creatinine is approximately 13% higher for people identified as African-American.      Creatinine, Ser  Date Value Ref Range Status  09/15/2018 1.07 0.76 - 1.27 mg/dL Final          Passed - Patient is not pregnant      Passed - Valid encounter within last 12 months    Recent Outpatient Visits            11 months ago Encounter for [employer] annual wellness exam   Primary Care at [facility], Arlie Solomons, MD   1 year ago Essential hypertension   Primary Care at [facility], [place], MD   1 year ago Gastroesophageal reflux disease with esophagitis   Primary Care at [facility], Arlie Solomons, MD   1 year ago Essential (primary) hypertension   Primary Care at [facility], Arlie Solomons, MD   1 year ago Neck pain   Primary Care at [facility], Renette Butters, MD

## 2019-08-11 NOTE — Telephone Encounter (Signed)
Medication Refill - Medication: traZODone (DESYREL) 100 MG tablet meloxicam (MOBIC) 15 MG tablet  Has the patient contacted their pharmacy? Yes - states that he requested on Monday and still hasn't heard anything (Agent: If no, request that the patient contact the pharmacy for the refill.) (Agent: If yes, when and what did the pharmacy advise?)  Preferred Pharmacy (with phone number or street name):  [facility] [phone] - [place], [place] AT [address] Phone:  [phone]  Fax:  [phone]     Agent: Please be advised that RX refills may take up to 3 business days. We ask that you follow-up with your pharmacy.

## 2019-08-11 NOTE — Telephone Encounter (Signed)
Pt is aware dr Nolon Rod is not in office today and he would like to know if another md could authorize refill today

## 2019-08-12 LAB — NOVEL CORONAVIRUS, NAA: SARS-CoV-2, NAA: NOT DETECTED

## 2019-08-14 ENCOUNTER — Other Ambulatory Visit: Payer: Self-pay

## 2019-08-14 ENCOUNTER — Telehealth (INDEPENDENT_AMBULATORY_CARE_PROVIDER_SITE_OTHER): Payer: Medicare HMO | Admitting: Family Medicine

## 2019-08-14 DIAGNOSIS — Z8546 Personal history of malignant neoplasm of prostate: Secondary | ICD-10-CM

## 2019-08-14 DIAGNOSIS — F99 Mental disorder, not otherwise specified: Secondary | ICD-10-CM

## 2019-08-14 DIAGNOSIS — R0689 Other abnormalities of breathing: Secondary | ICD-10-CM | POA: Diagnosis not present

## 2019-08-14 DIAGNOSIS — F5105 Insomnia due to other mental disorder: Secondary | ICD-10-CM | POA: Diagnosis not present

## 2019-08-14 DIAGNOSIS — E538 Deficiency of other specified B group vitamins: Secondary | ICD-10-CM

## 2019-08-14 DIAGNOSIS — I10 Essential (primary) hypertension: Secondary | ICD-10-CM

## 2019-08-14 DIAGNOSIS — R69 Illness, unspecified: Secondary | ICD-10-CM | POA: Diagnosis not present

## 2019-08-14 DIAGNOSIS — R0683 Snoring: Secondary | ICD-10-CM

## 2019-08-14 MED ORDER — ZOLPIDEM TARTRATE 10 MG PO TABS: ORAL_TABLET | ORAL | 1 refills | Status: DC

## 2019-08-14 MED ORDER — MELOXICAM 15 MG PO TABS: 15.0000 mg | ORAL_TABLET | Freq: Every day | ORAL | 1 refills | Status: DC | PRN

## 2019-08-14 MED ORDER — GABAPENTIN 300 MG PO CAPS: 900.0000 mg | ORAL_CAPSULE | Freq: Three times a day (TID) | ORAL | 5 refills | Status: DC

## 2019-08-14 MED ORDER — TRAZODONE HCL 100 MG PO TABS: 100.0000 mg | ORAL_TABLET | Freq: Every day | ORAL | 1 refills | Status: DC

## 2019-08-14 NOTE — Progress Notes (Signed)
Virtual Visit Note  I connected with patient on 08/14/19 at 524pm by phone and verified that I am speaking with the correct person using two identifiers. Justin Dunn. is currently located at home and patient is currently with them during visit. The provider, Rutherford Guys, MD is located in their office at time of visit.  I discussed the limitations, risks, security and privacy concerns of performing an evaluation and management service by telephone and the availability of in person appointments. I also discussed with the patient that there may be a patient responsible charge related to this service. The patient expressed understanding and agreed to proceed.   CC: med refills  HPI  PCP Dr Nolon Rod?  PMH: DDD lumbar and cervical spine, HTN, prostate cancer, BPH,  Insomnia, b12 deficiency, smoker  Needs refill meloxicam, gabapentin - takes for DDD  Has history of spine surgery Takes as needed Overall stable  Needs refill of ambien, trazodone - takes for insomnia Takes prn pmp reviewed  He has noticed occasional times when he wakes up gasping for breath after feeling reflux in his throat Had pizza and chicken wings for dinner  Sees urologist at [facility] He was giving him b12 injections and started on anazatrole and clomid He was worried about OSA due to reported snoring He has a brother you uses a cpap Requesting PSA to be checked as has not had lab work in a while  Will be leaving tomorrow to family wedding  Not ready to quit smoking Uses THC, feels it helps him with anxiety, sleep and pain  No Known Allergies  Prior to Admission medications   Medication Sig Start Date End Date Taking? Authorizing Provider  amLODipine (NORVASC) 10 MG tablet Take 1 tablet (10 mg total) by mouth daily. 04/07/19  Yes Delia Chimes A, MD  b complex vitamins tablet Take 1 tablet by mouth daily.   Yes [provider]  Calcium-Cholecalciferol (VITA-CALCIUM PO) Take by mouth.   Yes  [provider]  Cholecalciferol (VITAMIN D3) 1000 units CAPS Take by mouth.   Yes [provider]  enalapril (VASOTEC) 20 MG tablet Take 1 tablet (20 mg total) by mouth daily. 09/15/18  Yes Stallings, Zoe A, MD  fluticasone (FLONASE) 50 MCG/ACT nasal spray Place 2 sprays into both nostrils daily. 09/20/17  Yes Wardell Honour, MD  gabapentin (NEURONTIN) 300 MG capsule Take 3 capsules (900 mg total) by mouth 3 (three) times daily. 04/07/19  Yes Forrest Moron, MD  meloxicam (MOBIC) 15 MG tablet Take 1 tablet (15 mg total) by mouth daily as needed for pain. 03/19/18  Yes Wardell Honour, MD  Multiple Vitamins-Minerals (ZINC PO) Take by mouth.   Yes [provider]  Multiple Vitamins-Minerals-FA (VITATAB MV PO) Take by mouth.   Yes [provider]  Olopatadine HCl (PATADAY) 0.2 % SOLN Apply 1 drop to eye daily. 09/15/18  Yes Forrest Moron, MD  saw palmetto 160 MG capsule Take 160 mg by mouth 2 (two) times daily.   Yes [provider]  tiZANidine (ZANAFLEX) 4 MG capsule Take 1 capsule (4 mg total) by mouth 3 (three) times daily as needed for muscle spasms. 04/06/18  Yes Forrest Moron, MD  traZODone (DESYREL) 100 MG tablet Take 1 tablet (100 mg total) by mouth at bedtime. 02/03/19  Yes Stallings, Zoe A, MD  triamcinolone ointment (KENALOG) 0.5 % Apply 1 application topically 2 (two) times daily. 01/29/17  Yes Lorin Glass, PA-C  Turmeric (RA TURMERIC) 500 MG CAPS Take 1 tablet by mouth. Reported on 08/19/2015   Yes [provider]  zolpidem (AMBIEN) 10 MG tablet take 1 tablet by mouth at bedtime if needed for sleep 03/08/18  Yes Wardell Honour, MD  nicotine (NICODERM CQ - DOSED IN MG/24 HR) 7 mg/24hr patch Place 1 patch (7 mg total) onto the skin daily. Patient not taking: Reported on 08/14/2019 09/15/18   Forrest Moron, MD    Past Medical History:  Diagnosis Date  . Anxiety   . At risk for sleep apnea    STOP-BANG= 4   SENT TO PCP  04-02-2014  . Chronic low back pain   . Depression   . ED (erectile dysfunction)   . History of colon polyps   . History of gastric ulcer    as child  . Hypertension   . Hypogonadism male   . Incomplete bladder emptying   . Lumbar spine scoliosis   . Premature ejaculation   . Prostate cancer [facility])    Dr Tammi Klippel ( oncologist) and Dr Loel Lofty [facility])   . Retention of urine, unspecified   . Sleep apnea    states never told to wear CPAP  . Wears glasses     Past Surgical History:  Procedure Laterality Date  . APPENDECTOMY  age 78  . COLONOSCOPY W/ POLYPECTOMY  july 2015  . LUMBAR FUSION  04/ 2012   and rod  . PROSTATE BIOPSY    . RADIOACTIVE SEED IMPLANT N/A 04/06/2014   Procedure: RADIOACTIVE SEED IMPLANT;  Surgeon: [name], MD;  Location: [facility];  Service: Urology;  Laterality: N/A;  dr portable     Social History   Tobacco Use  . Smoking status: Current Some Day Smoker    Packs/day: 1.00    Years: 10.00    Pack years: 10.00    Types: Cigarettes, Cigars  . Smokeless tobacco: Never Used  . Tobacco comment: currently smokes 2 small cigar per day/  quit cigarettes 2011  Substance Use Topics  . Alcohol use: Yes    Alcohol/week: 0.0 standard drinks    Comment: 1-2 glasses of wine daily    Family History  Problem Relation Age of Onset  . Prostate cancer Father   . Hypertension Mother   . Pulmonary fibrosis Mother   . Arthritis Mother   . Diabetes Mother   . Deep vein thrombosis Sister     Review of Systems  Constitutional: Negative for chills and fever.  Respiratory: Negative for cough and shortness of breath.   Cardiovascular: Negative for chest pain, palpitations and leg swelling.  Gastrointestinal: Negative for abdominal pain, nausea and vomiting.    Objective  Vitals as reported by the patient: none   ASSESSMENT and PLAN  1. Insomnia due to other mental disorder Controlled. Continue current regime. pmp reviewed -  traZODone (DESYREL) 100 MG tablet; Take 1 tablet (100 mg total) by mouth at bedtime. - zolpidem (AMBIEN) 10 MG tablet; take 1 tablet by mouth at bedtime if needed for sleep  2. Snoring 3. Gasping for breath - Ambulatory referral to Sleep Studies  4. Vitamin B12 deficiency (non anemic) Checking labs today, medications will be adjusted as needed.  - Vitamin B12; Future  5. Essential (primary) hypertension Stable. Cont current mgt. Nurse BP check when he comes in for labs - Comprehensive metabolic panel; Future - Lipid panel; Future  6. Personal history of prostate cancer Managed by urology,  patient requesting labs - PSA; Future  Other orders - Multiple Vitamins-Minerals (ZINC PO); Take by mouth. - gabapentin (NEURONTIN) 300 MG capsule; Take 3 capsules (900 mg total) by mouth 3 (three) times daily. - meloxicam (MOBIC) 15 MG tablet; Take 1 tablet (15 mg total) by mouth daily as needed for pain.  FOLLOW-UP: 6 months with PCP    The above assessment and management plan was discussed with the patient. The patient verbalized understanding of and has agreed to the management plan. Patient is aware to call the clinic if symptoms persist or worsen. Patient is aware when to return to the clinic for a follow-up visit. Patient educated on when it is appropriate to go to the emergency department.    I provided 22 minutes of non-face-to-face time during this encounter.  Rutherford Guys, MD Primary Care at [facility] [address], [place] [address] Ph.  [phone] Fax [phone]

## 2019-08-14 NOTE — Progress Notes (Signed)
Pt is needing refill on the pended meds. He is also wanting to talk about smoking cessation methods. He did not use the nicoderm. He is also requesting referral for uro and sleep study Reports that some nights, does not happen often but he does wake up gasping for air. He also asked for copy of his result for covid. The letter has been printed and put at the front desk for pick up

## 2019-09-14 DIAGNOSIS — R7989 Other specified abnormal findings of blood chemistry: Secondary | ICD-10-CM | POA: Diagnosis not present

## 2019-09-14 DIAGNOSIS — N323 Diverticulum of bladder: Secondary | ICD-10-CM | POA: Diagnosis not present

## 2019-09-14 DIAGNOSIS — N5236 Erectile dysfunction following interstitial seed therapy: Secondary | ICD-10-CM | POA: Diagnosis not present

## 2019-09-14 DIAGNOSIS — E6609 Other obesity due to excess calories: Secondary | ICD-10-CM | POA: Diagnosis not present

## 2019-09-14 DIAGNOSIS — N3941 Urge incontinence: Secondary | ICD-10-CM | POA: Diagnosis not present

## 2019-09-14 DIAGNOSIS — E538 Deficiency of other specified B group vitamins: Secondary | ICD-10-CM | POA: Diagnosis not present

## 2019-09-14 DIAGNOSIS — R69 Illness, unspecified: Secondary | ICD-10-CM | POA: Diagnosis not present

## 2019-09-14 DIAGNOSIS — Z8042 Family history of malignant neoplasm of prostate: Secondary | ICD-10-CM | POA: Diagnosis not present

## 2019-09-14 DIAGNOSIS — Z8546 Personal history of malignant neoplasm of prostate: Secondary | ICD-10-CM | POA: Diagnosis not present

## 2019-09-22 DIAGNOSIS — N3944 Nocturnal enuresis: Secondary | ICD-10-CM | POA: Diagnosis not present

## 2019-09-22 DIAGNOSIS — E538 Deficiency of other specified B group vitamins: Secondary | ICD-10-CM | POA: Diagnosis not present

## 2019-09-22 DIAGNOSIS — R7989 Other specified abnormal findings of blood chemistry: Secondary | ICD-10-CM | POA: Diagnosis not present

## 2019-09-22 DIAGNOSIS — R5383 Other fatigue: Secondary | ICD-10-CM | POA: Diagnosis not present

## 2019-09-22 DIAGNOSIS — R69 Illness, unspecified: Secondary | ICD-10-CM | POA: Diagnosis not present

## 2019-10-16 DIAGNOSIS — N3944 Nocturnal enuresis: Secondary | ICD-10-CM | POA: Diagnosis not present

## 2019-10-16 DIAGNOSIS — C61 Malignant neoplasm of prostate: Secondary | ICD-10-CM | POA: Diagnosis not present

## 2019-10-16 DIAGNOSIS — Z8042 Family history of malignant neoplasm of prostate: Secondary | ICD-10-CM | POA: Diagnosis not present

## 2019-10-16 DIAGNOSIS — N323 Diverticulum of bladder: Secondary | ICD-10-CM | POA: Diagnosis not present

## 2019-10-16 DIAGNOSIS — N5236 Erectile dysfunction following interstitial seed therapy: Secondary | ICD-10-CM | POA: Diagnosis not present

## 2019-10-23 ENCOUNTER — Ambulatory Visit: Payer: Medicare HMO | Admitting: Registered Nurse

## 2019-10-27 ENCOUNTER — Ambulatory Visit (INDEPENDENT_AMBULATORY_CARE_PROVIDER_SITE_OTHER): Payer: Medicare HMO | Admitting: Registered Nurse

## 2019-10-27 ENCOUNTER — Other Ambulatory Visit: Payer: Self-pay

## 2019-10-27 ENCOUNTER — Encounter: Payer: Self-pay | Admitting: Registered Nurse

## 2019-10-27 VITALS — BP 156/84 | HR 94 | Temp 98.0°F | Ht 70.0 in | Wt 220.4 lb

## 2019-10-27 DIAGNOSIS — I10 Essential (primary) hypertension: Secondary | ICD-10-CM

## 2019-10-27 DIAGNOSIS — F5105 Insomnia due to other mental disorder: Secondary | ICD-10-CM

## 2019-10-27 DIAGNOSIS — F99 Mental disorder, not otherwise specified: Secondary | ICD-10-CM

## 2019-10-27 DIAGNOSIS — R69 Illness, unspecified: Secondary | ICD-10-CM | POA: Diagnosis not present

## 2019-10-27 DIAGNOSIS — M5136 Other intervertebral disc degeneration, lumbar region: Secondary | ICD-10-CM | POA: Diagnosis not present

## 2019-10-27 DIAGNOSIS — F411 Generalized anxiety disorder: Secondary | ICD-10-CM

## 2019-10-27 DIAGNOSIS — F410 Panic disorder [episodic paroxysmal anxiety] without agoraphobia: Secondary | ICD-10-CM

## 2019-10-27 MED ORDER — GABAPENTIN 300 MG PO CAPS: 900.0000 mg | ORAL_CAPSULE | Freq: Three times a day (TID) | ORAL | 5 refills | Status: DC

## 2019-10-27 MED ORDER — ESCITALOPRAM OXALATE 10 MG PO TABS: 10.0000 mg | ORAL_TABLET | Freq: Every day | ORAL | 0 refills | Status: DC

## 2019-10-27 MED ORDER — ZOLPIDEM TARTRATE 10 MG PO TABS: ORAL_TABLET | ORAL | 1 refills | Status: DC

## 2019-10-27 MED ORDER — ALPRAZOLAM 0.25 MG PO TBDP: 0.2500 mg | ORAL_TABLET | Freq: Two times a day (BID) | ORAL | 0 refills | Status: DC | PRN

## 2019-10-27 MED ORDER — ENALAPRIL MALEATE 20 MG PO TABS: 20.0000 mg | ORAL_TABLET | Freq: Two times a day (BID) | ORAL | 1 refills | Status: DC

## 2019-10-27 NOTE — Patient Instructions (Signed)
Mr. Duxbury -   Doristine Devoid to speak with you today. I hope you continue to feel better - you've done a lot of good.  For changes, I want to have you start on a couple of medications:  Escitalopram: Please take 1 tablet (10mg ) each morning. This medication works best when taken every day. It may take 4 weeks or so to see full effect. If you miss it, take it as soon as you remember.  Alprazolam: Please take 1 tablet (0.25mg ) sublingually twice daily as needed for breakthrough anxiety.   Enalapril: Please take 2 tablet (20mg ) twice daily to help control your blood pressure. As you will run out sooner taking two a day, I have sent in a refill of this medication.   I would like to have you do a follow up visit in 1 month with either myself or your PCP Dr. Nolon Rod. I would like this to be an in person visit so we can keep an eye on your blood pressure.   Please feel free to reach out with any questions.

## 2019-11-04 NOTE — Progress Notes (Signed)
Established Patient Office Visit  Subjective:  Patient ID: Justin Sexson., male    DOB: July 01, 1961  Age: 59 y.o. MRN: [id number]  CC:  Chief Complaint  Patient presents with  . Panic Attack    patient states he think his pressure is going on because he had to send his son to mental health. patient felt like he has been having an anxiety attacks now . patient has not been getting any sleep. Paer patient he just would like to make sure he is doing okay at this time. PHQ9=11 GAD=8    HPI Justin Dunn. 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 Since relinquishing he individual to an inpatient mental health facility, he has been very anxious with sleep disturbances and panic attacks Denies HI/SI Has concerns for his family's health as well  Past Medical History:  Diagnosis Date  . Anxiety   . At risk for sleep apnea    STOP-BANG= 4   SENT TO PCP 04-02-2014  . Chronic low back pain   . Depression   . ED (erectile dysfunction)   . History of colon polyps   . History of gastric ulcer    as child  . Hypertension   . Hypogonadism male   . Incomplete bladder emptying   . Lumbar spine scoliosis   . Premature ejaculation   . Prostate cancer [facility])    Dr Tammi Klippel ( oncologist) and Dr Loel Lofty [facility])   . Retention of urine, unspecified   . Sleep apnea    states never told to wear CPAP  . Wears glasses     Past Surgical History:  Procedure Laterality Date  . APPENDECTOMY  age 41  . COLONOSCOPY W/ POLYPECTOMY  july 2015  . LUMBAR FUSION  04/ 2012   and rod  . PROSTATE BIOPSY    . RADIOACTIVE SEED IMPLANT N/A 04/06/2014   Procedure: RADIOACTIVE SEED IMPLANT;  Surgeon: [name], MD;  Location: [facility];   Service: Urology;  Laterality: N/A;  dr portable     Family History  Problem Relation Age of Onset  . Prostate cancer Father   . Hypertension Mother   . Pulmonary fibrosis Mother   . Arthritis Mother   . Diabetes Mother   . Deep vein thrombosis Sister     Social History   Socioeconomic History  . Marital status: Divorced    Spouse name: Not on file  . Number of children: Not on file  . Years of education: Not on file  . Highest education level: Some college, no degree  Occupational History  . Occupation: disability    Comment: DDD lumbar spine; 2013  Tobacco Use  . Smoking status: Current Some Day Smoker    Packs/day: 1.00    Years: 10.00    Pack years: 10.00    Types: Cigarettes, Cigars  . Smokeless tobacco: Never Used  . Tobacco comment: currently smokes 2 small cigar per day/  quit cigarettes 2011  Substance and Sexual Activity  . Alcohol use: Yes    Alcohol/week: 0.0 standard drinks    Comment: 1-2 glasses of wine daily  . Drug use: Yes    Types: Marijuana  . Sexual activity: Never  Birth control/protection: Abstinence  Other Topics Concern  . Not on file  Social History Narrative   Marital status: divorced; dating in 2019      Children: one foster son (autistic_      Lives: with foster son      Employment: disability from DDD lumbar      Tobacco: none      Alcohol: none      Drugs:      Exercise: minimal in 2019.      ADLs; drives; independent with ADLs.      Advanced Directives: none; FULL CODE.  Johnsie Cancel [place]/[phone] [employer] [phone].   Social Determinants of Health   Financial Resource Strain:   . Difficulty of Paying Living Expenses: Not on file  Food Insecurity:   . Worried About Charity fundraiser in the Last Year: Not on file  . Ran Out of Food in the Last Year: Not on file  Transportation Needs:   . Lack of Transportation (Medical): Not on file  . Lack of Transportation (Non-Medical): Not on file  Physical  Activity:   . Days of Exercise per Week: Not on file  . Minutes of Exercise per Session: Not on file  Stress:   . Feeling of Stress : Not on file  Social Connections:   . Frequency of Communication with Friends and Family: Not on file  . Frequency of Social Gatherings with Friends and Family: Not on file  . Attends Religious Services: Not on file  . Active Member of Clubs or Organizations: Not on file  . Attends Archivist Meetings: Not on file  . Marital Status: Not on file  Intimate Partner Violence:   . Fear of Current or Ex-Partner: Not on file  . Emotionally Abused: Not on file  . Physically Abused: Not on file  . Sexually Abused: Not on file    Outpatient Medications Prior to Visit  Medication Sig Dispense Refill  . amLODipine (NORVASC) 10 MG tablet Take 1 tablet (10 mg total) by mouth daily. 30 tablet 0  . b complex vitamins tablet Take 1 tablet by mouth daily.    . Calcium-Cholecalciferol (VITA-CALCIUM PO) Take by mouth.    . Cholecalciferol (VITAMIN D3) 1000 units CAPS Take by mouth.    . fluticasone (FLONASE) 50 MCG/ACT nasal spray Place 2 sprays into both nostrils daily. 16 g 11  . meloxicam (MOBIC) 15 MG tablet Take 1 tablet (15 mg total) by mouth daily as needed for pain. 90 tablet 1  . Multiple Vitamins-Minerals (ZINC PO) Take by mouth.    . Multiple Vitamins-Minerals-FA (VITATAB MV PO) Take by mouth.    . Olopatadine HCl (PATADAY) 0.2 % SOLN Apply 1 drop to eye daily. 2.5 mL 3  . saw palmetto 160 MG capsule Take 160 mg by mouth 2 (two) times daily.    Marland Kitchen tiZANidine (ZANAFLEX) 4 MG capsule Take 1 capsule (4 mg total) by mouth 3 (three) times daily as needed for muscle spasms. 90 capsule 5  . traZODone (DESYREL) 100 MG tablet Take 1 tablet (100 mg total) by mouth at bedtime. 90 tablet 1  . triamcinolone ointment (KENALOG) 0.5 % Apply 1 application topically 2 (two) times daily. 30 g 0  . Turmeric (RA TURMERIC) 500 MG CAPS Take 1 tablet by mouth. Reported on  08/19/2015    . enalapril (VASOTEC) 20 MG tablet Take 1 tablet (20 mg total) by mouth daily. 90 tablet 1  . gabapentin (NEURONTIN) 300 MG capsule  Take 3 capsules (900 mg total) by mouth 3 (three) times daily. 270 capsule 5  . zolpidem (AMBIEN) 10 MG tablet take 1 tablet by mouth at bedtime if needed for sleep 30 tablet 1   No facility-administered medications prior to visit.    No Known Allergies  ROS Review of Systems  Constitutional: Negative.   HENT: Negative.   Eyes: Negative.   Respiratory: Negative.   Cardiovascular: Negative.   Endocrine: Negative.   Genitourinary: Negative.   Musculoskeletal: Negative.   Skin: Negative.   Allergic/Immunologic: Negative.   Neurological: Negative.   Hematological: Negative.   Psychiatric/Behavioral: Positive for decreased concentration, dysphoric mood and sleep disturbance. Negative for agitation, behavioral problems, confusion, hallucinations, self-injury and suicidal ideas. The patient is nervous/anxious. The patient is not hyperactive.   All other systems reviewed and are negative.     Objective:    Physical Exam  Constitutional: He is oriented to person, place, and time. He appears well-developed and well-nourished. No distress.  Cardiovascular: Normal rate, regular rhythm and normal heart sounds. Exam reveals no gallop and no friction rub.  No murmur heard. Pulmonary/Chest: Effort normal and breath sounds normal. No respiratory distress. He has no wheezes. He has no rales. He exhibits no tenderness.  Neurological: He is alert and oriented to person, place, and time.  Skin: Skin is warm and dry. No rash noted. He is not diaphoretic. No erythema. No pallor.  Psychiatric: He has a normal mood and affect. His behavior is normal. Judgment and thought content normal.  Nursing note and vitals reviewed.   BP (!) 156/84   Pulse 94   Temp 98 F (36.7 C) (Temporal)   Ht 5\' 10"  (1.778 m)   Wt 220 lb 6.4 oz (100 kg)   SpO2 98%   BMI  31.62 kg/m  Wt Readings from Last 3 Encounters:  10/27/19 220 lb 6.4 oz (100 kg)  09/15/18 222 lb (100.7 kg)  06/14/18 216 lb 14.4 oz (98.4 kg)     There are no preventive care reminders to display for this patient.  There are no preventive care reminders to display for this patient.  Lab Results  Component Value Date   TSH 1.170 09/08/2016   Lab Results  Component Value Date   WBC 6.9 12/29/2017   HGB 15.6 12/29/2017   HCT 45.8 12/29/2017   MCV 94 12/29/2017   PLT 263 12/29/2017   Lab Results  Component Value Date   NA 139 09/15/2018   K 4.4 09/15/2018   CO2 21 09/15/2018   GLUCOSE 82 09/15/2018   BUN 17 09/15/2018   CREATININE 1.07 09/15/2018   BILITOT 0.3 09/15/2018   ALKPHOS 91 09/15/2018   AST 23 09/15/2018   ALT 19 09/15/2018   PROT 7.3 09/15/2018   ALBUMIN 4.8 09/15/2018   CALCIUM 9.5 09/15/2018   ANIONGAP 11 03/30/2014   Lab Results  Component Value Date   CHOL 159 09/15/2018   Lab Results  Component Value Date   HDL 49 09/15/2018   Lab Results  Component Value Date   LDLCALC 89 09/15/2018   Lab Results  Component Value Date   TRIG 103 09/15/2018   Lab Results  Component Value Date   CHOLHDL 3.2 09/15/2018   Lab Results  Component Value Date   HGBA1C 5.3 12/28/2014      Assessment & Plan:   Problem List Items Addressed This Visit      Cardiovascular and Mediastinum   Essential (primary) hypertension - Primary  Relevant Medications   enalapril (VASOTEC) 20 MG tablet     Musculoskeletal and Integument   Degenerative disc disease, lumbar   Relevant Medications   gabapentin (NEURONTIN) 300 MG capsule     Other   Insomnia due to other mental disorder   Relevant Medications   zolpidem (AMBIEN) 10 MG tablet    Other Visit Diagnoses    GAD (generalized anxiety disorder)       Relevant Medications   escitalopram (LEXAPRO) 10 MG tablet   ALPRAZolam (NIRAVAM) 0.25 MG dissolvable tablet   Panic attack       Relevant Medications    escitalopram (LEXAPRO) 10 MG tablet   ALPRAZolam (NIRAVAM) 0.25 MG dissolvable tablet      Meds ordered this encounter  Medications  . escitalopram (LEXAPRO) 10 MG tablet    Sig: Take 1 tablet (10 mg total) by mouth daily.    Dispense:  90 tablet    Refill:  0    Order Specific Question:   Supervising Provider    Answer:   Delia Chimes A [id number]  . ALPRAZolam (NIRAVAM) 0.25 MG dissolvable tablet    Sig: Take 1 tablet (0.25 mg total) by mouth 2 (two) times daily as needed for anxiety.    Dispense:  30 tablet    Refill:  0    Order Specific Question:   Supervising Provider    Answer:   Delia Chimes A [id number]  . zolpidem (AMBIEN) 10 MG tablet    Sig: take 1 tablet by mouth at bedtime if needed for sleep    Dispense:  30 tablet    Refill:  1    Order Specific Question:   Supervising Provider    Answer:   Delia Chimes A [id number]  . gabapentin (NEURONTIN) 300 MG capsule    Sig: Take 3 capsules (900 mg total) by mouth 3 (three) times daily.    Dispense:  270 capsule    Refill:  5    Order Specific Question:   Supervising Provider    Answer:   Delia Chimes A [id number]  . enalapril (VASOTEC) 20 MG tablet    Sig: Take 1 tablet (20 mg total) by mouth 2 (two) times daily.    Dispense:  180 tablet    Refill:  1    Order Specific Question:   Supervising Provider    Answer:   Forrest Moron [id number]    Follow-up: Return in about 1 month (around 11/24/2019) for med check - escitalopram and BP check w provider .   PLAN  Restart enalapril for BP  Gabapentin refill given  Start escitalopram 10mg  PO qd with med check in 1 mo  ambien qhs prn for sleep disturbance  Alprazolam 0.25mg  sublingual dissolving tablet for panic attacks during the day  Patient encouraged to call clinic with any questions, comments, or concerns.   Maximiano Coss, NP

## 2019-11-22 ENCOUNTER — Encounter: Payer: Self-pay | Admitting: Registered Nurse

## 2019-11-27 ENCOUNTER — Ambulatory Visit (INDEPENDENT_AMBULATORY_CARE_PROVIDER_SITE_OTHER): Payer: Medicare HMO | Admitting: Family Medicine

## 2019-11-27 ENCOUNTER — Other Ambulatory Visit: Payer: Self-pay

## 2019-11-27 ENCOUNTER — Encounter: Payer: Self-pay | Admitting: Family Medicine

## 2019-11-27 VITALS — BP 136/82 | HR 92 | Temp 98.0°F | Ht 71.0 in | Wt 219.0 lb

## 2019-11-27 DIAGNOSIS — M5136 Other intervertebral disc degeneration, lumbar region: Secondary | ICD-10-CM

## 2019-11-27 DIAGNOSIS — I10 Essential (primary) hypertension: Secondary | ICD-10-CM

## 2019-11-27 DIAGNOSIS — E538 Deficiency of other specified B group vitamins: Secondary | ICD-10-CM | POA: Diagnosis not present

## 2019-11-27 DIAGNOSIS — M542 Cervicalgia: Secondary | ICD-10-CM

## 2019-11-27 DIAGNOSIS — M5030 Other cervical disc degeneration, unspecified cervical region: Secondary | ICD-10-CM | POA: Diagnosis not present

## 2019-11-27 DIAGNOSIS — M629 Disorder of muscle, unspecified: Secondary | ICD-10-CM | POA: Diagnosis not present

## 2019-11-27 DIAGNOSIS — F411 Generalized anxiety disorder: Secondary | ICD-10-CM

## 2019-11-27 DIAGNOSIS — R69 Illness, unspecified: Secondary | ICD-10-CM | POA: Diagnosis not present

## 2019-11-27 MED ORDER — OLOPATADINE HCL 0.2 % OP SOLN: 1.0000 [drp] | Freq: Every day | OPHTHALMIC | 3 refills | Status: DC

## 2019-11-27 MED ORDER — CITALOPRAM HYDROBROMIDE 20 MG PO TABS: 20.0000 mg | ORAL_TABLET | Freq: Every day | ORAL | 3 refills | Status: DC

## 2019-11-27 NOTE — Progress Notes (Signed)
Pt is following up on hypertension pt states he hasn't had any issues with this condition since last visit. Pt also want to follow up on his visit  with Orland Mustard on 10/27/2019. Pt has some consers about the medication he was put on and stats they do not work. Pt also is having issues with his back pain pt states his meloxicam isn't working. Pt is having lower back pain and numbness that shoots down both legs. Depression score of 11.

## 2019-11-27 NOTE — Patient Instructions (Signed)
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
## 2019-11-28 LAB — LIPID PANEL
Chol/HDL Ratio: 3 ratio (ref 0.0–5.0)
Cholesterol, Total: 142 mg/dL (ref 100–199)
HDL: 48 mg/dL (ref >39)
LDL Chol Calc (NIH): 79 mg/dL (ref 0–99)
Triglycerides: 76 mg/dL (ref 0–149)
VLDL Cholesterol Cal: 15 mg/dL (ref 5–40)

## 2019-11-28 LAB — COMPREHENSIVE METABOLIC PANEL
ALT: 20 IU/L (ref 0–44)
AST: 29 IU/L (ref 0–40)
Albumin/Globulin Ratio: 1.9 (ref 1.2–2.2)
Albumin: 4.6 g/dL (ref 3.8–4.9)
Alkaline Phosphatase: 85 IU/L (ref 39–117)
BUN/Creatinine Ratio: 15 (ref 10–24)
BUN: 15 mg/dL (ref 8–27)
Bilirubin Total: 0.2 mg/dL (ref 0.0–1.2)
CO2: 19 mmol/L — ABNORMAL LOW (ref 20–29)
Calcium: 9.6 mg/dL (ref 8.6–10.2)
Chloride: 103 mmol/L (ref 96–106)
Creatinine, Ser: 1.02 mg/dL (ref 0.76–1.27)
GFR calc Af Amer: 92 mL/min/{1.73_m2} (ref >59)
GFR calc non Af Amer: 80 mL/min/{1.73_m2} (ref >59)
Globulin, Total: 2.4 g/dL (ref 1.5–4.5)
Glucose: 88 mg/dL (ref 65–99)
Potassium: 4.2 mmol/L (ref 3.5–5.2)
Sodium: 138 mmol/L (ref 134–144)
Total Protein: 7 g/dL (ref 6.0–8.5)

## 2019-11-28 LAB — VITAMIN B12: Vitamin B-12: 616 pg/mL (ref 232–1245)

## 2019-12-12 ENCOUNTER — Telehealth: Payer: Self-pay | Admitting: Family Medicine

## 2019-12-12 NOTE — Telephone Encounter (Signed)
Called pt his  will go with Doctor

## 2020-02-08 DIAGNOSIS — H04123 Dry eye syndrome of bilateral lacrimal glands: Secondary | ICD-10-CM | POA: Diagnosis not present

## 2020-02-08 DIAGNOSIS — I10 Essential (primary) hypertension: Secondary | ICD-10-CM | POA: Diagnosis not present

## 2020-02-08 DIAGNOSIS — H524 Presbyopia: Secondary | ICD-10-CM | POA: Diagnosis not present

## 2020-02-08 DIAGNOSIS — H259 Unspecified age-related cataract: Secondary | ICD-10-CM | POA: Diagnosis not present

## 2020-02-23 ENCOUNTER — Telehealth: Payer: Self-pay | Admitting: Family Medicine

## 2020-02-23 NOTE — Telephone Encounter (Signed)
Error

## 2020-02-23 NOTE — Telephone Encounter (Signed)
error 

## 2020-02-28 ENCOUNTER — Other Ambulatory Visit: Payer: Self-pay

## 2020-02-28 ENCOUNTER — Ambulatory Visit (INDEPENDENT_AMBULATORY_CARE_PROVIDER_SITE_OTHER): Payer: Medicare HMO | Admitting: Registered Nurse

## 2020-02-28 ENCOUNTER — Encounter: Payer: Self-pay | Admitting: Registered Nurse

## 2020-02-28 VITALS — BP 155/95 | HR 93 | Temp 97.9°F | Ht 71.0 in | Wt 215.8 lb

## 2020-02-28 DIAGNOSIS — F99 Mental disorder, not otherwise specified: Secondary | ICD-10-CM | POA: Diagnosis not present

## 2020-02-28 DIAGNOSIS — R69 Illness, unspecified: Secondary | ICD-10-CM | POA: Diagnosis not present

## 2020-02-28 DIAGNOSIS — F5105 Insomnia due to other mental disorder: Secondary | ICD-10-CM | POA: Diagnosis not present

## 2020-02-28 DIAGNOSIS — M79602 Pain in left arm: Secondary | ICD-10-CM

## 2020-02-28 DIAGNOSIS — R200 Anesthesia of skin: Secondary | ICD-10-CM

## 2020-02-28 MED ORDER — ZOLPIDEM TARTRATE 10 MG PO TABS: 10.0000 mg | ORAL_TABLET | Freq: Every evening | ORAL | 0 refills | Status: DC | PRN

## 2020-02-28 NOTE — Patient Instructions (Signed)
° ° ° °  If you have lab work done today you will be contacted with your lab results within the next 2 weeks.  If you have not heard from us then please contact us. The fastest way to get your results is to register for My Chart. ° ° °IF you received an x-ray today, you will receive an invoice from [facility]. Please contact [facility] at [phone] with questions or concerns regarding your invoice.  ° °IF you received labwork today, you will receive an invoice from [facility]. Please contact [facility] at [phone] with questions or concerns regarding your invoice.  ° °Our billing staff will not be able to assist you with questions regarding bills from these companies. ° °You will be contacted with the lab results as soon as they are available. The fastest way to get your results is to activate your My Chart account. Instructions are located on the last page of this paperwork. If you have not heard from us regarding the results in 2 weeks, please contact this office. °  ° ° ° °

## 2020-02-28 NOTE — Progress Notes (Signed)
Established Patient Office Visit  Subjective:  Patient ID: Justin Dunn., male    DOB: 1961-07-06  Age: 59 y.o. MRN: [id number]  CC:  Chief Complaint  Patient presents with   Numbness    Pt stated that after he got the [place] he has been experiencing numbness in his Lt shoulder and hand. and lower back pain    HPI Trever Streater. presents for numbness  Left arm and R leg. Mild weakness. Has been ongoing for around a month or so - since second covid vaccination. No other neuro symptoms No nvd, chest pain, shob, doe, or other cv concerns bp mildly elevated today, but has been particularly stressed lately.   Past Medical History:  Diagnosis Date   Anxiety    At risk for sleep apnea    STOP-BANG= 4   SENT TO PCP 04-02-2014   Chronic low back pain    Depression    ED (erectile dysfunction)    History of colon polyps    History of gastric ulcer    as child   Hypertension    Hypogonadism male    Incomplete bladder emptying    Lumbar spine scoliosis    Premature ejaculation    Prostate cancer ([place])    Dr Tammi Klippel ( oncologist) and Dr Loel Lofty [facility])    Retention of urine, unspecified    Sleep apnea    states never told to wear CPAP   Wears glasses     Past Surgical History:  Procedure Laterality Date   APPENDECTOMY  age 85   COLONOSCOPY W/ POLYPECTOMY  july 2015   LUMBAR FUSION  04/ 2012   and rod   PROSTATE BIOPSY     RADIOACTIVE SEED IMPLANT N/A 04/06/2014   Procedure: RADIOACTIVE SEED IMPLANT;  Surgeon: [name], MD;  Location: [place];  Service: Urology;  Laterality: N/A;  dr portable     Family History  Problem Relation Age of Onset   Prostate cancer Father    Hypertension Mother    Pulmonary fibrosis Mother    Arthritis Mother    Diabetes Mother    Deep vein thrombosis Sister     Social History   Socioeconomic History   Marital status: Divorced    Spouse name: Not on file    Number of children: Not on file   Years of education: Not on file   Highest education level: Some college, no degree  Occupational History   Occupation: disability    Comment: DDD lumbar spine; 2013  Tobacco Use   Smoking status: Current Some Day Smoker    Packs/day: 1.00    Years: 10.00    Pack years: 10.00    Types: Cigarettes, Cigars   Smokeless tobacco: Never Used   Tobacco comment: currently smokes 2 small cigar per day/  quit cigarettes 2011  Vaping Use   Vaping Use: Never used  Substance and Sexual Activity   Alcohol use: Yes    Alcohol/week: 0.0 standard drinks    Comment: 1-2 glasses of wine daily   Drug use: Yes    Types: Marijuana   Sexual activity: Never    Birth control/protection: Abstinence  Other Topics Concern   Not on file  Social History Narrative   Marital status: divorced; dating in 2019      Children: one foster son (autistic_      Lives: with foster son      Employment: disability from DDD  lumbar      Tobacco: none      Alcohol: none      Drugs:      Exercise: minimal in 2019.      ADLs; drives; independent with ADLs.      Advanced Directives: none; FULL CODE.  [name] [employer] [phone].   Social Determinants of Health   Financial Resource Strain:    Difficulty of Paying Living Expenses:   Food Insecurity:    Worried About Charity fundraiser in the Last Year:    Arboriculturist in the Last Year:   Transportation Needs:    Film/video editor (Medical):    Lack of Transportation (Non-Medical):   Physical Activity:    Days of Exercise per Week:    Minutes of Exercise per Session:   Stress:    Feeling of Stress :   Social Connections:    Frequency of Communication with Friends and Family:    Frequency of Social Gatherings with Friends and Family:    Attends Religious Services:    Active Member of Clubs or Organizations:    Attends Music therapist:    Marital  Status:   Intimate Partner Violence:    Fear of Current or Ex-Partner:    Emotionally Abused:    Physically Abused:    Sexually Abused:     Outpatient Medications Prior to Visit  Medication Sig Dispense Refill   amLODipine (NORVASC) 10 MG tablet Take 1 tablet (10 mg total) by mouth daily. 30 tablet 0   anastrozole (ARIMIDEX) 1 MG tablet Take 1 mg by mouth daily.     b complex vitamins tablet Take 1 tablet by mouth daily.     Calcium-Cholecalciferol (VITA-CALCIUM PO) Take by mouth.     Cholecalciferol (VITAMIN D3) 1000 units CAPS Take by mouth.     citalopram (CELEXA) 20 MG tablet Take 1 tablet (20 mg total) by mouth daily. 30 tablet 3   enalapril (VASOTEC) 20 MG tablet Take 1 tablet (20 mg total) by mouth 2 (two) times daily. 180 tablet 1   gabapentin (NEURONTIN) 300 MG capsule Take 3 capsules (900 mg total) by mouth 3 (three) times daily. 270 capsule 5   Melatonin 10 MG TABS Take by mouth.     meloxicam (MOBIC) 15 MG tablet Take 1 tablet (15 mg total) by mouth daily as needed for pain. 90 tablet 1   Multiple Vitamins-Minerals (ZINC PO) Take by mouth.     Multiple Vitamins-Minerals-FA (VITATAB MV PO) Take by mouth.     Olopatadine HCl (PATADAY) 0.2 % SOLN Apply 1 drop to eye daily. 2.5 mL 3   peppermint oil liquid by Does not apply route as needed.     saw palmetto 160 MG capsule Take 160 mg by mouth 2 (two) times daily.     sildenafil (REVATIO) 20 MG tablet Take by mouth.     traZODone (DESYREL) 100 MG tablet Take 1 tablet (100 mg total) by mouth at bedtime. 90 tablet 1   Turmeric (RA TURMERIC) 500 MG CAPS Take 1 tablet by mouth. Reported on 08/19/2015     zolpidem (AMBIEN) 10 MG tablet Take by mouth.     No facility-administered medications prior to visit.    No Known Allergies  ROS Review of Systems  Constitutional: Negative.   Eyes: Negative.   Endocrine: Negative.   Allergic/Immunologic: Negative.   All other systems reviewed and are  negative.     Objective:  Physical Exam Constitutional:      Appearance: Normal appearance. He is normal weight.  Skin:    General: Skin is warm and dry.     Capillary Refill: Capillary refill takes less than 2 seconds.     Findings: No bruising or erythema.  Neurological:     General: No focal deficit present.     Mental Status: He is alert and oriented to person, place, and time. Mental status is at baseline.     Sensory: No sensory deficit.     Motor: No weakness.     Coordination: Coordination normal.  Psychiatric:        Mood and Affect: Mood normal.        Behavior: Behavior normal.        Thought Content: Thought content normal.        Judgment: Judgment normal.     BP (!) 155/95    Pulse 93    Temp 97.9 F (36.6 C) (Temporal)    Ht 5\' 11"  (1.803 m)    Wt 215 lb 12.8 oz (97.9 kg)    SpO2 96%    BMI 30.10 kg/m  Wt Readings from Last 3 Encounters:  02/28/20 215 lb 12.8 oz (97.9 kg)  11/27/19 219 lb (99.3 kg)  10/27/19 220 lb 6.4 oz (100 kg)     Health Maintenance Due  Topic Date Due   COVID-19 Vaccine (2 - Pfizer 2-dose series) 12/13/2019    There are no preventive care reminders to display for this patient.  Lab Results  Component Value Date   TSH 1.170 09/08/2016   Lab Results  Component Value Date   WBC 6.9 12/29/2017   HGB 15.6 12/29/2017   HCT 45.8 12/29/2017   MCV 94 12/29/2017   PLT 263 12/29/2017   Lab Results  Component Value Date   NA 138 11/27/2019   K 4.2 11/27/2019   CO2 19 (L) 11/27/2019   GLUCOSE 88 11/27/2019   BUN 15 11/27/2019   CREATININE 1.02 11/27/2019   BILITOT 0.2 11/27/2019   ALKPHOS 85 11/27/2019   AST 29 11/27/2019   ALT 20 11/27/2019   PROT 7.0 11/27/2019   ALBUMIN 4.6 11/27/2019   CALCIUM 9.6 11/27/2019   ANIONGAP 11 03/30/2014   Lab Results  Component Value Date   CHOL 142 11/27/2019   Lab Results  Component Value Date   HDL 48 11/27/2019   Lab Results  Component Value Date   LDLCALC 79 11/27/2019    Lab Results  Component Value Date   TRIG 76 11/27/2019   Lab Results  Component Value Date   CHOLHDL 3.0 11/27/2019   Lab Results  Component Value Date   HGBA1C 5.3 12/28/2014      Assessment & Plan:   Problem List Items Addressed This Visit      Other   Insomnia due to other mental disorder - Primary   Relevant Medications   zolpidem (AMBIEN) 10 MG tablet    Other Visit Diagnoses    Left arm pain       Relevant Orders   EKG 12-Lead (Completed)   Numbness       Relevant Orders   Vitamin D, 25-hydroxy   Vitamin B12   Magnesium      Meds ordered this encounter  Medications   zolpidem (AMBIEN) 10 MG tablet    Sig: Take 1 tablet (10 mg total) by mouth at bedtime as needed for sleep.    Dispense:  30 tablet  Refill:  0    Follow-up: No follow-ups on file.   PLAN  Discussed COVID-19 arm and that this can be a normal experience for a week or two after receiving vaccine.   Refill Lorrin Mais  Discuss return precautions  Patient encouraged to call clinic with any questions, comments, or concerns.  Maximiano Coss, NP

## 2020-02-29 LAB — VITAMIN B12: Vitamin B-12: 1100 pg/mL (ref 232–1245)

## 2020-02-29 LAB — MAGNESIUM: Magnesium: 2.5 mg/dL — ABNORMAL HIGH (ref 1.6–2.3)

## 2020-02-29 LAB — VITAMIN D 25 HYDROXY (VIT D DEFICIENCY, FRACTURES): Vit D, 25-Hydroxy: 33.4 ng/mL (ref 30.0–100.0)

## 2020-03-10 ENCOUNTER — Encounter: Payer: Self-pay | Admitting: Registered Nurse

## 2020-03-12 ENCOUNTER — Ambulatory Visit: Payer: Medicare HMO | Admitting: Physical Therapy

## 2020-03-14 DIAGNOSIS — M542 Cervicalgia: Secondary | ICD-10-CM | POA: Diagnosis not present

## 2020-03-14 DIAGNOSIS — M79602 Pain in left arm: Secondary | ICD-10-CM | POA: Diagnosis not present

## 2020-03-14 DIAGNOSIS — M4602 Spinal enthesopathy, cervical region: Secondary | ICD-10-CM | POA: Diagnosis not present

## 2020-03-14 DIAGNOSIS — M2578 Osteophyte, vertebrae: Secondary | ICD-10-CM | POA: Diagnosis not present

## 2020-03-19 DIAGNOSIS — R0789 Other chest pain: Secondary | ICD-10-CM | POA: Diagnosis not present

## 2020-03-19 DIAGNOSIS — R4020 Unspecified coma: Secondary | ICD-10-CM | POA: Diagnosis not present

## 2020-03-19 DIAGNOSIS — I499 Cardiac arrhythmia, unspecified: Secondary | ICD-10-CM | POA: Diagnosis not present

## 2020-03-19 DIAGNOSIS — R079 Chest pain, unspecified: Secondary | ICD-10-CM | POA: Diagnosis not present

## 2020-03-19 DIAGNOSIS — R55 Syncope and collapse: Secondary | ICD-10-CM | POA: Diagnosis not present

## 2020-03-21 ENCOUNTER — Encounter: Payer: Self-pay | Admitting: Rehabilitative and Restorative Service Providers"

## 2020-03-21 ENCOUNTER — Other Ambulatory Visit: Payer: Self-pay | Admitting: Family Medicine

## 2020-03-21 ENCOUNTER — Other Ambulatory Visit: Payer: Self-pay

## 2020-03-21 ENCOUNTER — Ambulatory Visit: Payer: Medicare HMO | Attending: Family Medicine | Admitting: Rehabilitative and Restorative Service Providers"

## 2020-03-21 ENCOUNTER — Encounter: Payer: Self-pay | Admitting: Family Medicine

## 2020-03-21 ENCOUNTER — Ambulatory Visit (INDEPENDENT_AMBULATORY_CARE_PROVIDER_SITE_OTHER): Payer: Medicare HMO | Admitting: Family Medicine

## 2020-03-21 VITALS — BP 177/79 | HR 92 | Temp 98.0°F | Ht 71.0 in | Wt 212.0 lb

## 2020-03-21 DIAGNOSIS — M62838 Other muscle spasm: Secondary | ICD-10-CM | POA: Insufficient documentation

## 2020-03-21 DIAGNOSIS — R0609 Other forms of dyspnea: Secondary | ICD-10-CM

## 2020-03-21 DIAGNOSIS — R0600 Dyspnea, unspecified: Secondary | ICD-10-CM

## 2020-03-21 DIAGNOSIS — N39498 Other specified urinary incontinence: Secondary | ICD-10-CM | POA: Diagnosis not present

## 2020-03-21 DIAGNOSIS — I10 Essential (primary) hypertension: Secondary | ICD-10-CM | POA: Diagnosis not present

## 2020-03-21 DIAGNOSIS — M545 Low back pain: Secondary | ICD-10-CM | POA: Diagnosis not present

## 2020-03-21 DIAGNOSIS — M6281 Muscle weakness (generalized): Secondary | ICD-10-CM | POA: Diagnosis not present

## 2020-03-21 DIAGNOSIS — R55 Syncope and collapse: Secondary | ICD-10-CM | POA: Diagnosis not present

## 2020-03-21 DIAGNOSIS — M542 Cervicalgia: Secondary | ICD-10-CM | POA: Diagnosis not present

## 2020-03-21 DIAGNOSIS — G8929 Other chronic pain: Secondary | ICD-10-CM | POA: Diagnosis not present

## 2020-03-21 MED ORDER — ENALAPRIL MALEATE 20 MG PO TABS: 20.0000 mg | ORAL_TABLET | Freq: Two times a day (BID) | ORAL | 1 refills | Status: DC

## 2020-03-21 MED ORDER — HYDROCHLOROTHIAZIDE 12.5 MG PO CAPS: 12.5000 mg | ORAL_CAPSULE | Freq: Every day | ORAL | 1 refills | Status: DC

## 2020-03-21 NOTE — Patient Instructions (Addendum)
I will refer you to cardiology and neurology.   Make sure to drink plenty of fluids as event 2 days ago may have been due to dehydration.    Blood pressure is still running too high, start hydrochlorothiazide once per day.  It is very important you stay hydrated while taking that medication so you do not have another episode like 2 days ago.    I do not recommend driving until evaluated by neurology in case your symptoms were from a seizure the other day but that is less likely.  I will also have you see cardiology to discuss the shortness of breath with mowing the lawn, but if any acute or worsening symptoms be seen in the emergency room.  If any concerns on your blood work I will let you know.  Recheck 2 weeks  Return to the clinic or go to the nearest emergency room if any of your symptoms worsen or new symptoms occur.   If you have lab work done today you will be contacted with your lab results within the next 2 weeks.  If you have not heard from [place] then please contact us. The fastest way to get your results is to register for My Chart.   IF you received an x-ray today, you will receive an invoice from [facility]. Please contact [facility] at [phone] with questions or concerns regarding your invoice.   IF you received labwork today, you will receive an invoice from [place]. Please contact [facility] at [phone] with questions or concerns regarding your invoice.   Our billing staff will not be able to assist you with questions regarding bills from these companies.  You will be contacted with the lab results as soon as they are available. The fastest way to get your results is to activate your My Chart account. Instructions are located on the last page of this paperwork. If you have not heard from [place] regarding the results in 2 weeks, please contact this office.

## 2020-03-21 NOTE — Therapy (Signed)
[place], [place], [address] Phone: [phone]   Fax:  [phone]  Physical Therapy Evaluation  Patient Details  Name: Justin Dunn. MRN: [id number] Date of Birth: [birth year] Referring Provider (PT): Delia Chimes, MD (PCP left; pt transferring to Grant Fontana, MD )   Encounter Date: 03/21/2020   PT End of Session - 03/21/20 0811    Visit Number 1    Number of Visits 12    Date for PT Re-Evaluation 05/02/20    Authorization Type [employer] MCR    Progress Note Due on Visit 10    PT Start Time 0810    PT Stop Time 0850    PT Time Calculation (min) 40 min    Activity Tolerance Patient tolerated treatment well;No increased pain    Behavior During Therapy WFL for tasks assessed/performed           Past Medical History:  Diagnosis Date   Anxiety    At risk for sleep apnea    STOP-BANG= 4   SENT TO PCP 04-02-2014   Chronic low back pain    Depression    ED (erectile dysfunction)    History of colon polyps    History of gastric ulcer    as child   Hypertension    Hypogonadism male    Incomplete bladder emptying    Lumbar spine scoliosis    Premature ejaculation    Prostate cancer ([place])    Dr Tammi Klippel ( oncologist) and Dr Loel Lofty [facility])    Retention of urine, unspecified    Sleep apnea    states never told to wear CPAP   Wears glasses     Past Surgical History:  Procedure Laterality Date   APPENDECTOMY  age 7   COLONOSCOPY W/ POLYPECTOMY  july 2015   LUMBAR FUSION  04/ 2012   and rod   PROSTATE BIOPSY     RADIOACTIVE SEED IMPLANT N/A 04/06/2014   Procedure: RADIOACTIVE SEED IMPLANT;  Surgeon: [name], MD;  Location: [facility];  Service: Urology;  Laterality: N/A;  dr portable     There were no vitals filed for this visit.    Subjective Assessment - 03/21/20 0811    Subjective Pt 10 min late. I was helping a friend and I had a  sharp pain in my low back R > L; when my back flares up my hands start to shake. When I was with my daughter my back started to spasm; I stood up and leaned against the railing and blacked out. When I go to sleep at night, my left arm goes numb consistently. Currently taking prednisone and helping with numbness in L hand.    Pertinent History 2012 lumbar fusion with hardware, prostate involvement; Lumbar Spine scoliosis, cervical arthritis due to referral    Limitations Sitting;House hold activities;Lifting;Standing;Walking    How long can you sit comfortably? 4 hours    How long can you stand comfortably? 6 hours    How long can you walk comfortably? 2 hours    Diagnostic tests Xray shows bone spurs in cervical spine    Patient Stated Goals to become physically fit so I can perform activities without stress without taking medication    Currently in Pain? Yes    Pain Score 5     Pain Location Back    Pain Orientation Right;Posterior    Pain Descriptors / Indicators Burning    Pain Type  Chronic pain    Pain Radiating Towards towards LEs    Pain Onset More than a month ago    Pain Frequency Constant    Aggravating Factors  rotational movement    Pain Relieving Factors medicine, stretching    Multiple Pain Sites No              [facility] PT Assessment - 03/21/20 0001      Assessment   Medical Diagnosis back pain, neck pain, hamstring tightness    Referring Provider (PT) Delia Chimes, MD   PCP left; pt transferring to Grant Fontana, MD    Onset Date/Surgical Date 03/19/20    Hand Dominance Left    Next MD Visit 06/04/2020    Prior Therapy 2020 for neck pain      Precautions   Precautions --   not to lift over 10 lbs, limit rotation   Precaution Comments pt has prior lumbar fusion      Restrictions   Weight Bearing Restrictions No      Balance Screen   Has the patient fallen in the past 6 months Yes    How many times? --   1 when he blacked out   Has the patient had a decrease in  activity level because of a fear of falling?  No    Is the patient reluctant to leave their home because of a fear of falling?  No      Home Ecologist residence      Prior Function   Level of Independence Independent      Cognition   Overall Cognitive Status Within Functional Limits for tasks assessed      Observation/Other Assessments   Focus on Therapeutic Outcomes (FOTO)  63% limitation      Coordination   Gross Motor Movements are Fluid and Coordinated Yes      Posture/Postural Control   Posture Comments stands with bil knee slight flexion and with slouched posture; pt is unable to perform full shoulder flexion without exhibiting forward head at end shoulder ROM      ROM / Strength   AROM / PROM / Strength AROM;Strength      AROM   AROM Assessment Site Hip;Knee;Cervical;Lumbar    Cervical Flexion WFL    Cervical Extension limited 75%    Cervical - Right Side Bend 20 degrees    Cervical - Left Side Bend 12 degrees    Cervical - Right Rotation limited 50%    Cervical - Left Rotation limited 50%    Lumbar Flexion to knees only    Lumbar Extension 75% limited    Lumbar - Right Side Bend full     Lumbar - Left Side Bend limited to 50%    Lumbar - Right Rotation 10 degrees present    Lumbar - Left Rotation limited to neutral      Strength   Overall Strength Comments bil LE strength 5/5      Palpation   Palpation comment right lumbar paraspinals and glute tighter; hip PROM WFL but with strain/stretch reported lumbar spine with hip flexion bil R > L       Special Tests   Other special tests Thomas test +, SLR + for Hamstring tightness; butterfly stretch + for lumbosacral pain      Ambulation/Gait   Gait Comments amb with slight knee flexion  Objective measurements completed on examination: See above findings.                  PT Short Term Goals - 03/21/20 1240      PT SHORT TERM GOAL #1     Title Pt will report overall improved LBP R > L to </= 4/10 with functional mobility    Baseline 6-7/10    Time 4    Period Weeks    Status New    Target Date 04/18/20      PT SHORT TERM GOAL #2   Title Pt will report overall neck pain to be </= 4/10 with functional mobility    Baseline 6/10    Time 4    Period Weeks    Status New    Target Date 04/18/20             PT Long Term Goals - 03/21/20 1229      PT LONG TERM GOAL #1   Title pt to verbalize and demo proper posture and lifting mechanics to reduce and prevent neck/ low back     Time 6    Period Weeks    Status New    Target Date 05/02/20      PT LONG TERM GOAL #2   Title Pt will be able to perform all functional mobility with 75% less difficulty    Baseline unmet    Period Weeks    Status New    Target Date 05/02/20      PT LONG TERM GOAL #3   Title Foto score will improve to </= 56% limitation    Baseline 63%    Time 6    Period Weeks    Status New    Target Date 05/02/20      PT LONG TERM GOAL #4   Title Pt will be I with advanced HEP for continued lumbar/cervical strengthening and bil LE flexibility in prep for discharge to HEP    Baseline unmet    Time 6    Period Weeks    Status New    Target Date 05/02/20      PT LONG TERM GOAL #5   Title Pt will be able to demonstrate 10 overhead movements without performing forward head posturing at end range of shoulder movements in prep for returning to overhead home activities    Baseline unable; at end range of motion shoulder flexion, pt performs forward head    Time 6    Period Weeks    Status New    Target Date 05/02/20      Additional Long Term Goals   Additional Long Term Goals --                  Plan - 03/21/20 1223    Clinical Impression Statement Pt presents to PT with complaints of LBP R > L, Hamstring/hip flexor tightness, and neck pain. Pt has history of lumbar fusion 2012 with hardware placement. Pt is an active individual  who does a lot of lifting with improper body mechanics. Pt would benefit from PT for Hamstring/hip flexor flexibility, lumbar flexibility, lumbar strengthening, and cervical flexibility and strengthening to assist with improved painfree mobility. Pt would also benefit from body mechanics training as he keeps his feet planted with lumbar rotation. Pain relieving modalities to be used as needed.    Personal Factors and Comorbidities Comorbidity 1;Comorbidity 2    Comorbidities lumbar fusion 2012, prostate involvement  Examination-Activity Limitations Squat;Lift;Stairs;Locomotion Level;Bend;Carry    Examination-Participation Restrictions Shop;Cleaning;Community Activity    Stability/Clinical Decision Making Stable/Uncomplicated    Clinical Decision Making Low    Rehab Potential Good    PT Frequency 2x / week    PT Duration 6 weeks    PT Treatment/Interventions ADLs/Self Care Home Management;Ultrasound;Moist Heat;Electrical Stimulation;Functional mobility training;Therapeutic exercise;Therapeutic activities;Patient/family education;Manual techniques;Passive range of motion    PT Next Visit Plan Issue HEP; pt was late and evaluation was comprehension; therefore, no HEP was issued; issue for hip flexor and hamstring flexibility followed by tilt exercises and chin tucks    Consulted and Agree with Plan of Care Patient           Patient will benefit from skilled therapeutic intervention in order to improve the following deficits and impairments:  Decreased endurance, Decreased mobility, Hypomobility, Increased muscle spasms, Improper body mechanics, Decreased range of motion, Decreased activity tolerance, Decreased strength, Impaired flexibility, Pain  Visit Diagnosis: Chronic bilateral low back pain, unspecified whether sciatica present  Other muscle spasm  Cervicalgia  Muscle weakness (generalized)     Problem List Patient Active Problem List   Diagnosis Date Noted   Vitamin B12  deficiency (non anemic) 05/04/2019   Urinary urgency 05/04/2019   Tobacco use disorder 05/04/2019   Snoring 05/04/2019   Reduced libido 05/04/2019   Personal history of prostate cancer 05/04/2019   Low testosterone in male 05/04/2019   Acquired bladder diverticulum 05/04/2019   Family history of malignant neoplasm of prostate 05/04/2019   Excessive daytime sleepiness 05/04/2019   Erectile dysfunction following interstitial seed therapy 05/04/2019   Urge incontinence of urine 05/04/2019   Degenerative disc disease, lumbar 06/01/2016   Degenerative disc disease, cervical 06/01/2016   Insomnia due to other mental disorder 06/01/2016   Adjustment disorder with mixed anxiety and depressed mood 06/01/2016   Essential (primary) hypertension 02/07/2015   Malignant neoplasm of prostate ([place]) 01/09/2014    Evelette Hollern , PT 03/21/2020, 12:46 PM  [facility] [address] [place], [place], [address] Phone: [phone]   Fax:  [phone]  Name: Justin Dunn. MRN: [id number] Date of Birth: [birth year]

## 2020-03-21 NOTE — Telephone Encounter (Signed)
Patient is requesting 90 day supply on medication Rx written/prescribed today by provider. Sent for review of request

## 2020-03-21 NOTE — Progress Notes (Signed)
Subjective:  Patient ID: Justin Dunn., male    DOB: August 12, 1961  Age: 59 y.o. MRN: [id number]  CC:  Chief Complaint  Patient presents with  . passed out    Pt states on tuesday he was with his daughter and he passed out. EMS was called they did an EKG and took vitals. PT states they wanted to tak him to the hospital because they were concerned about a possible sezier, but pt declikned. Pt brought the EKG strip with him today with the reading. Pt was having back pain and went to [facility] they did x-rays and put to pt on prednisone.    HPI [name]. presents for   Syncopal episode: Occurred 2 days ago. Was at [employer]. Prepping food, then went to see daughter. Sitting on balcony. dtr noticed his hands were shaking - had more pain in back (treated by ortho, in physical therapy), but was wearing brace. Took off back brace. Stood up, felt lightheaded, then fell. LOC for "a little bit - few seconds". Daughter noticed eyes rolling back and trying to mumble something. Did have urinary incontinence. No mouth wounds, or bleeding. No visualized shaking during episode.  No prior seizures.  No new focal weakness, no slurred speech.  Some forgetfulness at times past year. No recent changes.  EMS called - recommended ER eval for possible seizure. Declined ER eval.  Possibly dehydrated at the time - drank large water bottle - felt better.  No chest pain. On gabapentin 900mg  tid, mobic 15mg  qd for back pain Trazodone and ambien at bedtime for sleep. No recent sildenafil.  Prednisone only new medicine.     Seen by Urgent Care 1 week ago for numbness and tingling in left arm, bone spurs in neck, 1 week of prednisone - today is last day. No hx of DM.   Trip to [place] 04/10/20.    Hypertension: Enalapril 20mg  BID, amlodipine 10 mg daily.  No missed doses.  Home readings: none No personal hx of heart issues. No chest pain with exercise but notices some shortness of  breath with cutting the yard. For years, no recent changes.  Father with pacemaker.   BP Readings from Last 3 Encounters:  03/21/20 (!) 177/79  02/28/20 (!) 155/95  11/27/19 136/82   Lab Results  Component Value Date   CREATININE 1.02 11/27/2019    Evaluated June 30 by Kathrin Ruddy for numbness in the left arm and right leg, smile weakness after receiving his second Covid vaccine.  No other neurologic symptoms at that time.  No chest pain, shortness of breath dyspnea exertion at that time. .Vitamin D, B12 were normal at that time.  Magnesium borderline elevated 2.5.  History Patient Active Problem List   Diagnosis Date Noted  . Vitamin B12 deficiency (non anemic) 05/04/2019  . Urinary urgency 05/04/2019  . Tobacco use disorder 05/04/2019  . Snoring 05/04/2019  . Reduced libido 05/04/2019  . Personal history of prostate cancer 05/04/2019  . Low testosterone in male 05/04/2019  . Acquired bladder diverticulum 05/04/2019  . Family history of malignant neoplasm of prostate 05/04/2019  . Excessive daytime sleepiness 05/04/2019  . Erectile dysfunction following interstitial seed therapy 05/04/2019  . Urge incontinence of urine 05/04/2019  . Degenerative disc disease, lumbar 06/01/2016  . Degenerative disc disease, cervical 06/01/2016  . Insomnia due to other mental disorder 06/01/2016  . Adjustment disorder with mixed anxiety and depressed mood 06/01/2016  . Essential (primary) hypertension  02/07/2015  . Malignant neoplasm of prostate ([place]) 01/09/2014   Past Medical History:  Diagnosis Date  . Anxiety   . At risk for sleep apnea    STOP-BANG= 4   SENT TO PCP 04-02-2014  . Chronic low back pain   . Depression   . ED (erectile dysfunction)   . History of colon polyps   . History of gastric ulcer    as child  . Hypertension   . Hypogonadism male   . Incomplete bladder emptying   . Lumbar spine scoliosis   . Premature ejaculation   . Prostate cancer [facility])    Dr Tammi Klippel (  oncologist) and Dr Loel Lofty [facility])   . Retention of urine, unspecified   . Sleep apnea    states never told to wear CPAP  . Wears glasses    Past Surgical History:  Procedure Laterality Date  . APPENDECTOMY  age 13  . COLONOSCOPY W/ POLYPECTOMY  july 2015  . LUMBAR FUSION  04/ 2012   and rod  . PROSTATE BIOPSY    . RADIOACTIVE SEED IMPLANT N/A 04/06/2014   Procedure: RADIOACTIVE SEED IMPLANT;  Surgeon: [name], MD;  Location: [facility];  Service: Urology;  Laterality: N/A;  dr portable    No Known Allergies Prior to Admission medications   Medication Sig Start Date End Date Taking? Authorizing Provider  amLODipine (NORVASC) 10 MG tablet Take 1 tablet (10 mg total) by mouth daily. 04/07/19  Yes Delia Chimes A, MD  b complex vitamins tablet Take 1 tablet by mouth daily.   Yes [provider]  Melatonin 10 MG TABS Take by mouth.    Yes [provider]  meloxicam (MOBIC) 15 MG tablet Take 1 tablet (15 mg total) by mouth daily as needed for pain. 08/14/19  Yes Rutherford Guys, MD  Multiple Vitamins-Minerals (ZINC PO) Take by mouth.   Yes [provider]  Multiple Vitamins-Minerals-FA (VITATAB MV PO) Take by mouth.   Yes [provider]  Olopatadine HCl (PATADAY) 0.2 % SOLN Apply 1 drop to eye daily. 11/27/19  Yes Stallings, Zoe A, MD  predniSONE (STERAPRED UNI-PAK 21 TAB) 10 MG (21) TBPK tablet Take by mouth. 03/14/20 03/24/20 Yes [provider]  sildenafil (REVATIO) 20 MG tablet Take by mouth. 10/16/19  Yes [provider]  traZODone (DESYREL) 100 MG tablet Take 1 tablet (100 mg total) by mouth at bedtime. 08/14/19  Yes Rutherford Guys, MD  Turmeric (RA TURMERIC) 500 MG CAPS Take 1 tablet by mouth. Reported on 08/19/2015   Yes [provider]  zolpidem (AMBIEN) 10 MG tablet Take 1 tablet (10 mg total) by mouth at bedtime as needed for sleep. 02/28/20  Yes Maximiano Coss, NP  anastrozole (ARIMIDEX)  1 MG tablet Take 1 mg by mouth daily. Patient not taking: Reported on 03/21/2020    [provider]  Calcium-Cholecalciferol (VITA-CALCIUM PO) Take by mouth. Patient not taking: Reported on 03/21/2020    [provider]  Cholecalciferol (VITAMIN D3) 1000 units CAPS Take by mouth. Patient not taking: Reported on 03/21/2020    [provider]  citalopram (CELEXA) 20 MG tablet Take 1 tablet (20 mg total) by mouth daily. Patient not taking: Reported on 03/21/2020 11/27/19   Forrest Moron, MD  enalapril (VASOTEC) 20 MG tablet Take 1 tablet (20 mg total) by mouth 2 (two) times daily. Patient not taking: Reported on 03/21/2020 10/27/19   Maximiano Coss, NP  gabapentin (  NEURONTIN) 300 MG capsule Take 3 capsules (900 mg total) by mouth 3 (three) times daily. Patient not taking: Reported on 03/21/2020 10/27/19   Maximiano Coss, NP  peppermint oil liquid by Does not apply route as needed. Patient not taking: Reported on 03/21/2020    [provider]  saw palmetto 160 MG capsule Take 160 mg by mouth 2 (two) times daily. Patient not taking: Reported on 03/21/2020    [provider]   Social History   Socioeconomic History  . Marital status: Divorced    Spouse name: Not on file  . Number of children: Not on file  . Years of education: Not on file  . Highest education level: Some college, no degree  Occupational History  . Occupation: disability    Comment: DDD lumbar spine; 2013  Tobacco Use  . Smoking status: Current Some Day Smoker    Packs/day: 1.00    Years: 10.00    Pack years: 10.00    Types: Cigarettes, Cigars  . Smokeless tobacco: Never Used  . Tobacco comment: currently smokes 2 small cigar per day/  quit cigarettes 2011  Vaping Use  . Vaping Use: Never used  Substance and Sexual Activity  . Alcohol use: Yes    Alcohol/week: 0.0 standard drinks    Comment: 1-2 glasses of wine daily  . Drug use: Yes    Types: Marijuana  . Sexual activity:  Never    Birth control/protection: Abstinence  Other Topics Concern  . Not on file  Social History Narrative   Marital status: divorced; dating in 2019      Children: one foster son (autistic_      Lives: with foster son      Employment: disability from DDD lumbar      Tobacco: none      Alcohol: none      Drugs:      Exercise: minimal in 2019.      ADLs; drives; independent with ADLs.      Advanced Directives: none; FULL CODE.  Johnsie Cancel Speth/[phone] [employer] [phone].   Social Determinants of Health   Financial Resource Strain:   . Difficulty of Paying Living Expenses:   Food Insecurity:   . Worried About Charity fundraiser in the Last Year:   . Arboriculturist in the Last Year:   Transportation Needs:   . Film/video editor (Medical):   Marland Kitchen Lack of Transportation (Non-Medical):   Physical Activity:   . Days of Exercise per Week:   . Minutes of Exercise per Session:   Stress:   . Feeling of Stress :   Social Connections:   . Frequency of Communication with Friends and Family:   . Frequency of Social Gatherings with Friends and Family:   . Attends Religious Services:   . Active Member of Clubs or Organizations:   . Attends Archivist Meetings:   Marland Kitchen Marital Status:   Intimate Partner Violence:   . Fear of Current or Ex-Partner:   . Emotionally Abused:   Marland Kitchen Physically Abused:   . Sexually Abused:     Review of Systems Per HPI.   Objective:   Vitals:   03/21/20 1142  BP: (!) 177/79  Pulse: 92  Temp: 98 F (36.7 C)  TempSrc: Temporal  SpO2: 99%  Weight: (!) 212 lb (96.2 kg)  Height: 5\' 11"  (1.803 m)   Orthostatic VS for the past 24 hrs (Last 3 readings):  BP- Lying Pulse- Lying BP-  Sitting Pulse- Sitting BP- Standing at 0 minutes Pulse- Standing at 0 minutes BP- Standing at 3 minutes Pulse- Standing at 3 minutes  03/21/20 1240 160/80 85 156/82 85 158/86 90 184/88 95     Physical Exam Vitals reviewed.  Constitutional:        Appearance: He is well-developed.  HENT:     Head: Normocephalic and atraumatic.  Eyes:     General: No visual field deficit.    Pupils: Pupils are equal, round, and reactive to light.  Neck:     Vascular: No carotid bruit or JVD.  Cardiovascular:     Rate and Rhythm: Normal rate and regular rhythm.     Heart sounds: Normal heart sounds. No murmur heard.   Pulmonary:     Effort: Pulmonary effort is normal.     Breath sounds: Normal breath sounds. No rales.  Skin:    General: Skin is warm and dry.  Neurological:     General: No focal deficit present.     Mental Status: He is alert and oriented to person, place, and time.     GCS: GCS eye subscore is 4. GCS verbal subscore is 5. GCS motor subscore is 6.     Cranial Nerves: No cranial nerve deficit, dysarthria or facial asymmetry.     Sensory: No sensory deficit.     Motor: Motor function is intact. No weakness, tremor, atrophy, abnormal muscle tone, seizure activity or pronator drift.     Coordination: Coordination is intact. Romberg sign negative. Coordination normal. Finger-Nose-Finger Test and Heel to [facility] Test normal. Rapid alternating movements normal.     Gait: Gait is intact.     EKG: Sinus rhythm, rate 83, no apparent ST or T wave changes, QTC 449.  Compared to 02/28/2020, no appreciable changes.  Also reviewed EMS EKG on 03/19/2020: sinus tachycardia with PVCs, rate 110.  Single PVC noted.  No appreciable change in ST/T waves from EMS EKG to today.  EMS notes on EKG, blood pressure 168/108 heart rate 110, CBG 115, O2 sat 96% room air.  Assessment & Plan:  Tatsuya Okray. is a 59 y.o. male . Syncope and collapse - Plan: EKG 12-Lead, Ambulatory referral to Neurology Other urinary incontinence - Plan: Ambulatory referral to Neurology  -On further history potentially had volume depletion with vagal episode, followed by single episode of urinary incontinence.  Hands were shaky prior to collapse, no visible seizure  activity otherwise.  No history of seizures.  EMS vitals reviewed as well as EKG, repeat EKG today without concerning findings.  Reassuring exam, nonfocal neurologic exam.  -Maintenance of hydration discussed.  Refer to neurology as well as cardiology.  Recommended against driving at this time in case possible seizure although less likely.  ER precautions given.  Essential (primary) hypertension - Plan: enalapril (VASOTEC) 20 MG tablet, Ambulatory referral to Cardiology, hydrochlorothiazide (MICROZIDE) 12.5 MG capsule  -Decreased control including orthostatics.  Maintenance of hydration discussed, but will add HCTZ 12.5 mg daily.  Continue enalapril and amlodipine same doses for now.  Labs pending.  DOE (dyspnea on exertion) - Plan: Ambulatory referral to Cardiology  -Longstanding symptoms with mowing yard., denies recent changes.  History of hypertension.  he would like to meet with cardiology, discuss stress testing, referral placed.  Can also discuss above symptoms but less likely cardiac.  Meds ordered this encounter  Medications  . enalapril (VASOTEC) 20 MG tablet    Sig: Take 1 tablet (20 mg total) by mouth 2 (  two) times daily.    Dispense:  180 tablet    Refill:  1  . hydrochlorothiazide (MICROZIDE) 12.5 MG capsule    Sig: Take 1 capsule (12.5 mg total) by mouth daily.    Dispense:  30 capsule    Refill:  1   Patient Instructions   I will refer you to cardiology and neurology.   Make sure to drink plenty of fluids as event 2 days ago may have been due to dehydration.    Blood pressure is still running too high, start hydrochlorothiazide once per day.  It is very important you stay hydrated while taking that medication so you do not have another episode like 2 days ago.    I do not recommend driving until evaluated by neurology in case your symptoms were from a seizure the other day but that is less likely.  I will also have you see cardiology to discuss the shortness of breath with  mowing the lawn, but if any acute or worsening symptoms be seen in the emergency room.  If any concerns on your blood work I will let you know.  Recheck 2 weeks  Return to the clinic or go to the nearest emergency room if any of your symptoms worsen or new symptoms occur.   If you have lab work done today you will be contacted with your lab results within the next 2 weeks.  If you have not heard from [place] then please contact us. The fastest way to get your results is to register for My Chart.   IF you received an x-ray today, you will receive an invoice from [facility]. Please contact [facility] at [phone] with questions or concerns regarding your invoice.   IF you received labwork today, you will receive an invoice from [facility]. Please contact [facility] at [phone] with questions or concerns regarding your invoice.   Our billing staff will not be able to assist you with questions regarding bills from these companies.  You will be contacted with the lab results as soon as they are available. The fastest way to get your results is to activate your My Chart account. Instructions are located on the last page of this paperwork. If you have not heard from [place] regarding the results in 2 weeks, please contact this office.         Signed, Merri Ray, MD [facility]

## 2020-03-22 LAB — CBC
Hematocrit: 44.6 % (ref 37.5–51.0)
Hemoglobin: 14.7 g/dL (ref 13.0–17.7)
MCH: 32 pg (ref 26.6–33.0)
MCHC: 33 g/dL (ref 31.5–35.7)
MCV: 97 fL (ref 79–97)
Platelets: 254 10*3/uL (ref 150–450)
RBC: 4.59 x10E6/uL (ref 4.14–5.80)
RDW: 13.7 % (ref 11.6–15.4)
WBC: 9.8 10*3/uL (ref 3.4–10.8)

## 2020-03-22 LAB — BASIC METABOLIC PANEL
BUN/Creatinine Ratio: 21 (ref 10–24)
BUN: 24 mg/dL (ref 8–27)
CO2: 26 mmol/L (ref 20–29)
Calcium: 9.5 mg/dL (ref 8.6–10.2)
Chloride: 102 mmol/L (ref 96–106)
Creatinine, Ser: 1.13 mg/dL (ref 0.76–1.27)
GFR calc Af Amer: 81 mL/min/{1.73_m2} (ref >59)
GFR calc non Af Amer: 70 mL/min/{1.73_m2} (ref >59)
Glucose: 144 mg/dL — ABNORMAL HIGH (ref 65–99)
Potassium: 4 mmol/L (ref 3.5–5.2)
Sodium: 142 mmol/L (ref 134–144)

## 2020-04-01 ENCOUNTER — Ambulatory Visit: Payer: Medicare HMO | Attending: Family Medicine | Admitting: Physical Therapy

## 2020-04-01 ENCOUNTER — Other Ambulatory Visit: Payer: Self-pay

## 2020-04-01 DIAGNOSIS — M6281 Muscle weakness (generalized): Secondary | ICD-10-CM | POA: Diagnosis not present

## 2020-04-01 DIAGNOSIS — G8929 Other chronic pain: Secondary | ICD-10-CM

## 2020-04-01 DIAGNOSIS — M62838 Other muscle spasm: Secondary | ICD-10-CM

## 2020-04-01 DIAGNOSIS — M545 Low back pain, unspecified: Secondary | ICD-10-CM

## 2020-04-01 DIAGNOSIS — M542 Cervicalgia: Secondary | ICD-10-CM

## 2020-04-01 NOTE — Therapy (Signed)
[place], [place], [address] Phone: [phone]   Fax:  [phone]  Physical Therapy Treatment  Patient Details  Name: Justin Dunn. MRN: [id number] Date of Birth: 1960/11/16 Referring Provider (PT): Delia Chimes, MD (PCP left; pt transferring to Grant Fontana, MD )   Encounter Date: 04/01/2020   PT End of Session - 04/01/20 1440    Visit Number 2    Number of Visits 12    Date for PT Re-Evaluation 05/02/20    Authorization Type [employer] MCR    Progress Note Due on Visit 10    PT Start Time 4315    PT Stop Time 4008    PT Time Calculation (min) 45 min    Activity Tolerance Patient tolerated treatment well;No increased pain    Behavior During Therapy WFL for tasks assessed/performed           Past Medical History:  Diagnosis Date  . Anxiety   . At risk for sleep apnea    STOP-BANG= 4   SENT TO PCP 04-02-2014  . Chronic low back pain   . Depression   . ED (erectile dysfunction)   . History of colon polyps   . History of gastric ulcer    as child  . Hypertension   . Hypogonadism male   . Incomplete bladder emptying   . Lumbar spine scoliosis   . Premature ejaculation   . Prostate cancer [facility])    Dr Tammi Klippel ( oncologist) and Dr Loel Lofty [facility])   . Retention of urine, unspecified   . Sleep apnea    states never told to wear CPAP  . Wears glasses     Past Surgical History:  Procedure Laterality Date  . APPENDECTOMY  age 77  . COLONOSCOPY W/ POLYPECTOMY  july 2015  . LUMBAR FUSION  04/ 2012   and rod  . PROSTATE BIOPSY    . RADIOACTIVE SEED IMPLANT N/A 04/06/2014   Procedure: RADIOACTIVE SEED IMPLANT;  Surgeon: [name], MD;  Location: [facility];  Service: Urology;  Laterality: N/A;  dr portable     There were no vitals filed for this visit.   Subjective Assessment - 04/01/20 1447    Subjective Pt reports no change in his status from evaluation. He  reports continued R side back stiffness.    Pertinent History 2012 lumbar fusion with hardware, prostate involvement; Lumbar Spine scoliosis, cervical arthritis due to referral    Limitations Sitting;House hold activities;Lifting;Standing;Walking    How long can you sit comfortably? 4 hours    How long can you stand comfortably? 6 hours    How long can you walk comfortably? 2 hours    Diagnostic tests Xray shows bone spurs in cervical spine    Patient Stated Goals to become physically fit so I can perform activities without stress without taking medication    Currently in Pain? Yes    Pain Score 6     Pain Location Back    Pain Orientation Right;Lower    Pain Descriptors / Indicators Tightness;Sharp    Pain Type Chronic pain    Pain Onset More than a month ago                             [facility] Adult PT Treatment/Exercise - 04/01/20 0001      Lumbar Exercises: Stretches   Active Hamstring Stretch Right;Left;2 reps;20  seconds    Single Knee to Chest Stretch 2 reps;20 seconds    Lower Trunk Rotation 5 reps;10 seconds    Other Lumbar Stretch Exercise Pball trunk flexion 2 x 30 sec, side flexion 2 x 30 sec bilat      Lumbar Exercises: Aerobic   Nustep L3 x 5 min      Lumbar Exercises: Standing   Other Standing Lumbar Exercises Chin tuck against wall x 10      Lumbar Exercises: Supine   Bridge 10 reps    Other Supine Lumbar Exercises Marching x 10 bilat      Manual Therapy   Manual Therapy Soft tissue mobilization;Myofascial release    Soft tissue mobilization STM with trigger point release of QL, lower lats, lumbar paraspinals    Myofascial Release QL, lower lats, lumbar paraspinals                  PT Education - 04/01/20 1532    Education Details Discussed utilizing tennis ball for self trigger point release/massage for lumbar and cervicothoracic regions.    Person(s) Educated Patient    Methods Explanation;Demonstration;Tactile cues;Verbal  cues;Handout    Comprehension Verbalized understanding;Returned demonstration;Verbal cues required;Tactile cues required            PT Short Term Goals - 03/21/20 1240      PT SHORT TERM GOAL #1   Title Pt will report overall improved LBP R > L to </= 4/10 with functional mobility    Baseline 6-7/10    Time 4    Period Weeks    Status New    Target Date 04/18/20      PT SHORT TERM GOAL #2   Title Pt will report overall neck pain to be </= 4/10 with functional mobility    Baseline 6/10    Time 4    Period Weeks    Status New    Target Date 04/18/20             PT Long Term Goals - 03/21/20 1229      PT LONG TERM GOAL #1   Title pt to verbalize and demo proper posture and lifting mechanics to reduce and prevent neck/ low back     Time 6    Period Weeks    Status New    Target Date 05/02/20      PT LONG TERM GOAL #2   Title Pt will be able to perform all functional mobility with 75% less difficulty    Baseline unmet    Period Weeks    Status New    Target Date 05/02/20      PT LONG TERM GOAL #3   Title Foto score will improve to </= 56% limitation    Baseline 63%    Time 6    Period Weeks    Status New    Target Date 05/02/20      PT LONG TERM GOAL #4   Title Pt will be I with advanced HEP for continued lumbar/cervical strengthening and bil LE flexibility in prep for discharge to HEP    Baseline unmet    Time 6    Period Weeks    Status New    Target Date 05/02/20      PT LONG TERM GOAL #5   Title Pt will be able to demonstrate 10 overhead movements without performing forward head posturing at end range of shoulder movements in prep for returning to overhead home activities  Baseline unable; at end range of motion shoulder flexion, pt performs forward head    Time 6    Period Weeks    Status New    Target Date 05/02/20      Additional Long Term Goals   Additional Long Term Goals --                 Plan - 04/01/20 1529    Clinical  Impression Statement Treatment focused primarily on decreasing pt's lumbar tightness. Provided manual therapy, stretches, and initiated core stabilization exercises. Pt with multiple trigger points along QL, lumbar paraspinals and lower lats. Pt tolerated treatment well.    Personal Factors and Comorbidities Comorbidity 1;Comorbidity 2    Comorbidities lumbar fusion 2012, prostate involvement    Examination-Activity Limitations Squat;Lift;Stairs;Locomotion Level;Bend;Carry    Examination-Participation Restrictions Shop;Cleaning;Community Activity    Stability/Clinical Decision Making Stable/Uncomplicated    Rehab Potential Good    PT Frequency 2x / week    PT Duration 6 weeks    PT Treatment/Interventions ADLs/Self Care Home Management;Ultrasound;Moist Heat;Electrical Stimulation;Functional mobility training;Therapeutic exercise;Therapeutic activities;Patient/family education;Manual techniques;Passive range of motion    PT Next Visit Plan Assess HEP. Continue hamstring and hp flexor stretches as able. Manual therapy if indicated. Continue progressing core and posture stabilization. Consider working on pelvic ROM and bilat LE strengthening.    PT Home Exercise Plan Access Code: [email or web address]    Consulted and Agree with Plan of Care Patient           Patient will benefit from skilled therapeutic intervention in order to improve the following deficits and impairments:  Decreased endurance, Decreased mobility, Hypomobility, Increased muscle spasms, Improper body mechanics, Decreased range of motion, Decreased activity tolerance, Decreased strength, Impaired flexibility, Pain  Visit Diagnosis: Chronic bilateral low back pain, unspecified whether sciatica present  Other muscle spasm  Cervicalgia  Muscle weakness (generalized)     Problem List Patient Active Problem List   Diagnosis Date Noted  . Vitamin B12 deficiency (non anemic) 05/04/2019  . Urinary urgency 05/04/2019  . Tobacco use  disorder 05/04/2019  . Snoring 05/04/2019  . Reduced libido 05/04/2019  . Personal history of prostate cancer 05/04/2019  . Low testosterone in male 05/04/2019  . Acquired bladder diverticulum 05/04/2019  . Family history of malignant neoplasm of prostate 05/04/2019  . Excessive daytime sleepiness 05/04/2019  . Erectile dysfunction following interstitial seed therapy 05/04/2019  . Urge incontinence of urine 05/04/2019  . Degenerative disc disease, lumbar 06/01/2016  . Degenerative disc disease, cervical 06/01/2016  . Insomnia due to other mental disorder 06/01/2016  . Adjustment disorder with mixed anxiety and depressed mood 06/01/2016  . Essential (primary) hypertension 02/07/2015  . Malignant neoplasm of prostate [facility]) 01/09/2014     Hasset Chaviano April Gordy Levan PT, DPT 04/01/2020, 3:37 PM  [facility] [address] [place], [place], [address] Phone: [phone]   Fax:  [phone]  Name: Justin Dunn. MRN: [id number] Date of Birth: 1960-12-28

## 2020-04-04 ENCOUNTER — Encounter: Payer: Self-pay | Admitting: Family Medicine

## 2020-04-04 ENCOUNTER — Other Ambulatory Visit: Payer: Self-pay

## 2020-04-04 ENCOUNTER — Ambulatory Visit (INDEPENDENT_AMBULATORY_CARE_PROVIDER_SITE_OTHER): Payer: Medicare HMO | Admitting: Family Medicine

## 2020-04-04 ENCOUNTER — Ambulatory Visit: Payer: Medicare HMO | Admitting: Physical Therapy

## 2020-04-04 VITALS — BP 150/78 | HR 103 | Temp 98.3°F | Resp 16 | Ht 71.0 in | Wt 214.2 lb

## 2020-04-04 DIAGNOSIS — F411 Generalized anxiety disorder: Secondary | ICD-10-CM | POA: Diagnosis not present

## 2020-04-04 DIAGNOSIS — R55 Syncope and collapse: Secondary | ICD-10-CM | POA: Diagnosis not present

## 2020-04-04 DIAGNOSIS — M62838 Other muscle spasm: Secondary | ICD-10-CM | POA: Diagnosis not present

## 2020-04-04 DIAGNOSIS — M542 Cervicalgia: Secondary | ICD-10-CM

## 2020-04-04 DIAGNOSIS — F5105 Insomnia due to other mental disorder: Secondary | ICD-10-CM | POA: Diagnosis not present

## 2020-04-04 DIAGNOSIS — R739 Hyperglycemia, unspecified: Secondary | ICD-10-CM | POA: Diagnosis not present

## 2020-04-04 DIAGNOSIS — M545 Low back pain, unspecified: Secondary | ICD-10-CM

## 2020-04-04 DIAGNOSIS — G8929 Other chronic pain: Secondary | ICD-10-CM

## 2020-04-04 DIAGNOSIS — F99 Mental disorder, not otherwise specified: Secondary | ICD-10-CM

## 2020-04-04 DIAGNOSIS — I10 Essential (primary) hypertension: Secondary | ICD-10-CM | POA: Diagnosis not present

## 2020-04-04 DIAGNOSIS — M6281 Muscle weakness (generalized): Secondary | ICD-10-CM

## 2020-04-04 DIAGNOSIS — R69 Illness, unspecified: Secondary | ICD-10-CM | POA: Diagnosis not present

## 2020-04-04 MED ORDER — ZOLPIDEM TARTRATE 10 MG PO TABS: 10.0000 mg | ORAL_TABLET | Freq: Every evening | ORAL | 0 refills | Status: DC | PRN

## 2020-04-04 MED ORDER — CITALOPRAM HYDROBROMIDE 40 MG PO TABS: 40.0000 mg | ORAL_TABLET | Freq: Every day | ORAL | 1 refills | Status: DC

## 2020-04-04 MED ORDER — TRAZODONE HCL 100 MG PO TABS: 100.0000 mg | ORAL_TABLET | Freq: Every day | ORAL | 1 refills | Status: DC

## 2020-04-04 NOTE — Progress Notes (Signed)
Subjective:  Patient ID: Justin Boettcher., male    DOB: 04-12-1961  Age: 59 y.o. MRN: [id number]  CC:  Chief Complaint  Patient presents with   Hypertension    pt denies physical symptoms other than previous syncopal episode 2 weeks ago, pt has been doing well otherwise.    Back Pain    pt is requesting a message referral per PT thinking that this may help in releasing some of the muscles for better results    Depression    pt needs refills of Zolpidem, citalopram and ambien for depression related insomnia     HPI Justin Dunn. presents for   Scheduled follow-up of syncopal episode.   Syncope: Last seen July 22.  Refer to cardiology and neurology.  Maintenance of hydration discussed.  Avoidance of driving in case he had possible seizure although less likely. No further syncope. appt with cardiology September 3rd - waiting to hear from neuro. Feeling ok. Drinking plenty of fluids.  Results for orders placed or performed in visit on 03/21/20  CBC  Result Value Ref Range   WBC 9.8 3.4 - 10.8 x10E3/uL   RBC 4.59 4.14 - 5.80 x10E6/uL   Hemoglobin 14.7 13.0 - 17.7 g/dL   Hematocrit 44.6 37.5 - 51.0 %   MCV 97 79 - 97 fL   MCH 32.0 26.6 - 33.0 pg   MCHC 33.0 31 - 35 g/dL   RDW 13.7 11.6 - 15.4 %   Platelets 254 150 - 450 E17E0/CX  Basic metabolic panel  Result Value Ref Range   Glucose 144 (H) 65 - 99 mg/dL   BUN 24 8 - 27 mg/dL   Creatinine, Ser 1.13 0.76 - 1.27 mg/dL   GFR calc non Af Amer 70 >59 mL/min/1.73   GFR calc Af Amer 81 >59 mL/min/1.73   BUN/Creatinine Ratio 21 10 - 24   Sodium 142 134 - 144 mmol/L   Potassium 4.0 3.5 - 5.2 mmol/L   Chloride 102 96 - 106 mmol/L   CO2 26 20 - 29 mmol/L   Calcium 9.5 8.6 - 10.2 mg/dL   Hyperglycemia: Not sure if fasting prior to last visit. No hx of DM, prediabetes.  Lab Results  Component Value Date   HGBA1C 5.3 12/28/2014    Hypertension: Decreased control last visit, added HCTZ 12.5 mg daily, continue  enalapril 20 mg BID. Marland Kitchen  Cardiology referral placed. No missed doses.  Some increased urination.  Home readings: no home readings.  Usually cooks at home. Some added salt. No regular frozen food.  BP Readings from Last 3 Encounters:  04/04/20 (!) 150/78  03/21/20 (!) 177/79  02/28/20 (!) 155/95   Lab Results  Component Value Date   CREATININE 1.13 03/21/2020   Insomnia with generalized anxiety disorder Previous primary provider Dr. Nolon Rod.  Has been treated with trazodone 100 mg nightly, Ambien 10 mg nightly as needed.,  Celexa 20 mg started in March, then increased to 18m - this has been helpful.  History of snoring when discussed with Dr. SPamella Pertin December 2020, referred to sleep specialist.  Ambien refilled at last visit June 30 with RKathrin Ruddy  Controlled substance database reviewed.  Last fill date June 30 for Ambien, previously February 26, both were #30. Takes ambien  3 nights per week. Trazodone.  Has not met with sleep specialist. Prior referral to [facility]  Still snoring, some sleepiness during day. No sleep study prior.   GAD 7 : Generalized Anxiety Score  04/04/2020 02/28/2020 10/27/2019  Nervous, Anxious, on Edge 3 0 1  Control/stop worrying 2 0 1  Worry too much - different things 2 0 1  Trouble relaxing 1 0 1  Restless 3 0 1  Easily annoyed or irritable 1 0 2  Afraid - awful might happen 1 0 1  Total GAD 7 Score 13 0 8  Anxiety Difficulty - Not difficult at all Somewhat difficult   Depression screen [facility] 2/9 04/04/2020 03/21/2020 03/21/2020 02/28/2020 11/27/2019  Decreased Interest 0 0 0 0 1  Down, Depressed, Hopeless 1 0 - 0 1  PHQ - 2 Score 1 0 0 0 2  Altered sleeping - - - - 1  Tired, decreased energy - - - - 3  Change in appetite - - - - 3  Feeling bad or failure about yourself  - - - - 1  Trouble concentrating - - - - 1  Moving slowly or fidgety/restless - - - - 0  Suicidal thoughts - - - - 0  PHQ-9 Score - - - - 11  Difficult doing work/chores - - - - -  Some  recent data might be hidden   Chronic low back pain Currently in physical therapy with outpatient rehab, appointment today.  He would like to meet with massage therapist as per PT notes had some benefit from trigger point release and massage, massage may lessen needs for PT visits.    History Patient Active Problem List   Diagnosis Date Noted   Vitamin B12 deficiency (non anemic) 05/04/2019   Urinary urgency 05/04/2019   Tobacco use disorder 05/04/2019   Snoring 05/04/2019   Reduced libido 05/04/2019   Personal history of prostate cancer 05/04/2019   Low testosterone in male 05/04/2019   Acquired bladder diverticulum 05/04/2019   Family history of malignant neoplasm of prostate 05/04/2019   Excessive daytime sleepiness 05/04/2019   Erectile dysfunction following interstitial seed therapy 05/04/2019   Urge incontinence of urine 05/04/2019   Degenerative disc disease, lumbar 06/01/2016   Degenerative disc disease, cervical 06/01/2016   Insomnia due to other mental disorder 06/01/2016   Adjustment disorder with mixed anxiety and depressed mood 06/01/2016   Essential (primary) hypertension 02/07/2015   Malignant neoplasm of prostate ([place]) 01/09/2014   Past Medical History:  Diagnosis Date   Anxiety    At risk for sleep apnea    STOP-BANG= 4   SENT TO PCP 04-02-2014   Chronic low back pain    Depression    ED (erectile dysfunction)    History of colon polyps    History of gastric ulcer    as child   Hypertension    Hypogonadism male    Incomplete bladder emptying    Lumbar spine scoliosis    Premature ejaculation    Prostate cancer ([place])    Dr Tammi Klippel ( oncologist) and Dr Loel Lofty ( [facility])    Retention of urine, unspecified    Sleep apnea    states never told to wear CPAP   Wears glasses    Past Surgical History:  Procedure Laterality Date   APPENDECTOMY  age 34   COLONOSCOPY W/ POLYPECTOMY  july 2015   LUMBAR FUSION  04/  2012   and rod   PROSTATE BIOPSY     RADIOACTIVE SEED IMPLANT N/A 04/06/2014   Procedure: RADIOACTIVE SEED IMPLANT;  Surgeon: [name], MD;  Location: [facility];  Service: Urology;  Laterality: N/A;  dr portable  No Known Allergies Prior to Admission medications   Medication Sig Start Date End Date Taking? Authorizing Provider  amLODipine (NORVASC) 10 MG tablet Take 1 tablet (10 mg total) by mouth daily. 04/07/19  Yes Stallings, Zoe A, MD  anastrozole (ARIMIDEX) 1 MG tablet Take 1 mg by mouth daily.    Yes [provider]  b complex vitamins tablet Take 1 tablet by mouth daily.   Yes [provider]  BLACK CURRANT SEED OIL PO Take by mouth.   Yes [provider]  Calcium-Cholecalciferol (VITA-CALCIUM PO) Take by mouth.    Yes [provider]  Cholecalciferol (VITAMIN D3) 1000 units CAPS Take by mouth.    Yes [provider]  citalopram (CELEXA) 20 MG tablet Take 1 tablet (20 mg total) by mouth daily. 11/27/19  Yes Stallings, Zoe A, MD  enalapril (VASOTEC) 20 MG tablet Take 1 tablet (20 mg total) by mouth 2 (two) times daily. 03/21/20  Yes Wendie Agreste, MD  gabapentin (NEURONTIN) 300 MG capsule Take 3 capsules (900 mg total) by mouth 3 (three) times daily. 10/27/19  Yes Maximiano Coss, NP  hydrochlorothiazide (MICROZIDE) 12.5 MG capsule Take 1 capsule (12.5 mg total) by mouth daily. 03/21/20  Yes Wendie Agreste, MD  Melatonin 10 MG TABS Take by mouth.    Yes [provider]  meloxicam (MOBIC) 15 MG tablet Take 1 tablet (15 mg total) by mouth daily as needed for pain. 08/14/19  Yes Rutherford Guys, MD  Multiple Vitamins-Minerals (ZINC PO) Take by mouth.   Yes [provider]  Multiple Vitamins-Minerals-FA (VITATAB MV PO) Take by mouth.   Yes [provider]  Olopatadine HCl (PATADAY) 0.2 % SOLN Apply 1 drop to eye daily. 11/27/19  Yes Stallings, Zoe A, MD  peppermint oil liquid by Does not apply  route as needed.    Yes [provider]  sildenafil (REVATIO) 20 MG tablet Take by mouth. 10/16/19  Yes [provider]  traZODone (DESYREL) 100 MG tablet Take 1 tablet (100 mg total) by mouth at bedtime. 08/14/19  Yes Rutherford Guys, MD  Turmeric (RA TURMERIC) 500 MG CAPS Take 1 tablet by mouth. Reported on 08/19/2015   Yes [provider]  zolpidem (AMBIEN) 10 MG tablet Take 1 tablet (10 mg total) by mouth at bedtime as needed for sleep. 02/28/20  Yes Maximiano Coss, NP   Social History   Socioeconomic History   Marital status: Divorced    Spouse name: Not on file   Number of children: Not on file   Years of education: Not on file   Highest education level: Some college, no degree  Occupational History   Occupation: disability    Comment: DDD lumbar spine; 2013  Tobacco Use   Smoking status: Current Some Day Smoker    Packs/day: 1.00    Years: 10.00    Pack years: 10.00    Types: Cigarettes, Cigars   Smokeless tobacco: Never Used   Tobacco comment: currently smokes 2 small cigar per day/  quit cigarettes 2011  Vaping Use   Vaping Use: Never used  Substance and Sexual Activity   Alcohol use: Yes    Alcohol/week: 0.0 standard drinks    Comment: 1-2 glasses of wine daily   Drug use: Yes    Types: Marijuana   Sexual activity: Never    Birth control/protection: Abstinence  Other Topics Concern   Not on file  Social History Narrative   Marital status: divorced; dating in  2019      Children: one foster son (autistic_      Lives: with foster son      Employment: disability from DDD lumbar      Tobacco: none      Alcohol: none      Drugs:      Exercise: minimal in 2019.      ADLs; drives; independent with ADLs.      Advanced Directives: none; FULL CODE.  Johnsie Cancel [phone] [employer] [phone].   Social Determinants of Health   Financial Resource Strain:    Difficulty of Paying Living Expenses:   Food  Insecurity:    Worried About Charity fundraiser in the Last Year:    Arboriculturist in the Last Year:   Transportation Needs:    Film/video editor (Medical):    Lack of Transportation (Non-Medical):   Physical Activity:    Days of Exercise per Week:    Minutes of Exercise per Session:   Stress:    Feeling of Stress :   Social Connections:    Frequency of Communication with Friends and Family:    Frequency of Social Gatherings with Friends and Family:    Attends Religious Services:    Active Member of Clubs or Organizations:    Attends Archivist Meetings:    Marital Status:   Intimate Partner Violence:    Fear of Current or Ex-Partner:    Emotionally Abused:    Physically Abused:    Sexually Abused:     Review of Systems Per HPI  Objective:   Vitals:   04/04/20 1349 04/04/20 1358  BP: (!) 170/96 (!) 150/78  Pulse: (!) 103   Resp: 16   Temp: 98.3 F (36.8 C)   TempSrc: Temporal   SpO2: 98%   Weight: 214 lb 3.2 oz (97.2 kg)   Height: '5\' 11"'  (1.803 m)      Physical Exam Vitals reviewed.  Constitutional:      Appearance: He is well-developed.  HENT:     Head: Normocephalic and atraumatic.  Eyes:     Pupils: Pupils are equal, round, and reactive to light.  Neck:     Vascular: No carotid bruit or JVD.  Cardiovascular:     Rate and Rhythm: Normal rate and regular rhythm.     Heart sounds: Normal heart sounds. No murmur heard.   Pulmonary:     Effort: Pulmonary effort is normal.     Breath sounds: Normal breath sounds. No rales.  Skin:    General: Skin is warm and dry.  Neurological:     Mental Status: He is alert and oriented to person, place, and time.        Assessment & Plan:  Aayan Haskew. is a 59 y.o. male . Syncope and collapse  -No recurrence of symptoms, continued hydration discussed and follow-up with cardiology, neurology as planned.  ER precautions given.  Insomnia due to other mental disorder -  Plan: zolpidem (AMBIEN) 10 MG tablet, traZODone (DESYREL) 100 MG tablet  -Possible multifactorial insomnia with anxiety, but also with snoring, some daytime somnolence, we did discuss previous concerns of obstructive sleep apnea, potential other impact to his health of untreated sleep apnea if that were present.  Phone number provided for sleep specialist to schedule appointment.  Agreed to refill trazodone, Ambien same dose for now with close follow-up.  Essential (primary) hypertension  -Continue same regimen for now, close monitoring of home readings, decreased  sodium although minimal use at this time.  If persistent elevation would consider addition of CCB.  GAD (generalized anxiety disorder) - Plan: citalopram (CELEXA) 40 MG tablet  -Overall stable, continue Celexa same dose.  40 mg pill provided  Hyperglycemia - Plan: Hemoglobin A1c  -Check A1c, previous hyperglycemia without known history of diabetes/prediabetes.  Chronic low back pain, unspecified back pain laterality, unspecified whether sciatica present  -Prescription given for trial of massage to see if that will be covered as it may decrease symptoms as well as work in conjunction with his physical therapy.  Meds ordered this encounter  Medications   zolpidem (AMBIEN) 10 MG tablet    Sig: Take 1 tablet (10 mg total) by mouth at bedtime as needed for sleep.    Dispense:  30 tablet    Refill:  0   citalopram (CELEXA) 40 MG tablet    Sig: Take 1 tablet (40 mg total) by mouth daily.    Dispense:  90 tablet    Refill:  1   traZODone (DESYREL) 100 MG tablet    Sig: Take 1 tablet (100 mg total) by mouth at bedtime.    Dispense:  90 tablet    Refill:  1   Patient Instructions    No change in hydrochlorothiazide, or enalapril for now.   I will check sugar test.   It is very important to follow-up with a sleep specialist as I am concerned about possible sleep apnea.  That can also affect your blood pressure.  Here is the  number to [facility] sleep center. We sent a referral there previously, but if they need a new referral, let me know. Call as soon as possible.  I will refill sleep meds for now, but will need to discuss further in next 6 weeks.  [facility] [address], [place], [place] [address] Phone: [phone]  I do recommend asking your physical therapist if they have a specific massage therapist they recommend. Take prescription, but let me know if your insurance needs more info.   Return to the clinic or go to the nearest emergency room if any of your symptoms worsen or new symptoms occur.    If you have lab work done today you will be contacted with your lab results within the next 2 weeks.  If you have not heard from [place] then please contact us. The fastest way to get your results is to register for My Chart.   IF you received an x-ray today, you will receive an invoice from [facility]. Please contact [facility] at [phone] with questions or concerns regarding your invoice.   IF you received labwork today, you will receive an invoice from [place]. Please contact [facility] at [phone] with questions or concerns regarding your invoice.   Our billing staff will not be able to assist you with questions regarding bills from these companies.  You will be contacted with the lab results as soon as they are available. The fastest way to get your results is to activate your My Chart account. Instructions are located on the last page of this paperwork. If you have not heard from [place] regarding the results in 2 weeks, please contact this office.         Signed, Merri Ray, MD [facility]

## 2020-04-04 NOTE — Therapy (Addendum)
[facility] [place], [place], [address] Phone: [phone]   Fax:  [phone]  Physical Therapy Treatment and Discharge  Patient Details  Name: Justin Dunn. MRN: [id number] Date of Birth: [birth year] Referring Provider (PT): Delia Chimes, MD (PCP left; pt transferring to Grant Fontana, MD )   Encounter Date: 04/04/2020   PT End of Session - 04/04/20 1021    Visit Number 3    Number of Visits 12    Date for PT Re-Evaluation 05/02/20    Authorization Type [employer] MCR    Progress Note Due on Visit 10    PT Start Time 0915    PT Stop Time 1007    PT Time Calculation (min) 52 min    Activity Tolerance Patient tolerated treatment well;No increased pain    Behavior During Therapy WFL for tasks assessed/performed           Past Medical History:  Diagnosis Date  . Anxiety   . At risk for sleep apnea    STOP-BANG= 4   SENT TO PCP 04-02-2014  . Chronic low back pain   . Depression   . ED (erectile dysfunction)   . History of colon polyps   . History of gastric ulcer    as child  . Hypertension   . Hypogonadism male   . Incomplete bladder emptying   . Lumbar spine scoliosis   . Premature ejaculation   . Prostate cancer [facility])    Dr Tammi Klippel ( oncologist) and Dr Loel Lofty [facility])   . Retention of urine, unspecified   . Sleep apnea    states never told to wear CPAP  . Wears glasses     Past Surgical History:  Procedure Laterality Date  . APPENDECTOMY  age 62  . COLONOSCOPY W/ POLYPECTOMY  july 2015  . LUMBAR FUSION  04/ 2012   and rod  . PROSTATE BIOPSY    . RADIOACTIVE SEED IMPLANT N/A 04/06/2014   Procedure: RADIOACTIVE SEED IMPLANT;  Surgeon: [name], MD;  Location: [facility] [place];  Service: Urology;  Laterality: N/A;  dr portable     There were no vitals filed for this visit.   Subjective Assessment - 04/04/20 0952    Subjective Pt reports a decrease in pain this  session. He reports increased soreness after the session and performing his own self trigger point release with the ball in the evening; however, the next day he felt less stiffness.    Pertinent History 2012 lumbar fusion with hardware, prostate involvement; Lumbar Spine scoliosis, cervical arthritis due to referral    Limitations Sitting;House hold activities;Lifting;Standing;Walking    How long can you sit comfortably? 4 hours    How long can you stand comfortably? 6 hours    How long can you walk comfortably? 2 hours    Diagnostic tests Xray shows bone spurs in cervical spine    Patient Stated Goals to become physically fit so I can perform activities without stress without taking medication    Currently in Pain? Yes    Pain Score 4     Pain Location Back    Pain Descriptors / Indicators Tightness    Pain Type Chronic pain                             [facility] Adult PT Treatment/Exercise - 04/04/20 0001      Exercises  Exercises Shoulder      Lumbar Exercises: Stretches   Active Hamstring Stretch Right;Left;2 reps;20 seconds    Lower Trunk Rotation 5 reps;10 seconds      Lumbar Exercises: Standing   Other Standing Lumbar Exercises Palloff press 2x10 bilat with blue band    Other Standing Lumbar Exercises Diagonal chops 2x10 with blue band bilat      Lumbar Exercises: Supine   Bridge 20 reps    Other Supine Lumbar Exercises knee bend lift 2x5      Shoulder Exercises: Standing   External Rotation Strengthening;Both;20 reps;Theraband    Theraband Level (Shoulder External Rotation) Level 4 (Blue)    Row Strengthening;Both;Theraband    Theraband Level (Shoulder Row) Level 4 (Blue)      Manual Therapy   Soft tissue mobilization self trigger point release of QL                  PT Education - 04/04/20 1032    Education Details Pt brought up discussion about going to massage therapist since he felt he had a lot of benefit from the trigger point release  and massage. Discussed with pt that before recommending massage, PT generally needs to check/reassess if manual therapy was effective. Pt states he felt that it was and we discussed decreasing PT visits to be able to have adjunct massage therapy.    Person(s) Educated Patient    Methods Explanation    Comprehension Verbalized understanding;Returned demonstration;Verbal cues required;Tactile cues required            PT Short Term Goals - 03/21/20 1240      PT SHORT TERM GOAL #1   Title Pt will report overall improved LBP R > L to </= 4/10 with functional mobility    Baseline 6-7/10    Time 4    Period Weeks    Status New    Target Date 04/18/20      PT SHORT TERM GOAL #2   Title Pt will report overall neck pain to be </= 4/10 with functional mobility    Baseline 6/10    Time 4    Period Weeks    Status New    Target Date 04/18/20             PT Long Term Goals - 03/21/20 1229      PT LONG TERM GOAL #1   Title pt to verbalize and demo proper posture and lifting mechanics to reduce and prevent neck/ low back     Time 6    Period Weeks    Status New    Target Date 05/02/20      PT LONG TERM GOAL #2   Title Pt will be able to perform all functional mobility with 75% less difficulty    Baseline unmet    Period Weeks    Status New    Target Date 05/02/20      PT LONG TERM GOAL #3   Title Foto score will improve to </= 56% limitation    Baseline 63%    Time 6    Period Weeks    Status New    Target Date 05/02/20      PT LONG TERM GOAL #4   Title Pt will be I with advanced HEP for continued lumbar/cervical strengthening and bil LE flexibility in prep for discharge to HEP    Baseline unmet    Time 6    Period Weeks    Status New  Target Date 05/02/20      PT LONG TERM GOAL #5   Title Pt will be able to demonstrate 10 overhead movements without performing forward head posturing at end range of shoulder movements in prep for returning to overhead home activities     Baseline unable; at end range of motion shoulder flexion, pt performs forward head    Time 6    Period Weeks    Status New    Target Date 05/02/20      Additional Long Term Goals   Additional Long Term Goals --                 Plan - 04/04/20 1016    Clinical Impression Statement Pt with improved lumbar muscle tightness -- less trigger points noted. Treatment focused primarily on core and thoracic strengthening. Pt asking about massage therapy in conjunction to PT; discussed with pt that this may be very beneficial for him so that PT can focus solely on strengthening. Pt in agreement to this plan and requesting to reduce PT visits to 1x/wk.    Personal Factors and Comorbidities Comorbidity 1;Comorbidity 2    Comorbidities lumbar fusion 2012, prostate involvement    Examination-Activity Limitations Squat;Lift;Stairs;Locomotion Level;Bend;Carry    Examination-Participation Restrictions Shop;Cleaning;Community Activity    Stability/Clinical Decision Making Stable/Uncomplicated    Rehab Potential Good    PT Frequency 2x / week    PT Duration 6 weeks    PT Treatment/Interventions ADLs/Self Care Home Management;Ultrasound;Moist Heat;Electrical Stimulation;Functional mobility training;Therapeutic exercise;Therapeutic activities;Patient/family education;Manual techniques;Passive range of motion    PT Next Visit Plan Assess HEP. Continue hamstring and hp flexor stretches as able. Manual therapy if indicated. Continue progressing core and posture stabilization. Consider working on pelvic ROM and bilat LE strengthening.    PT Home Exercise Plan Access Code: [email or web address]    Consulted and Agree with Plan of Care Patient           PHYSICAL THERAPY DISCHARGE SUMMARY  Visits from Start of Care: 2  Current functional level related to goals / functional outcomes: See above   Remaining deficits: See above   Education / Equipment: See above  Plan: Patient agrees to discharge.  Patient  goals were not met. Patient is being discharged due to meeting the stated rehab goals.  ?????        Patient will benefit from skilled therapeutic intervention in order to improve the following deficits and impairments:  Decreased endurance, Decreased mobility, Hypomobility, Increased muscle spasms, Improper body mechanics, Decreased range of motion, Decreased activity tolerance, Decreased strength, Impaired flexibility, Pain  Visit Diagnosis: Chronic bilateral low back pain, unspecified whether sciatica present  Other muscle spasm  Cervicalgia  Muscle weakness (generalized)     Problem List Patient Active Problem List   Diagnosis Date Noted  . Vitamin B12 deficiency (non anemic) 05/04/2019  . Urinary urgency 05/04/2019  . Tobacco use disorder 05/04/2019  . Snoring 05/04/2019  . Reduced libido 05/04/2019  . Personal history of prostate cancer 05/04/2019  . Low testosterone in male 05/04/2019  . Acquired bladder diverticulum 05/04/2019  . Family history of malignant neoplasm of prostate 05/04/2019  . Excessive daytime sleepiness 05/04/2019  . Erectile dysfunction following interstitial seed therapy 05/04/2019  . Urge incontinence of urine 05/04/2019  . Degenerative disc disease, lumbar 06/01/2016  . Degenerative disc disease, cervical 06/01/2016  . Insomnia due to other mental disorder 06/01/2016  . Adjustment disorder with mixed anxiety and depressed mood 06/01/2016  . Essential (primary) hypertension 02/07/2015  .  Malignant neoplasm of prostate [facility]) 01/09/2014    Sanja Elizardo April Ma L Doretha Goding PT, DPT 04/04/2020, 1:12 PM   [facility] April Ma L Scottie Metayer, [place], DPT 06/06/2020, 3:43PM  [facility] [address] [place], [place], [address] Phone: [phone]   Fax:  [phone]  Name: Justin Dunn. MRN: [id number] Date of Birth: 1960-10-25

## 2020-04-04 NOTE — Patient Instructions (Addendum)
  No change in hydrochlorothiazide, or enalapril for now.   I will check sugar test.   It is very important to follow-up with a sleep specialist as I am concerned about possible sleep apnea.  That can also affect your blood pressure.  Here is the number to [facility] sleep center. We sent a referral there previously, but if they need a new referral, let me know. Call as soon as possible.  I will refill sleep meds for now, but will need to discuss further in next 6 weeks.  [place] sleep [facility] [address], [place], [place] [address] Phone: [phone]  I do recommend asking your physical therapist if they have a specific massage therapist they recommend. Take prescription, but let me know if your insurance needs more info.   Return to the clinic or go to the nearest emergency room if any of your symptoms worsen or new symptoms occur.    If you have lab work done today you will be contacted with your lab results within the next 2 weeks.  If you have not heard from [place] then please contact us. The fastest way to get your results is to register for My Chart.   IF you received an x-ray today, you will receive an invoice from [facility]. Please contact [facility] at [phone] with questions or concerns regarding your invoice.   IF you received labwork today, you will receive an invoice from [place]. Please contact [facility] at [phone] with questions or concerns regarding your invoice.   Our billing staff will not be able to assist you with questions regarding bills from these companies.  You will be contacted with the lab results as soon as they are available. The fastest way to get your results is to activate your My Chart account. Instructions are located on the last page of this paperwork. If you have not heard from [place] regarding the results in 2 weeks, please contact this office.

## 2020-04-05 DIAGNOSIS — Z03818 Encounter for observation for suspected exposure to other biological agents ruled out: Secondary | ICD-10-CM | POA: Diagnosis not present

## 2020-04-05 DIAGNOSIS — Z20822 Contact with and (suspected) exposure to covid-19: Secondary | ICD-10-CM | POA: Diagnosis not present

## 2020-04-05 LAB — HEMOGLOBIN A1C
Est. average glucose Bld gHb Est-mCnc: 120 mg/dL
Hgb A1c MFr Bld: 5.8 % — ABNORMAL HIGH (ref 4.8–5.6)

## 2020-04-07 DIAGNOSIS — Z20822 Contact with and (suspected) exposure to covid-19: Secondary | ICD-10-CM | POA: Diagnosis not present

## 2020-04-08 ENCOUNTER — Ambulatory Visit: Payer: Medicare HMO | Admitting: Rehabilitative and Restorative Service Providers"

## 2020-04-18 ENCOUNTER — Ambulatory Visit: Payer: Medicare HMO | Admitting: Physical Therapy

## 2020-04-22 ENCOUNTER — Ambulatory Visit: Payer: Medicare HMO | Admitting: Physical Therapy

## 2020-04-22 DIAGNOSIS — C61 Malignant neoplasm of prostate: Secondary | ICD-10-CM | POA: Diagnosis not present

## 2020-04-22 DIAGNOSIS — N5236 Erectile dysfunction following interstitial seed therapy: Secondary | ICD-10-CM | POA: Diagnosis not present

## 2020-04-22 DIAGNOSIS — N323 Diverticulum of bladder: Secondary | ICD-10-CM | POA: Diagnosis not present

## 2020-04-22 DIAGNOSIS — Z8042 Family history of malignant neoplasm of prostate: Secondary | ICD-10-CM | POA: Diagnosis not present

## 2020-04-24 ENCOUNTER — Encounter: Payer: Medicare HMO | Admitting: Physical Therapy

## 2020-05-03 ENCOUNTER — Ambulatory Visit: Payer: Medicare HMO | Admitting: Cardiology

## 2020-05-26 ENCOUNTER — Encounter: Payer: Self-pay | Admitting: Registered Nurse

## 2020-06-04 ENCOUNTER — Encounter: Payer: Medicare HMO | Admitting: Family Medicine

## 2020-06-06 ENCOUNTER — Encounter: Payer: Medicare HMO | Admitting: Family Medicine

## 2020-06-13 ENCOUNTER — Ambulatory Visit: Payer: Medicare HMO | Admitting: Cardiology

## 2020-06-13 ENCOUNTER — Encounter: Payer: Self-pay | Admitting: Cardiology

## 2020-06-13 ENCOUNTER — Other Ambulatory Visit: Payer: Self-pay

## 2020-06-13 VITALS — BP 164/100 | HR 75 | Ht 71.0 in | Wt 225.2 lb

## 2020-06-13 DIAGNOSIS — R69 Illness, unspecified: Secondary | ICD-10-CM | POA: Diagnosis not present

## 2020-06-13 DIAGNOSIS — Z716 Tobacco abuse counseling: Secondary | ICD-10-CM | POA: Diagnosis not present

## 2020-06-13 DIAGNOSIS — Z87898 Personal history of other specified conditions: Secondary | ICD-10-CM | POA: Insufficient documentation

## 2020-06-13 DIAGNOSIS — I10 Essential (primary) hypertension: Secondary | ICD-10-CM | POA: Diagnosis not present

## 2020-06-13 DIAGNOSIS — Z7189 Other specified counseling: Secondary | ICD-10-CM

## 2020-06-13 DIAGNOSIS — F17200 Nicotine dependence, unspecified, uncomplicated: Secondary | ICD-10-CM

## 2020-06-13 NOTE — Progress Notes (Signed)
Cardiology Office Note:    Date:  06/13/2020   ID:  Justin Boettcher., DOB December 01, 1960, MRN [id number]  PCP:  Forrest Moron, MD  Cardiologist:  Buford Dresser, MD  Referring MD: Wendie Agreste, MD   CC: new patient evaluation for history of syncope and hypertension  History of Present Illness:    Justin Barco. is a 59 y.o. male with a hx of hypertension, prostate cancer who is seen as a new consult at the request of Wendie Agreste, MD for the evaluation and management of history of syncope, hypertension.  Reviewed note from 04/04/20 with Dr. Carlota Raspberry. Seen for syncope follow up at that time. He had an appt 9/3 scheduled with me but had to reschedule this appt.  Reviewed note from Dr. Carlota Raspberry 03/21/20 that detailed initial syncopal event. ECG at that visit NSR.  Syncope: Initial episode: 03/19/20. Just got off work, went to see his daughter. Hadn't taken medication that AM. Hadn't eaten yet that day either. Stood up and started to feel lightheaded. He blacked out and collapsed. EMS came, noted concern for seizure. No confusion, but did lose control of bladder. Eyes rolled back per his daughter.  Frequency: only single episode Duration of episodes: very brief Presyncopal symptoms: lightheadedness Post syncope symptoms: loss of bladder control. Does note that he had prior incontinence after prostate cancer treatment, but this improved after working with Dr. Rosana Hoes at [facility]. Aggravating/alleviating factors: hadn't taken meds or eaten that day  Hypertension: blood pressures have been rising since about 10/2019. He notes this was around the time his autistic ward was removed from his house/guardianship. He is currently taking amlodipine 10 mg daily, enalapril 20 mg BID, HCTZ 12.5 mg daily.   He eats very well, avoids fast food. Cooks lots of fruits/vegetables, does his own cooking. Does use Kosher salt occasionally to season, tries to minimize this. Weight has remained stable  between 215-220 lbs on his home scale.   He has been having a lot of lower back pain. Was taking gabapentin 900 mg TID, but has to drop back to 900 mg BID so he can function. Has prior spinal surgery. Has had a dark area with decreased sensation on his right toe since then. Feels that his elevated blood pressure lines up with pain and stress. Didn't improve with physical therapy. Has been under significant personal stress as well. Has gone through divorce, lost guardianship of autistic "son" that he cared for for 16 years (higher level of care). Mother is 27, frail, lives in the [place].   Smokes small cigars, about 2 per day. Discussed cessation, he is trying.   Father died at age 17, had a pacemaker. Had to have a needle placed in his chest. Unclear what the circumstances were. Was a smoker, has some part of his heart that was weak.   He states that Dr. Carlota Raspberry wanted to have him get a sleep study, but hasn't been able to have this done yet due to financial constraints.   We discussed our social worker reaching out. He would appreciate this. He would also appreciate a home blood pressure cuff to monitor his home readings.   Past Medical History:  Diagnosis Date  . Anxiety   . At risk for sleep apnea    STOP-BANG= 4   SENT TO PCP 04-02-2014  . Chronic low back pain   . Depression   . ED (erectile dysfunction)   . History of colon polyps   .  History of gastric ulcer    as child  . Hypertension   . Hypogonadism male   . Incomplete bladder emptying   . Lumbar spine scoliosis   . Premature ejaculation   . Prostate cancer [facility])    Dr Tammi Klippel ( oncologist) and Dr Loel Lofty [facility])   . Retention of urine, unspecified   . Sleep apnea    states never told to wear CPAP  . Wears glasses     Past Surgical History:  Procedure Laterality Date  . APPENDECTOMY  age 48  . COLONOSCOPY W/ POLYPECTOMY  july 2015  . LUMBAR FUSION  04/ 2012   and rod  . PROSTATE BIOPSY    .  RADIOACTIVE SEED IMPLANT N/A 04/06/2014   Procedure: RADIOACTIVE SEED IMPLANT;  Surgeon: [name], MD;  Location: [facility];  Service: Urology;  Laterality: N/A;  dr portable     Current Medications: Current Outpatient Medications on File Prior to Visit  Medication Sig  . amLODipine (NORVASC) 10 MG tablet Take 1 tablet (10 mg total) by mouth daily.  Marland Kitchen anastrozole (ARIMIDEX) 1 MG tablet Take 1 mg by mouth daily.   Marland Kitchen b complex vitamins tablet Take 1 tablet by mouth daily.  Marland Kitchen BLACK CURRANT SEED OIL PO Take by mouth.  . Calcium-Cholecalciferol (VITA-CALCIUM PO) Take by mouth.   . Cholecalciferol (VITAMIN D3) 1000 units CAPS Take by mouth.   . citalopram (CELEXA) 40 MG tablet Take 1 tablet (40 mg total) by mouth daily.  . enalapril (VASOTEC) 20 MG tablet Take 1 tablet (20 mg total) by mouth 2 (two) times daily.  Marland Kitchen escitalopram (LEXAPRO) 10 MG tablet Take 10 mg by mouth daily.  Marland Kitchen gabapentin (NEURONTIN) 300 MG capsule Take 3 capsules (900 mg total) by mouth 3 (three) times daily.  . hydrochlorothiazide (MICROZIDE) 12.5 MG capsule Take 1 capsule (12.5 mg total) by mouth daily.  . meloxicam (MOBIC) 15 MG tablet Take 1 tablet (15 mg total) by mouth daily as needed for pain.  . Multiple Vitamins-Minerals (ZINC PO) Take by mouth.  . Multiple Vitamins-Minerals-FA (VITATAB MV PO) Take by mouth.  . Olopatadine HCl (PATADAY) 0.2 % SOLN Apply 1 drop to eye daily.  . peppermint oil liquid by Does not apply route as needed.   . traZODone (DESYREL) 100 MG tablet Take 1 tablet (100 mg total) by mouth at bedtime.  . Turmeric (RA TURMERIC) 500 MG CAPS Take 1 tablet by mouth. Reported on 08/19/2015  . zolpidem (AMBIEN) 10 MG tablet Take 1 tablet (10 mg total) by mouth at bedtime as needed for sleep.   No current facility-administered medications on file prior to visit.     Allergies:   Patient has no known allergies.   Social History   Tobacco Use  . Smoking status: Current Some Day  Smoker    Packs/day: 1.00    Years: 10.00    Pack years: 10.00    Types: Cigarettes, Cigars  . Smokeless tobacco: Never Used  . Tobacco comment: currently smokes 2 small cigar per day/  quit cigarettes 2011  Vaping Use  . Vaping Use: Never used  Substance Use Topics  . Alcohol use: Yes    Alcohol/week: 0.0 standard drinks    Comment: 1-2 glasses of wine daily  . Drug use: Yes    Types: Marijuana    Family History: family history includes Arthritis in his mother; Deep vein thrombosis in his sister; Diabetes in his mother; Hypertension in his  mother; Prostate cancer in his father; Pulmonary fibrosis in his mother.  ROS:   Please see the history of present illness.  Additional pertinent ROS: Constitutional: Negative for chills, fever, night sweats, unintentional weight loss  HENT: Negative for ear pain and hearing loss.   Eyes: Negative for loss of vision and eye pain.  Respiratory: Negative for cough, sputum, wheezing.   Cardiovascular: See HPI. Gastrointestinal: Negative for abdominal pain, melena, and hematochezia.  Genitourinary: Negative for dysuria and hematuria.  Musculoskeletal: Negative for falls and myalgias.  Skin: Negative for itching and rash.  Neurological: Negative for focal weakness, focal sensory changes and loss of consciousness.  Endo/Heme/Allergies: Does not bruise/bleed easily.     EKGs/Labs/Other Studies Reviewed:    The following studies were reviewed today: No prior cardiac studies  EKG:  EKG is personally reviewed.  The ekg ordered today demonstrates NSR at 75 bpm  Recent Labs: 11/27/2019: ALT 20 02/28/2020: Magnesium 2.5 03/21/2020: BUN 24; Creatinine, Ser 1.13; Hemoglobin 14.7; Platelets 254; Potassium 4.0; Sodium 142  Recent Lipid Panel    Component Value Date/Time   CHOL 142 11/27/2019 1232   TRIG 76 11/27/2019 1232   HDL 48 11/27/2019 1232   CHOLHDL 3.0 11/27/2019 1232   CHOLHDL 2.2 06/17/2015 1235   VLDL 12 06/17/2015 1235   LDLCALC 79  11/27/2019 1232    Physical Exam:    VS:  BP (!) 164/100   Pulse 75   Ht 5\' 11"  (1.803 m)   Wt 225 lb 3.2 oz (102.2 kg)   SpO2 99%   BMI 31.41 kg/m     Wt Readings from Last 3 Encounters:  06/13/20 225 lb 3.2 oz (102.2 kg)  04/04/20 214 lb 3.2 oz (97.2 kg)  03/21/20 (!) 212 lb (96.2 kg)    GEN: Well nourished, well developed in no acute distress HEENT: Normal, moist mucous membranes NECK: No JVD CARDIAC: regular rhythm, normal S1 and S2, no rubs or gallops. No murmurs. VASCULAR: Radial and DP pulses 2+ bilaterally. No carotid bruits RESPIRATORY:  Clear to auscultation without rales, wheezing or rhonchi  ABDOMEN: Soft, non-tender, non-distended MUSCULOSKELETAL:  Ambulates independently SKIN: Warm and dry, no edema NEUROLOGIC:  Alert and oriented x 3. No focal neuro deficits noted. PSYCHIATRIC:  Normal affect    ASSESSMENT:    1. Essential (primary) hypertension   2. Tobacco use disorder   3. History of syncope   4. Tobacco abuse counseling   5. Cardiac risk counseling   6. Counseling on health promotion and disease prevention    PLAN:    Syncope: -highly suspicious for vasovagal/reflex syncope -had prodrome -did lose control of bladder, but has history of urinary issues since prostate cancer  Hypertension: blood pressure is elevated today. -able to obtain a home cuff for him in the office, instructed on use -will take amlodipine 10 mg daily, enalapril 20 mg BID, and HCTZ 12.5 mg daily -we will follow up blood pressure closely  Tobacco cessation counseling: The patient was counseled on tobacco cessation today for 4 minutes.  Counseling included reviewing the risks of smoking tobacco products, how it impacts the patient's current medical diagnoses and different strategies for quitting.  Pharmacotherapy to aid in tobacco cessation was not prescribed today.  Cardiac risk counseling and prevention recommendations: -recommend heart healthy/Mediterranean diet, with whole  grains, fruits, vegetable, fish, lean meats, nuts, and olive oil. Limit salt. -recommend moderate walking, 3-5 times/week for 30-50 minutes each session. Aim for at least 150 minutes.week. Goal should be pace  of 3 miles/hours, or walking 1.5 miles in 30 minutes -recommend avoidance of tobacco products. Avoid excess alcohol. -ASCVD risk score: The 10-year ASCVD risk score Mikey Bussing DC Brooke Bonito., et al., 2013) is: 31.7%   Values used to calculate the score:     Age: 10 years     Sex: Male     Is Non-Hispanic African American: Yes     Diabetic: No     Tobacco smoker: Yes     Systolic Blood Pressure: 287 mmHg     Is BP treated: Yes     HDL Cholesterol: 48 mg/dL     Total Cholesterol: 142 mg/dL   -given his ASCVD risk, briefly discussed statins. He would like to work on blood pressure first.  Plan for follow up: 1 mos to monitor BP  Total time of encounter: 61 minutes total time of encounter, including 61 minutes spent in face-to-face patient care. This time includes coordination of care and counseling regarding CV risk, blood pressure, syncope, and management recommendations. Remainder of non-face-to-face time involved reviewing chart documents/testing relevant to the patient encounter and documentation in the medical record.  Buford Dresser, MD, PhD   [facility]   Medication Adjustments/Labs and Tests Ordered: Current medicines are reviewed at length with the patient today.  Concerns regarding medicines are outlined above.  Orders Placed This Encounter  Procedures  . EKG 12-Lead   No orders of the defined types were placed in this encounter.   Patient Instructions  Medication Instructions:  Your Physician recommend you continue on your current medication as directed.    *If you need a refill on your cardiac medications before your next appointment, please call your pharmacy*   Lab Work: None ordered   Testing/Procedures: None ordered    Follow-Up: At [facility], you and your health needs are our priority.  As part of our continuing mission to provide you with exceptional heart care, we have created designated Provider Care Teams.  These Care Teams include your primary Cardiologist (physician) and Advanced Practice Providers (APPs -  Physician Assistants and Nurse Practitioners) who all work together to provide you with the care you need, when you need it.  We recommend signing up for the patient portal called "MyChart".  Sign up information is provided on this After Visit Summary.  MyChart is used to connect with patients for Virtual Visits (Telemedicine).  Patients are able to view lab/test results, encounter notes, upcoming appointments, etc.  Non-urgent messages can be sent to your provider as well.   To learn more about what you can do with MyChart, go to [email or web address].    Your next appointment:   1 month(s)  The format for your next appointment:   In Person  Provider:   Buford Dresser, MD  how to check blood pressure:  -sit comfortably in a chair, feet uncrossed and flat on floor, for 5-10 minutes  -arm ideally should rest at the level of the heart. However, arm should be relaxed and not tense (for example, do not hold the arm up unsupported)  -avoid exercise, caffeine, and tobacco for at least 30 minutes prior to BP reading  -don't take BP cuff reading over clothes (always place on skin directly)  -I prefer to know how well the medication is working, so I would like you to take your readings 1-2 hours after taking your blood pressure medication if possible   Signed, Buford Dresser, MD PhD 06/13/2020   [place]

## 2020-06-13 NOTE — Patient Instructions (Addendum)
Medication Instructions:  Your Physician recommend you continue on your current medication as directed.    *If you need a refill on your cardiac medications before your next appointment, please call your pharmacy*   Lab Work: None ordered   Testing/Procedures: None ordered    Follow-Up: At [facility], you and your health needs are our priority.  As part of our continuing mission to provide you with exceptional heart care, we have created designated Provider Care Teams.  These Care Teams include your primary Cardiologist (physician) and Advanced Practice Providers (APPs -  Physician Assistants and Nurse Practitioners) who all work together to provide you with the care you need, when you need it.  We recommend signing up for the patient portal called "MyChart".  Sign up information is provided on this After Visit Summary.  MyChart is used to connect with patients for Virtual Visits (Telemedicine).  Patients are able to view lab/test results, encounter notes, upcoming appointments, etc.  Non-urgent messages can be sent to your provider as well.   To learn more about what you can do with MyChart, go to [email or web address].    Your next appointment:   1 month(s)  The format for your next appointment:   In Person  Provider:   Buford Dresser, MD  how to check blood pressure:  -sit comfortably in a chair, feet uncrossed and flat on floor, for 5-10 minutes  -arm ideally should rest at the level of the heart. However, arm should be relaxed and not tense (for example, do not hold the arm up unsupported)  -avoid exercise, caffeine, and tobacco for at least 30 minutes prior to BP reading  -don't take BP cuff reading over clothes (always place on skin directly)  -I prefer to know how well the medication is working, so I would like you to take your readings 1-2 hours after taking your blood pressure medication if possible

## 2020-06-15 ENCOUNTER — Ambulatory Visit: Payer: Medicare HMO | Attending: Internal Medicine

## 2020-06-15 DIAGNOSIS — Z23 Encounter for immunization: Secondary | ICD-10-CM

## 2020-06-15 NOTE — Progress Notes (Signed)
   [id number]  Name:  Justin Dunn.    MRN: [id number] DOB: 1961-01-18  06/15/2020  Mr. Justin Dunn was observed post Covid-19 immunization for 30 minutes based on pre-vaccination screening without incident. He was provided with Vaccine Information Sheet and instruction to access the V-Safe system.   Mr. Justin Dunn was instructed to call 911 with any severe reactions post vaccine: Marland Kitchen Difficulty breathing  . Swelling of face and throat  . A fast heartbeat  . A bad rash all over body  . Dizziness and weakness

## 2020-06-17 ENCOUNTER — Telehealth: Payer: Self-pay

## 2020-06-17 DIAGNOSIS — Z0000 Encounter for general adult medical examination without abnormal findings: Secondary | ICD-10-CM

## 2020-06-17 NOTE — Telephone Encounter (Signed)
Called patient to discuss concerns regarding financial constraints to affording medications and medical procedures. Patient stated that he was not prescribed any new medications during last visit and was given a blood pressure cuff and do not need assistance with medications at this time.  Patient was asked if they were interested in health coaching for smoking cessation. Patient stated that any assistance will help.  Patient inquired about sleep study per Dr. Vonna Kotyk recommendation. Patient asked for Care Guide to check on a referral for that procedure. Care Guide will reach out to Dr. Carlota Raspberry for insight.   Will call patient back with update and to discuss health coaching for smoking cessation further.

## 2020-06-18 ENCOUNTER — Other Ambulatory Visit: Payer: Self-pay | Admitting: Family Medicine

## 2020-06-18 ENCOUNTER — Telehealth: Payer: Self-pay

## 2020-06-18 DIAGNOSIS — Z0000 Encounter for general adult medical examination without abnormal findings: Secondary | ICD-10-CM

## 2020-06-18 DIAGNOSIS — R0683 Snoring: Secondary | ICD-10-CM

## 2020-06-18 DIAGNOSIS — G4700 Insomnia, unspecified: Secondary | ICD-10-CM

## 2020-06-18 NOTE — Telephone Encounter (Signed)
Called patient to inform him that Dr. Carlota Raspberry put in a referral for his sleep study to be conducted and to follow up on health coaching for smoking cessation. Patient's voice mailbox was full and was unable to leave a message. Will call patient again on 06/19/20.

## 2020-06-18 NOTE — Progress Notes (Signed)
Note received form cardiology office - may be able to attend sleep study at this time - ordered.

## 2020-07-23 ENCOUNTER — Institutional Professional Consult (permissible substitution): Payer: Medicare HMO | Admitting: Neurology

## 2020-07-29 ENCOUNTER — Ambulatory Visit: Payer: Medicare HMO | Admitting: Cardiology

## 2020-07-31 ENCOUNTER — Telehealth: Payer: Self-pay | Admitting: Family Medicine

## 2020-07-31 ENCOUNTER — Telehealth: Payer: Self-pay | Admitting: *Deleted

## 2020-07-31 NOTE — Telephone Encounter (Signed)
Ok to call pt back he is ready to schedule AWV ok to call tomorrow

## 2020-07-31 NOTE — Telephone Encounter (Signed)
Schedule AWV.  

## 2020-08-06 ENCOUNTER — Ambulatory Visit (INDEPENDENT_AMBULATORY_CARE_PROVIDER_SITE_OTHER): Payer: Medicare HMO | Admitting: Emergency Medicine

## 2020-08-06 VITALS — BP 144/90 | HR 89 | Temp 98.3°F | Resp 16 | Ht 71.0 in | Wt 225.8 lb

## 2020-08-06 DIAGNOSIS — Z0000 Encounter for general adult medical examination without abnormal findings: Secondary | ICD-10-CM

## 2020-08-06 DIAGNOSIS — Z23 Encounter for immunization: Secondary | ICD-10-CM | POA: Diagnosis not present

## 2020-08-06 NOTE — Patient Instructions (Addendum)
Thank you for taking time to come for your Medicare Wellness Visit. I appreciate your ongoing commitment to your health goals. Please review the following plan we discussed and let me know if I can assist you in the future.  Dmoni Fortson LPN  Advance Directive  Advance directives are legal documents that let you make choices ahead of time about your health care and medical treatment in case you become unable to communicate for yourself. Advance directives are a way for you to make known your wishes to family, friends, and health care providers. This can let others know about your end-of-life care if you become unable to communicate. Discussing and writing advance directives should happen over time rather than all at once. Advance directives can be changed depending on your situation and what you want, even after you have signed the advance directives. There are different types of advance directives, such as:  Medical power of attorney.  Living will.  Do not resuscitate (DNR) or do not attempt resuscitation (DNAR) order. Health care proxy and medical power of attorney A health care proxy is also called a health care agent. This is a person who is appointed to make medical decisions for you in cases where you are unable to make the decisions yourself. Generally, people choose someone they know well and trust to represent their preferences. Make sure to ask this person for an agreement to act as your proxy. A proxy may have to exercise judgment in the event of a medical decision for which your wishes are not known. A medical power of attorney is a legal document that names your health care proxy. Depending on the laws in your state, after the document is written, it may also need to be:  Signed.  Notarized.  Dated.  Copied.  Witnessed.  Incorporated into your medical record. You may also want to appoint someone to manage your money in a situation in which you are unable to do so. This is  called a durable power of attorney for finances. It is a separate legal document from the durable power of attorney for health care. You may choose the same person or someone different from your health care proxy to act as your agent in money matters. If you do not appoint a proxy, or if there is a concern that the proxy is not acting in your best interests, a court may appoint a guardian to act on your behalf. Living will A living will is a set of instructions that state your wishes about medical care when you cannot express them yourself. Health care providers should keep a copy of your living will in your medical record. You may want to give a copy to family members or friends. To alert caregivers in case of an emergency, you can place a card in your wallet to let them know that you have a living will and where they can find it. A living will is used if you become:  Terminally ill.  Disabled.  Unable to communicate or make decisions. Items to consider in your living will include:  To use or not to use life-support equipment, such as dialysis machines and breathing machines (ventilators).  A DNR or DNAR order. This tells health care providers not to use cardiopulmonary resuscitation (CPR) if breathing or heartbeat stops.  To use or not to use tube feeding.  To be given or not to be given food and fluids.  Comfort (palliative) care when the goal becomes comfort rather than   a cure.  Donation of organs and tissues. A living will does not give instructions for distributing your money and property if you should pass away. DNR or DNAR A DNR or DNAR order is a request not to have CPR in the event that your heart stops beating or you stop breathing. If a DNR or DNAR order has not been made and shared, a health care provider will try to help any patient whose heart has stopped or who has stopped breathing. If you plan to have surgery, talk with your health care provider about how your DNR or DNAR  order will be followed if problems occur. What if I do not have an advance directive? If you do not have an advance directive, some states assign family decision makers to act on your behalf based on how closely you are related to them. Each state has its own laws about advance directives. You may want to check with your health care provider, attorney, or state representative about the laws in your state. Summary  Advance directives are the legal documents that allow you to make choices ahead of time about your health care and medical treatment in case you become unable to tell others about your care.  The process of discussing and writing advance directives should happen over time. You can change the advance directives, even after you have signed them.  Advance directives include DNR or DNAR orders, living wills, and designating an agent as your medical power of attorney. This information is not intended to replace advice given to you by your health care provider. Make sure you discuss any questions you have with your health care provider. Document Revised: 03/16/2019 Document Reviewed: 03/16/2019 Elsevier Patient Education  2020 [employer] Preventive Care 69-29 Years Old, Male Preventive care refers to lifestyle choices and visits with your health care provider that can promote health and wellness. This includes:  A yearly physical exam. This is also called an annual well check.  Regular dental and eye exams.  Immunizations.  Screening for certain conditions.  Healthy lifestyle choices, such as eating a healthy diet, getting regular exercise, not using drugs or products that contain nicotine and tobacco, and limiting alcohol use. What can I expect for my preventive care visit? Physical exam Your health care provider will check:  Height and weight. These may be used to calculate body mass index (BMI), which is a measurement that tells if you are at a healthy weight.  Heart rate  and blood pressure.  Your skin for abnormal spots. Counseling Your health care provider may ask you questions about:  Alcohol, tobacco, and drug use.  Emotional well-being.  Home and relationship well-being.  Sexual activity.  Eating habits.  Work and work Statistician. What immunizations do I need?  Influenza (flu) vaccine  This is recommended every year. Tetanus, diphtheria, and pertussis (Tdap) vaccine  You may need a Td booster every 10 years. Varicella (chickenpox) vaccine  You may need this vaccine if you have not already been vaccinated. Zoster (shingles) vaccine  You may need this after age 53. Measles, mumps, and rubella (MMR) vaccine  You may need at least one dose of MMR if you were born in 1957 or later. You may also need a second dose. Pneumococcal conjugate (PCV13) vaccine  You may need this if you have certain conditions and were not previously vaccinated. Pneumococcal polysaccharide (PPSV23) vaccine  You may need one or two doses if you smoke cigarettes or if you have certain conditions.  Meningococcal conjugate (MenACWY) vaccine  You may need this if you have certain conditions. Hepatitis A vaccine  You may need this if you have certain conditions or if you travel or work in places where you may be exposed to hepatitis A. Hepatitis B vaccine  You may need this if you have certain conditions or if you travel or work in places where you may be exposed to hepatitis B. Haemophilus influenzae type b (Hib) vaccine  You may need this if you have certain risk factors. Human papillomavirus (HPV) vaccine  If recommended by your health care provider, you may need three doses over 6 months. You may receive vaccines as individual doses or as more than one vaccine together in one shot (combination vaccines). Talk with your health care provider about the risks and benefits of combination vaccines. What tests do I need? Blood tests  Lipid and cholesterol  levels. These may be checked every 5 years, or moreequently if you are over 7 years old.  Hepatitis C test.  Hepatitis B test. Screening  Lung cancer screening. You may have this screening every year starting at age 48 if you have a 30-pack-year history of smoking and currently smoke or have quit within the past 15 years.  Prostate cancer screening. Recommendations will vary depending on your family history and other risks.  Colorectal cancer screening. All adults should have this screening starting at age 64 and continuing until age 7. Your health care provider may recommend screening at age 42 if you are at increased risk. You will have tests every 1-10 years, depending on your results and the type of screening test.  Diabetes screening. This is done by checking your blood sugar (glucose) after you have not eaten for a while (fasting). You may have this done every 1-3 years.  Sexually transmitted disease (STD) testing. Follow these instructions at home: Eating and drinking  Eat a diet that includes fresh fruits and vegetables, whole grains, lean protein, and low-fat dairy products.  Take vitamin and mineral supplements as recommended by your health care provider.  Do not drink alcohol if your health care provider tells you not to drink.  If you drink alcohol: ? Limit how much you have to 0-2 drinks a day. ? Be aware of how much alcohol is in your drink. In the [place] one drink equals one 12 oz bottle of beer (355 mL), one 5 oz glass of wine (148 mL), or one 1 oz glass of hard liquor (44 mL). Lifestyle  Take daily care of your teeth and gums.  Stay active. Exercise for at least 30 minutes on 5 or more days each week.  Do not use any products that contain nicotine or tobacco, such as cigarettes, e-cigarettes, and chewing tobacco. If you need help quitting, ask your health care provider.  If you are sexually active, practice safe sex. Use a condom or other form of protection to  prevent STIs (sexually transmitted infections).  Talk with your health care provider about taking a low-dose aspirin every day starting at age 31. What's next?  Go to your health care provider once a year for a well check visit.  Ask your health care provider how often you should have your eyes and teeth checked.  Stay up to date on all vaccines. This information is not intended to replace advice given to you by your health care provider. Make sure you discuss any questions you have with your health care provider. Document Revised: 08/11/2018 Document Reviewed:  08/11/2018 Elsevier Patient Education  2020 [employer] Eating Following a healthy eating pattern may help you to achieve and maintain a healthy body weight, reduce the risk of chronic disease, and live a long and productive life. It is important to follow a healthy eating pattern at an appropriate calorie level for your body. Your nutritional needs should be met primarily through food by choosing a variety of nutrient-rich foods. What are tips for following this plan? Reading food labels  Read labels and choose the following: ? Reduced or low sodium. ? Juices with 100% fruit juice. ? Foods with low saturated fats and high polyunsaturated and monounsaturated fats. ? Foods with whole grains, such as whole wheat, cracked wheat, brown rice, and wild rice. ? Whole grains that are fortified with folic acid. This is recommended for women who are pregnant or who want to become pregnant.  Read labels and avoid the following: ? Foods with a lot of added sugars. These include foods that contain brown sugar, corn sweetener, corn syrup, dextrose, fructose, glucose, high-fructose corn syrup, honey, invert sugar, lactose, malt syrup, maltose, molasses, raw sugar, sucrose, trehalose, or turbinado sugar.  Do not eat more than the following amounts of added sugar per day:  6 teaspoons (25 g) for women.  9 teaspoons (38 g) for  men. ? Foods that contain processed or refined starches and grains. ? Refined grain products, such as white flour, degermed cornmeal, white bread, and white rice. Shopping  Choose nutrient-rich snacks, such as vegetables, whole fruits, and nuts. Avoid high-calorie and high-sugar snacks, such as potato chips, fruit snacks, and candy.  Use oil-based dressings and spreads on foods instead of solid fats such as butter, stick margarine, or cream cheese.  Limit pre-made sauces, mixes, and "instant" products such as flavored rice, instant noodles, and ready-made pasta.  Try more plant-protein sources, such as tofu, tempeh, black beans, edamame, lentils, nuts, and seeds.  Explore eating plans such as the Mediterranean diet or vegetarian diet. Cooking  Use oil to saut or stir-fry foods instead of solid fats such as butter, stick margarine, or lard.  Try baking, boiling, grilling, or broiling instead of frying.  Remove the fatty part of meats before cooking.  Steam vegetables in water or broth. Meal planning   At meals, imagine dividing your plate into fourths: ? One-half of your plate is fruits and vegetables. ? One-fourth of your plate is whole grains. ? One-fourth of your plate is protein, especially lean meats, poultry, eggs, tofu, beans, or nuts.  Include low-fat dairy as part of your daily diet. Lifestyle  Choose healthy options in all settings, including home, work, school, restaurants, or stores.  Prepare your food safely: ? Wash your hands after handling raw meats. ? Keep food preparation surfaces clean by regularly washing with hot, soapy water. ? Keep raw meats separate from ready-to-eat foods, such as fruits and vegetables. ? Cook seafood, meat, poultry, and eggs to the recommended internal temperature. ? Store foods at safe temperatures. In general:  Keep cold foods at 79F (4.4C) or below.  Keep hot foods at 179F (60C) or above.  Keep your freezer at [facility]  (-17.8C) or below.  Foods are no longer safe to eat when they have been between the temperatures of 40-179F (4.4-60C) for more than 2 hours. What foods should I eat? Fruits Aim to eat 2 cup-equivalents of fresh, canned (in natural juice), or frozen fruits each day. Examples of 1 cup-equivalent of fruit include 1 small apple,  8 large strawberries, 1 cup canned fruit,  cup dried fruit, or 1 cup 100% juice. Vegetables Aim to eat 2-3 cup-equivalents of fresh and frozen vegetables each day, including different varieties and colors. Examples of 1 cup-equivalent of vegetables include 2 medium carrots, 2 cups raw, leafy greens, 1 cup chopped vegetable (raw or cooked), or 1 medium baked potato. Grains Aim to eat 6 ounce-equivalents of whole grains each day. Examples of 1 ounce-equivalent of grains include 1 slice of bread, 1 cup ready-to-eat cereal, 3 cups popcorn, or  cup cooked rice, pasta, or cereal. Meats and other proteins Aim to eat 5-6 ounce-equivalents of protein each day. Examples of 1 ounce-equivalent of protein include 1 egg, 1/2 cup nuts or seeds, or 1 tablespoon (16 g) peanut butter. A cut of meat or fish that is the size of a deck of cards is about 3-4 ounce-equivalents.  Of the protein you eat each week, try to have at least 8 ounces come from seafood. This includes salmon, trout, herring, and anchovies. Dairy Aim to eat 3 cup-equivalents of fat-free or low-fat dairy each day. Examples of 1 cup-equivalent of dairy include 1 cup (240 mL) milk, 8 ounces (250 g) yogurt, 1 ounces (44 g) natural cheese, or 1 cup (240 mL) fortified soy milk. Fats and oils  Aim for about 5 teaspoons (21 g) per day. Choose monounsaturated fats, such as canola and olive oils, avocados, peanut butter, and most nuts, or polyunsaturated fats, such as sunflower, corn, and soybean oils, walnuts, pine nuts, sesame seeds, sunflower seeds, and flaxseed. Beverages  Aim for six 8-oz glasses of water per day. Limit  coffee to three to five 8-oz cups per day.  Limit caffeinated beverages that have added calories, such as soda and energy drinks.  Limit alcohol intake to no more than 1 drink a day for nonpregnant women and 2 drinks a day for men. One drink equals 12 oz of beer (355 mL), 5 oz of wine (148 mL), or 1 oz of hard liquor (44 mL). Seasoning and other foods  Avoid adding excess amounts of salt to your foods. Try flavoring foods with herbs and spices instead of salt.  Avoid adding sugar to foods.  Try using oil-based dressings, sauces, and spreads instead of solid fats. This information is based on general [place] nutrition guidelines. For more information, visit [employer]. Exact amounts may vary based on your nutrition needs. Summary  A healthy eating plan may help you to maintain a healthy weight, reduce the risk of chronic diseases, and stay active throughout your life.  Plan your meals. Make sure you eat the right portions of a variety of nutrient-rich foods.  Try baking, boiling, grilling, or broiling instead of frying.  Choose healthy options in all settings, including home, work, school, restaurants, or stores. This information is not intended to replace advice given to you by your health care provider. Make sure you discuss any questions you have with your health care provider. Document Revised: 11/29/2017 Document Reviewed: 11/29/2017 Elsevier Patient Education  [place].

## 2020-08-06 NOTE — Progress Notes (Signed)
Presents today for [employer] Visit   Date of last exam:04-04-2020  Interpreter used for this visit? No  Patient was seen in clinic at [place] at [facility]  Patient Care Team: Buford Dresser, MD as PCP - Cardiology (Cardiology) Myrlene Broker, MD as Attending Physician (Urology)   Other items to address today:   Discussed immunizations Discussed Eye/Dental    Other Screening: Last screening for diabetes:04/04/2020 Last lipid screening: 11-27-2019  ADVANCE DIRECTIVES: Discussed: yes On File: no Materials Provided: yes  Immunization status:  Immunization History  Administered Date(s) Administered  . Influenza,inj,Quad PF,6+ Mos 05/12/2017, 06/14/2018  . PFIZER SARS-COV-2 Vaccination 11/22/2019, 12/13/2019, 06/15/2020  . PPD Test 08/19/2015, 09/08/2016     Health Maintenance Due  Topic Date Due  . INFLUENZA VACCINE  03/31/2020     Functional Status Survey: Is the patient deaf or have difficulty hearing?: No Does the patient have difficulty seeing, even when wearing glasses/contacts?: No Does the patient have difficulty concentrating, remembering, or making decisions?: No Does the patient have difficulty walking or climbing stairs?: Yes (feels off balace sometimes.  back pain) Does the patient have difficulty dressing or bathing?: No Does the patient have difficulty doing errands alone such as visiting a doctor's office or shopping?: No   6CIT Screen 08/06/2020 09/09/2017  What Year? 0 points 0 points  What month? 0 points 0 points  What time? 0 points 0 points  Count back from 20 0 points 0 points  Months in reverse 0 points 0 points  Repeat phrase 0 points 0 points  Total Score 0 0        Clinical Support from 08/06/2020 in [facility] at [place]  AUDIT-C Score 7       Home Environment:   Lives in one story home No trouble climbing stairs No grab bars No scattered rugs Adequate lighting/ no clutter   Patient  Active Problem List   Diagnosis Date Noted  . History of syncope 06/13/2020  . Vitamin B12 deficiency (non anemic) 05/04/2019  . Urinary urgency 05/04/2019  . Tobacco use disorder 05/04/2019  . Snoring 05/04/2019  . Reduced libido 05/04/2019  . Personal history of prostate cancer 05/04/2019  . Low testosterone in male 05/04/2019  . Acquired bladder diverticulum 05/04/2019  . Family history of malignant neoplasm of prostate 05/04/2019  . Excessive daytime sleepiness 05/04/2019  . Erectile dysfunction following interstitial seed therapy 05/04/2019  . Urge incontinence of urine 05/04/2019  . Degenerative disc disease, lumbar 06/01/2016  . Degenerative disc disease, cervical 06/01/2016  . Insomnia due to other mental disorder 06/01/2016  . Adjustment disorder with mixed anxiety and depressed mood 06/01/2016  . Essential (primary) hypertension 02/07/2015  . Malignant neoplasm of prostate ([place]) 01/09/2014     Past Medical History:  Diagnosis Date  . Anxiety   . At risk for sleep apnea    STOP-BANG= 4   SENT TO PCP 04-02-2014  . Chronic low back pain   . Depression   . ED (erectile dysfunction)   . History of colon polyps   . History of gastric ulcer    as child  . Hypertension   . Hypogonadism male   . Incomplete bladder emptying   . Lumbar spine scoliosis   . Premature ejaculation   . Prostate cancer [facility])    Dr Tammi Klippel ( oncologist) and Dr Loel Lofty [facility])   . Retention of urine, unspecified   . Sleep apnea    states  never told to wear CPAP  . Wears glasses      Past Surgical History:  Procedure Laterality Date  . APPENDECTOMY  age 36  . COLONOSCOPY W/ POLYPECTOMY  july 2015  . LUMBAR FUSION  04/ 2012   and rod  . PROSTATE BIOPSY    . RADIOACTIVE SEED IMPLANT N/A 04/06/2014   Procedure: RADIOACTIVE SEED IMPLANT;  Surgeon: [name], MD;  Location: [facility];  Service: Urology;  Laterality: N/A;  dr portable      Family History    Problem Relation Age of Onset  . Prostate cancer Father   . Hypertension Mother   . Pulmonary fibrosis Mother   . Arthritis Mother   . Diabetes Mother   . Deep vein thrombosis Sister      Social History   Socioeconomic History  . Marital status: Divorced    Spouse name: Not on file  . Number of children: Not on file  . Years of education: Not on file  . Highest education level: Some college, no degree  Occupational History  . Occupation: disability    Comment: DDD lumbar spine; 2013  Tobacco Use  . Smoking status: Current Some Day Smoker    Packs/day: 1.00    Years: 10.00    Pack years: 10.00    Types: Cigarettes, Cigars  . Smokeless tobacco: Never Used  . Tobacco comment: currently smokes 2 small cigar per day/  quit cigarettes 2011  Vaping Use  . Vaping Use: Never used  Substance and Sexual Activity  . Alcohol use: Yes    Alcohol/week: 0.0 standard drinks    Comment: 1-2 glasses of wine daily  . Drug use: Yes    Types: Marijuana  . Sexual activity: Never    Birth control/protection: Abstinence  Other Topics Concern  . Not on file  Social History Narrative   Marital status: divorced; dating in 2019      Children: one foster son (autistic_      Lives: with foster son      Employment: disability from DDD lumbar      Tobacco: none      Alcohol: none      Drugs:      Exercise: minimal in 2019.      ADLs; drives; independent with ADLs.      Advanced Directives: none; FULL CODE.  Justin [phone] Justin Dunn [phone].   Social Determinants of Health   Financial Resource Strain:   . Difficulty of Paying Living Expenses: Not on file  Food Insecurity:   . Worried About Charity fundraiser in the Last Year: Not on file  . Ran Out of Food in the Last Year: Not on file  Transportation Needs:   . Lack of Transportation (Medical): Not on file  . Lack of Transportation (Non-Medical): Not on file  Physical Activity:   . Days of Exercise per  Week: Not on file  . Minutes of Exercise per Session: Not on file  Stress:   . Feeling of Stress : Not on file  Social Connections:   . Frequency of Communication with Friends and Family: Not on file  . Frequency of Social Gatherings with Friends and Family: Not on file  . Attends Religious Services: Not on file  . Active Member of Clubs or Organizations: Not on file  . Attends Archivist Meetings: Not on file  . Marital Status: Not on file  Intimate Partner Violence:   . Fear of  Current or Ex-Partner: Not on file  . Emotionally Abused: Not on file  . Physically Abused: Not on file  . Sexually Abused: Not on file     No Known Allergies   Prior to Admission medications   Medication Sig Start Date End Date Taking? Authorizing Provider  anastrozole (ARIMIDEX) 1 MG tablet Take 1 mg by mouth daily.    Yes [provider]  b complex vitamins tablet Take 1 tablet by mouth daily.   Yes [provider]  BLACK CURRANT SEED OIL PO Take by mouth.   Yes [provider]  Calcium-Cholecalciferol (Justin-CALCIUM PO) Take by mouth.    Yes [provider]  Cholecalciferol (VITAMIN D3) 1000 units CAPS Take by mouth.    Yes [provider]  citalopram (CELEXA) 40 MG tablet Take 1 tablet (40 mg total) by mouth daily. 04/04/20  Yes Wendie Agreste, MD  enalapril (VASOTEC) 20 MG tablet Take 1 tablet (20 mg total) by mouth 2 (two) times daily. 03/21/20  Yes Wendie Agreste, MD  gabapentin (NEURONTIN) 300 MG capsule Take 3 capsules (900 mg total) by mouth 3 (three) times daily. 10/27/19  Yes Maximiano Coss, NP  hydrochlorothiazide (MICROZIDE) 12.5 MG capsule Take 1 capsule (12.5 mg total) by mouth daily. 03/21/20  Yes Wendie Agreste, MD  Multiple Vitamins-Minerals (ZINC PO) Take by mouth.   Yes [provider]  Multiple Vitamins-Minerals-FA (VITATAB MV PO) Take by mouth.   Yes [provider]  Olopatadine HCl (PATADAY) 0.2 % SOLN  Apply 1 drop to eye daily. 11/27/19  Yes Stallings, Zoe A, MD  peppermint oil liquid by Does not apply route as needed.    Yes [provider]  traZODone (DESYREL) 100 MG tablet Take 1 tablet (100 mg total) by mouth at bedtime. 04/04/20  Yes Wendie Agreste, MD  Turmeric (RA TURMERIC) 500 MG CAPS Take 1 tablet by mouth. Reported on 08/19/2015   Yes [provider]  amLODipine (NORVASC) 10 MG tablet Take 1 tablet (10 mg total) by mouth daily. Patient not taking: Reported on 08/06/2020 04/07/19   Forrest Moron, MD  escitalopram (LEXAPRO) 10 MG tablet Take 10 mg by mouth daily.    [provider]  meloxicam (MOBIC) 15 MG tablet Take 1 tablet (15 mg total) by mouth daily as needed for pain. Patient not taking: Reported on 08/06/2020 08/14/19   Rutherford Guys, MD  zolpidem (AMBIEN) 10 MG tablet Take 1 tablet (10 mg total) by mouth at bedtime as needed for sleep. Patient not taking: Reported on 08/06/2020 04/04/20   Wendie Agreste, MD     Depression screen [facility] 2/9 08/06/2020 04/04/2020 03/21/2020 03/21/2020 02/28/2020  Decreased Interest 0 0 0 0 0  Down, Depressed, Hopeless 0 1 0 - 0  PHQ - 2 Score 0 1 0 0 0  Altered sleeping - - - - -  Tired, decreased energy - - - - -  Change in appetite - - - - -  Feeling bad or failure about yourself  - - - - -  Trouble concentrating - - - - -  Moving slowly or fidgety/restless - - - - -  Suicidal thoughts - - - - -  PHQ-9 Score - - - - -  Difficult doing work/chores - - - - -  Some recent data might be hidden     Fall Risk  08/06/2020 04/04/2020 03/21/2020 03/21/2020 02/28/2020  Falls in the past year? 1 0 1 0  0  Number falls in past yr: 0 0 0 - 0  Injury with Fall? 0 0 0 - 0  Comment passed out was seen by physician - - - -  Risk for fall due to : - No Fall Risks - - -  Risk for fall due to: Comment - - - - -  Follow up Falls evaluation completed;Education provided Falls evaluation completed Falls evaluation completed Falls  evaluation completed Falls evaluation completed      PHYSICAL EXAM: BP (!) 144/90 (BP Location: Right Arm, Patient Position: Sitting, Cuff Size: Large)   Pulse 89   Temp 98.3 F (36.8 C) (Oral)   Resp 16   Ht 5\' 11"  (1.803 m)   Wt 225 lb 12.8 oz (102.4 kg)   SpO2 98%   BMI 31.49 kg/m    Wt Readings from Last 3 Encounters:  08/06/20 225 lb 12.8 oz (102.4 kg)  06/13/20 225 lb 3.2 oz (102.2 kg)  04/04/20 214 lb 3.2 oz (97.2 kg)       Education/Counseling provided regarding diet and exercise, prevention of chronic diseases, smoking/tobacco cessation, if applicable, and reviewed "Covered Medicare Preventive Services."

## 2020-08-08 ENCOUNTER — Ambulatory Visit (INDEPENDENT_AMBULATORY_CARE_PROVIDER_SITE_OTHER): Payer: Medicare HMO | Admitting: Family Medicine

## 2020-08-08 ENCOUNTER — Other Ambulatory Visit: Payer: Self-pay

## 2020-08-08 ENCOUNTER — Ambulatory Visit
Admission: RE | Admit: 2020-08-08 | Discharge: 2020-08-08 | Disposition: A | Discharge status: Home / Self Care | Payer: Medicare HMO | Source: Ambulatory Visit | Attending: Family Medicine | Admitting: Family Medicine

## 2020-08-08 ENCOUNTER — Encounter: Payer: Self-pay | Admitting: Family Medicine

## 2020-08-08 VITALS — BP 128/83 | HR 82 | Temp 97.4°F | Ht 71.0 in | Wt 226.0 lb

## 2020-08-08 DIAGNOSIS — G8929 Other chronic pain: Secondary | ICD-10-CM | POA: Diagnosis not present

## 2020-08-08 DIAGNOSIS — R739 Hyperglycemia, unspecified: Secondary | ICD-10-CM | POA: Diagnosis not present

## 2020-08-08 DIAGNOSIS — M5441 Lumbago with sciatica, right side: Secondary | ICD-10-CM | POA: Diagnosis not present

## 2020-08-08 DIAGNOSIS — M545 Low back pain, unspecified: Secondary | ICD-10-CM | POA: Diagnosis not present

## 2020-08-08 LAB — GLUCOSE, POCT (MANUAL RESULT ENTRY): POC Glucose: 103 mg/dl — AB (ref 70–99)

## 2020-08-08 MED ORDER — PREDNISONE 20 MG PO TABS: 40.0000 mg | ORAL_TABLET | Freq: Every day | ORAL | 0 refills | Status: DC

## 2020-08-08 NOTE — Patient Instructions (Addendum)
I will refer you to Dr. Lynann Bologna, have back xray today- [address]. Continue gabapentin for now,  If blood sugar is ok, can try prednisone. Return to the clinic or go to the nearest emergency room if any of your symptoms worsen or new symptoms occur.   Chronic Back Pain When back pain lasts longer than 3 months, it is called chronic back pain.The cause of your back pain may not be known. Some common causes include:  Wear and tear (degenerative disease) of the bones, ligaments, or disks in your back.  Inflammation and stiffness in your back (arthritis). People who have chronic back pain often go through certain periods in which the pain is more intense (flare-ups). Many people can learn to manage the pain with home care. Follow these instructions at home: Pay attention to any changes in your symptoms. Take these actions to help with your pain: Activity   Avoid bending and other activities that make the problem worse.  Maintain a proper position when standing or sitting: ? When standing, keep your upper back and neck straight, with your shoulders pulled back. Avoid slouching. ? When sitting, keep your back straight and relax your shoulders. Do not round your shoulders or pull them backward.  Do not sit or stand in one place for long periods of time.  Take brief periods of rest throughout the day. This will reduce your pain. Resting in a lying or standing position is usually better than sitting to rest.  When you are resting for longer periods, mix in some mild activity or stretching between periods of rest. This will help to prevent stiffness and pain.  Get regular exercise. Ask your health care provider what activities are safe for you.  Do not lift anything that is heavier than 10 lb (4.5 kg). Always use proper lifting technique, which includes: ? Bending your knees. ? Keeping the load close to your body. ? Avoiding twisting.  Sleep on a firm mattress in a  comfortable position. Try lying on your side with your knees slightly bent. If you lie on your back, put a pillow under your knees. Managing pain  If directed, apply ice to the painful area. Your health care provider may recommend applying ice during the first 24-48 hours after a flare-up begins. ? Put ice in a plastic bag. ? Place a towel between your skin and the bag. ? Leave the ice on for 20 minutes, 2-3 times per day.  If directed, apply heat to the affected area as often as told by your health care provider. Use the heat source that your health care provider recommends, such as a moist heat pack or a heating pad. ? Place a towel between your skin and the heat source. ? Leave the heat on for 20-30 minutes. ? Remove the heat if your skin turns bright red. This is especially important if you are unable to feel pain, heat, or cold. You may have a greater risk of getting burned.  Try soaking in a warm tub.  Take over-the-counter and prescription medicines only as told by your health care provider.  Keep all follow-up visits as told by your health care provider. This is important. Contact a health care provider if:  You have pain that is not relieved with rest or medicine. Get help right away if:  You have weakness or numbness in one or both of your legs or feet.  You have trouble controlling your bladder or your bowels.  You have  nausea or vomiting.  You have pain in your abdomen.  You have shortness of breath or you faint. This information is not intended to replace advice given to you by your health care provider. Make sure you discuss any questions you have with your health care provider. Document Revised: 12/08/2018 Document Reviewed: 02/24/2017 Elsevier Patient Education  [employer].   If you have lab work done today you will be contacted with your lab results within the next 2 weeks.  If you have not heard from [place] then please contact us. The fastest way to get your  results is to register for My Chart.   IF you received an x-ray today, you will receive an invoice from [facility]. Please contact [facility] at [phone] with questions or concerns regarding your invoice.   IF you received labwork today, you will receive an invoice from [facility]. Please contact [facility] at [phone] with questions or concerns regarding your invoice.   Our billing staff will not be able to assist you with questions regarding bills from these companies.  You will be contacted with the lab results as soon as they are available. The fastest way to get your results is to activate your My Chart account. Instructions are located on the last page of this paperwork. If you have not heard from [place] regarding the results in 2 weeks, please contact this office.

## 2020-08-08 NOTE — Progress Notes (Signed)
Subjective:  Patient ID: Justin Dunn., male    DOB: 20-Jul-1961  Age: 59 y.o. MRN: [id number]  CC:  Chief Complaint  Patient presents with  . Back Pain    Pt reports pain that has been going on for some time now. Pt states pain is located on the R side of his lower back and pain shoots now the R  leg. PT states he really noticed the pain while doing normal house hold chores. Pt reports having difficulty walking at time when pain is traveling into the leg. Pt reports he has been taking gabapentin for some time now for this condition and state he thinks he may have developed a tolerance to the medication.     HPI Justin Dunn. presents for   Back pain: Chronic low back pain. Treated with PT/outpatient rehab when discussed in August. Some agitation with PT- had to stop. Massage was helpful then.  R low back pain, with radiation of pain down R leg - thigh only, not crossing knee, past month.  No new bowel or bladder incontinence (some leakage prior form prostate cancer treatment - seed implant 2015, had surgery for urinary difficulty), no saddle anesthesia, no lower extremity weakness.   Has noticed past 3-4 months more soreness,  No known injury. More sore after more prolonged standing.  Prior Mobic QD, off for months.  Gabapentin 900 BID - did not tolerate tid - sedation.  [facility] Pat[facility] about 2 years [facility]. Had steroid injection - no relief. Option of surgery.  Prior fusion L4-sacrum.  Saw Dr. Lynann Bologna as well in past. Would like to follow up with him. No night sweats/fever/weight loss.    MRI LS spine in 2016 (no recent imaging noted in [facility]).  IMPRESSION: Worsened adjacent segment degenerative disease at L3-4. Circumferential bulging of the disc with focal protrusion in the right foraminal to extra foraminal region. Facet and ligamentous hypertrophy. Spinal stenosis at this level could cause neural compression. The right L3 nerve root could be  compressed as it exits the foramen.  Good appearance in the fusion segment from L4 to the sacrum.  Blood sugar 144, with A1c 5.8   History Patient Active Problem List   Diagnosis Date Noted  . History of syncope 06/13/2020  . Vitamin B12 deficiency (non anemic) 05/04/2019  . Urinary urgency 05/04/2019  . Tobacco use disorder 05/04/2019  . Snoring 05/04/2019  . Reduced libido 05/04/2019  . Personal history of prostate cancer 05/04/2019  . Low testosterone in male 05/04/2019  . Acquired bladder diverticulum 05/04/2019  . Family history of malignant neoplasm of prostate 05/04/2019  . Excessive daytime sleepiness 05/04/2019  . Erectile dysfunction following interstitial seed therapy 05/04/2019  . Urge incontinence of urine 05/04/2019  . Degenerative disc disease, lumbar 06/01/2016  . Degenerative disc disease, cervical 06/01/2016  . Insomnia due to other mental disorder 06/01/2016  . Adjustment disorder with mixed anxiety and depressed mood 06/01/2016  . Essential (primary) hypertension 02/07/2015  . Malignant neoplasm of prostate ([place]) 01/09/2014   Past Medical History:  Diagnosis Date  . Anxiety   . At risk for sleep apnea    STOP-BANG= 4   SENT TO PCP 04-02-2014  . Chronic low back pain   . Depression   . ED (erectile dysfunction)   . History of colon polyps   . History of gastric ulcer    as child  . Hypertension   . Hypogonadism male   .  Incomplete bladder emptying   . Lumbar spine scoliosis   . Premature ejaculation   . Prostate cancer [facility])    Dr Tammi Klippel ( oncologist) and Dr Loel Lofty [facility])   . Retention of urine, unspecified   . Sleep apnea    states never told to wear CPAP  . Wears glasses    Past Surgical History:  Procedure Laterality Date  . APPENDECTOMY  age 63  . COLONOSCOPY W/ POLYPECTOMY  july 2015  . LUMBAR FUSION  04/ 2012   and rod  . PROSTATE BIOPSY    . RADIOACTIVE SEED IMPLANT N/A 04/06/2014   Procedure: RADIOACTIVE SEED  IMPLANT;  Surgeon: [name], MD;  Location: [facility];  Service: Urology;  Laterality: N/A;  dr portable    No Known Allergies Prior to Admission medications   Medication Sig Start Date End Date Taking? Authorizing Provider  amLODipine (NORVASC) 10 MG tablet Take 1 tablet (10 mg total) by mouth daily. 04/07/19  Yes Stallings, Zoe A, MD  anastrozole (ARIMIDEX) 1 MG tablet Take 1 mg by mouth daily.    Yes [provider]  b complex vitamins tablet Take 1 tablet by mouth daily.   Yes [provider]  BLACK CURRANT SEED OIL PO Take by mouth.   Yes [provider]  Calcium-Cholecalciferol (VITA-CALCIUM PO) Take by mouth.    Yes [provider]  Cholecalciferol (VITAMIN D3) 1000 units CAPS Take by mouth.    Yes [provider]  citalopram (CELEXA) 40 MG tablet Take 1 tablet (40 mg total) by mouth daily. 04/04/20  Yes Wendie Agreste, MD  enalapril (VASOTEC) 20 MG tablet Take 1 tablet (20 mg total) by mouth 2 (two) times daily. 03/21/20  Yes Wendie Agreste, MD  escitalopram (LEXAPRO) 10 MG tablet Take 10 mg by mouth daily.   Yes [provider]  gabapentin (NEURONTIN) 300 MG capsule Take 3 capsules (900 mg total) by mouth 3 (three) times daily. 10/27/19  Yes Maximiano Coss, NP  hydrochlorothiazide (MICROZIDE) 12.5 MG capsule Take 1 capsule (12.5 mg total) by mouth daily. 03/21/20  Yes Wendie Agreste, MD  meloxicam (MOBIC) 15 MG tablet Take 1 tablet (15 mg total) by mouth daily as needed for pain. 08/14/19  Yes Rutherford Guys, MD  Multiple Vitamins-Minerals (ZINC PO) Take by mouth.   Yes [provider]  Multiple Vitamins-Minerals-FA (VITATAB MV PO) Take by mouth.   Yes [provider]  Olopatadine HCl (PATADAY) 0.2 % SOLN Apply 1 drop to eye daily. 11/27/19  Yes Stallings, Zoe A, MD  peppermint oil liquid by Does not apply route as needed.    Yes [provider]  traZODone (DESYREL) 100 MG tablet  Take 1 tablet (100 mg total) by mouth at bedtime. 04/04/20  Yes Wendie Agreste, MD  Turmeric 500 MG CAPS Take 1 tablet by mouth. Reported on 08/19/2015   Yes [provider]  zolpidem (AMBIEN) 10 MG tablet Take 1 tablet (10 mg total) by mouth at bedtime as needed for sleep. 04/04/20  Yes Wendie Agreste, MD   Social History   Socioeconomic History  . Marital status: Divorced    Spouse name: Not on file  . Number of children: Not on file  . Years of education: Not on file  . Highest education level: Some college, no degree  Occupational History  . Occupation: disability    Comment: DDD lumbar spine; 2013  Tobacco Use  . Smoking  status: Current Some Day Smoker    Packs/day: 1.00    Years: 10.00    Pack years: 10.00    Types: Cigarettes, Cigars  . Smokeless tobacco: Never Used  . Tobacco comment: currently smokes 2 small cigar per day/  quit cigarettes 2011  Vaping Use  . Vaping Use: Never used  Substance and Sexual Activity  . Alcohol use: Yes    Alcohol/week: 0.0 standard drinks    Comment: 1-2 glasses of wine daily  . Drug use: Yes    Types: Marijuana  . Sexual activity: Never    Birth control/protection: Abstinence  Other Topics Concern  . Not on file  Social History Narrative   Marital status: divorced; dating in 2019      Children: one foster son (autistic_      Lives: with foster son      Employment: disability from DDD lumbar      Tobacco: none      Alcohol: none      Drugs:      Exercise: minimal in 2019.      ADLs; drives; independent with ADLs.      Advanced Directives: none; FULL CODE.  Johnsie Cancel [phone] [facility] [phone].   Social Determinants of Health   Financial Resource Strain: Not on file  Food Insecurity: Not on file  Transportation Needs: Not on file  Physical Activity: Not on file  Stress: Not on file  Social Connections: Not on file  Intimate Partner Violence: Not on file    Review of Systems Per HPi.    Objective:   Vitals:   08/08/20 1030  BP: 128/83  Pulse: 82  Temp: (!) 97.4 F (36.3 C)  TempSrc: Temporal  SpO2: 97%  Weight: 226 lb (102.5 kg)  Height: 5\' 11"  (1.803 m)     Physical Exam Vitals reviewed.  Constitutional:      General: He is not in acute distress.    Appearance: He is well-developed and well-nourished.  HENT:     Head: Normocephalic and atraumatic.  Cardiovascular:     Rate and Rhythm: Normal rate.  Pulmonary:     Effort: Pulmonary effort is normal.  Musculoskeletal:     Comments: Lower lungLumbar spine, no midline bony tenderness.  Slight tenderness over the right paraspinals.  Sciatic notch nontender.  Negative seated straight leg raise, reflexes 2+ at patella, Achilles, Babinski negative.  Able to heel and toe walk without difficulty.  Neurological:     Mental Status: He is alert and oriented to person, place, and time.  Psychiatric:        Mood and Affect: Mood and affect normal.    30 minutes spent during visit, greater than 50% counseling and assimilation of information, chart review, and discussion of plan.   Results for orders placed or performed in visit on 08/08/20  POCT glucose (manual entry)  Result Value Ref Range   POC Glucose 103 (A) 70 - 99 mg/dl      Assessment & Plan:  Cord Wilczynski. is a 59 y.o. male . Chronic right-sided low back pain with right-sided sciatica - Plan: Ambulatory referral to Spine Surgery, DG Lumbar Spine Complete, predniSONE (DELTASONE) 20 MG tablet  Hyperglycemia - Plan: POCT glucose (manual entry), POCT glycosylated hemoglobin (Hb A1C)  Chronic low back pain with worsening symptoms including worsening sciatica symptoms of right thigh past month.  Reassuring exam.  Will check updated imaging with x-ray, continue gabapentin, trial of prednisone if glucose okay as previous  hyperglycemia/prediabetes.  Refer to back specialist, he would like to follow-up back with Dr. Lynann Bologna.  Referral placed.  ER/RTC  precautions given.  Meds ordered this encounter  Medications  . predniSONE (DELTASONE) 20 MG tablet    Sig: Take 2 tablets (40 mg total) by mouth daily with breakfast.    Dispense:  10 tablet    Refill:  0   Patient Instructions    I will refer you to Dr. Lynann Bologna, have back xray today- [address]. Continue gabapentin for now,  If blood sugar is ok, can try prednisone. Return to the clinic or go to the nearest emergency room if any of your symptoms worsen or new symptoms occur.   Chronic Back Pain When back pain lasts longer than 3 months, it is called chronic back pain.The cause of your back pain may not be known. Some common causes include:  Wear and tear (degenerative disease) of the bones, ligaments, or disks in your back.  Inflammation and stiffness in your back (arthritis). People who have chronic back pain often go through certain periods in which the pain is more intense (flare-ups). Many people can learn to manage the pain with home care. Follow these instructions at home: Pay attention to any changes in your symptoms. Take these actions to help with your pain: Activity   Avoid bending and other activities that make the problem worse.  Maintain a proper position when standing or sitting: ? When standing, keep your upper back and neck straight, with your shoulders pulled back. Avoid slouching. ? When sitting, keep your back straight and relax your shoulders. Do not round your shoulders or pull them backward.  Do not sit or stand in one place for long periods of time.  Take brief periods of rest throughout the day. This will reduce your pain. Resting in a lying or standing position is usually better than sitting to rest.  When you are resting for longer periods, mix in some mild activity or stretching between periods of rest. This will help to prevent stiffness and pain.  Get regular exercise. Ask your health care provider what activities are safe for  you.  Do not lift anything that is heavier than 10 lb (4.5 kg). Always use proper lifting technique, which includes: ? Bending your knees. ? Keeping the load close to your body. ? Avoiding twisting.  Sleep on a firm mattress in a comfortable position. Try lying on your side with your knees slightly bent. If you lie on your back, put a pillow under your knees. Managing pain  If directed, apply ice to the painful area. Your health care provider may recommend applying ice during the first 24-48 hours after a flare-up begins. ? Put ice in a plastic bag. ? Place a towel between your skin and the bag. ? Leave the ice on for 20 minutes, 2-3 times per day.  If directed, apply heat to the affected area as often as told by your health care provider. Use the heat source that your health care provider recommends, such as a moist heat pack or a heating pad. ? Place a towel between your skin and the heat source. ? Leave the heat on for 20-30 minutes. ? Remove the heat if your skin turns bright red. This is especially important if you are unable to feel pain, heat, or cold. You may have a greater risk of getting burned.  Try soaking in a warm tub.  Take over-the-counter and prescription medicines only as  told by your health care provider.  Keep all follow-up visits as told by your health care provider. This is important. Contact a health care provider if:  You have pain that is not relieved with rest or medicine. Get help right away if:  You have weakness or numbness in one or both of your legs or feet.  You have trouble controlling your bladder or your bowels.  You have nausea or vomiting.  You have pain in your abdomen.  You have shortness of breath or you faint. This information is not intended to replace advice given to you by your health care provider. Make sure you discuss any questions you have with your health care provider. Document Revised: 12/08/2018 Document Reviewed:  02/24/2017 Elsevier Patient Education  [employer].   If you have lab work done today you will be contacted with your lab results within the next 2 weeks.  If you have not heard from [place] then please contact us. The fastest way to get your results is to register for My Chart.   IF you received an x-ray today, you will receive an invoice from [facility]. Please contact [facility] at [phone] with questions or concerns regarding your invoice.   IF you received labwork today, you will receive an invoice from [facility]. Please contact [facility] at [phone] with questions or concerns regarding your invoice.   Our billing staff will not be able to assist you with questions regarding bills from these companies.  You will be contacted with the lab results as soon as they are available. The fastest way to get your results is to activate your My Chart account. Instructions are located on the last page of this paperwork. If you have not heard from [place] regarding the results in 2 weeks, please contact this office.         Signed, Merri Ray, MD [facility]

## 2020-09-05 ENCOUNTER — Ambulatory Visit: Payer: Medicare HMO | Admitting: Neurology

## 2020-09-05 ENCOUNTER — Encounter: Payer: Self-pay | Admitting: Neurology

## 2020-09-05 VITALS — BP 152/95 | HR 102 | Ht 71.0 in | Wt 221.0 lb

## 2020-09-05 DIAGNOSIS — F99 Mental disorder, not otherwise specified: Secondary | ICD-10-CM

## 2020-09-05 DIAGNOSIS — G4701 Insomnia due to medical condition: Secondary | ICD-10-CM

## 2020-09-05 DIAGNOSIS — R0683 Snoring: Secondary | ICD-10-CM

## 2020-09-05 DIAGNOSIS — G8929 Other chronic pain: Secondary | ICD-10-CM | POA: Diagnosis not present

## 2020-09-05 DIAGNOSIS — G4719 Other hypersomnia: Secondary | ICD-10-CM | POA: Diagnosis not present

## 2020-09-05 DIAGNOSIS — R351 Nocturia: Secondary | ICD-10-CM | POA: Diagnosis not present

## 2020-09-05 DIAGNOSIS — R69 Illness, unspecified: Secondary | ICD-10-CM | POA: Diagnosis not present

## 2020-09-05 DIAGNOSIS — I951 Orthostatic hypotension: Secondary | ICD-10-CM

## 2020-09-05 DIAGNOSIS — F5105 Insomnia due to other mental disorder: Secondary | ICD-10-CM

## 2020-09-05 MED ORDER — TRAZODONE HCL 100 MG PO TABS: 100.0000 mg | ORAL_TABLET | Freq: Every day | ORAL | 1 refills | Status: DC

## 2020-09-05 NOTE — Addendum Note (Signed)
Addended by: Melvyn Novas on: 09/05/2020 04:19 PM   Modules accepted: Orders

## 2020-09-05 NOTE — Patient Instructions (Signed)
Screening for Sleep Apnea  Sleep apnea is a condition in which breathing pauses or becomes shallow during sleep. Sleep apnea screening is a test to determine if you are at risk for sleep apnea. The test is easy and only takes a few minutes. Your health care provider may ask you to have this test in preparation for surgery or as part of a physical exam. What are the symptoms of sleep apnea? Common symptoms of sleep apnea include:  Snoring.  Restless sleep.  Daytime sleepiness.  Pauses in breathing.  Choking during sleep.  Irritability.  Forgetfulness.  Trouble thinking clearly.  Depression.  Personality changes. Most people with sleep apnea are not aware that they have it. Why should I get screened? Getting screened for sleep apnea can help:  Ensure your safety. It is important for your health care providers to know whether or not you have sleep apnea, especially if you are having surgery or have other long-term (chronic) health conditions.  Improve your health and allow you to get a better night's rest. Restful sleep can help you: ? Have more energy. ? Lose weight. ? Improve high blood pressure. ? Improve diabetes management. ? Prevent stroke. ? Prevent car accidents. How is screening done? Screening usually includes being asked a list of questions about your sleep quality. Some questions you may be asked include:  Do you snore?  Is your sleep restless?  Do you have daytime sleepiness?  Has a partner or spouse told you that you stop breathing during sleep?  Have you had trouble concentrating or memory loss? If your screening test is positive, you are at risk for the condition. Further testing may be needed to confirm a diagnosis of sleep apnea. Where to find more information You can find screening tools online or at your health care clinic. For more information about sleep apnea screening and healthy sleep, visit these websites:  Centers for Disease Control and  Prevention: www.cdc.gov/sleep/index.html  American Sleep Apnea Association: www.sleepapnea.org Contact a health care provider if:  You think that you may have sleep apnea. Summary  Sleep apnea screening can help determine if you are at risk for sleep apnea.  It is important for your health care providers to know whether or not you have sleep apnea, especially if you are having surgery or have other chronic health conditions.  You may be asked to take a screening test for sleep apnea in preparation for surgery or as part of a physical exam. This information is not intended to replace advice given to you by your health care provider. Make sure you discuss any questions you have with your health care provider. Document Revised: 06/03/2018 Document Reviewed: 11/27/2016 Elsevier Patient Education  2020 [employer] Quality Sleep Information, Adult Quality sleep is important for your mental and physical health. It also improves your quality of life. Quality sleep means you:  Are asleep for most of the time you are in bed.  Fall asleep within 30 minutes.  Wake up no more than once a night.  Are awake for no longer than 20 minutes if you do wake up during the night. Most adults need 7-8 hours of quality sleep each night. How can poor sleep affect me? If you do not get enough quality sleep, you may have:  Mood swings.  Daytime sleepiness.  Confusion.  Decreased reaction time.  Sleep disorders, such as insomnia and sleep apnea.  Difficulty with: ? Solving problems. ? Coping with stress. ? Paying attention. These issues may   affect your performance and productivity at work, school, and at home. Lack of sleep may also put you at higher risk for accidents, suicide, and risky behaviors. If you do not get quality sleep you may also be at higher risk for several health problems, including:  Infections.  Type 2 diabetes.  Heart disease.  High blood  pressure.  Obesity.  Worsening of long-term conditions, like arthritis, kidney disease, depression, Parkinson's disease, and epilepsy. What actions can I take to get more quality sleep?      Stick to a sleep schedule. Go to sleep and wake up at about the same time each day. Do not try to sleep less on weekdays and make up for lost sleep on weekends. This does not work.  Try to get about 30 minutes of exercise on most days. Do not exercise 2-3 hours before going to bed.  Limit naps during the day to 30 minutes or less.  Do not use any products that contain nicotine or tobacco, such as cigarettes or e-cigarettes. If you need help quitting, ask your health care provider.  Do not drink caffeinated beverages for at least 8 hours before going to bed. Coffee, tea, and some sodas contain caffeine.  Do not drink alcohol close to bedtime.  Do not eat large meals close to bedtime.  Do not take naps in the late afternoon.  Try to get at least 30 minutes of sunlight every day. Morning sunlight is best.  Make time to relax before bed. Reading, listening to music, or taking a hot bath promotes quality sleep.  Make your bedroom a place that promotes quality sleep. Keep your bedroom dark, quiet, and at a comfortable room temperature. Make sure your bed is comfortable. Take out sleep distractions like TV, a computer, smartphone, and bright lights.  If you are lying awake in bed for longer than 20 minutes, get up and do a relaxing activity until you feel sleepy.  Work with your health care provider to treat medical conditions that may affect sleeping, such as: ? Nasal obstruction. ? Snoring. ? Sleep apnea and other sleep disorders.  Talk to your health care provider if you think any of your prescription medicines may cause you to have difficulty falling or staying asleep.  If you have sleep problems, talk with a sleep consultant. If you think you have a sleep disorder, talk with your health  care provider about getting evaluated by a specialist. Where to find more information  National Sleep Foundation website: https://sleepfoundation.org  National Heart, Lung, and Blood Institute (NHLBI): www.nhlbi.nih.gov/files/docs/public/sleep/healthy_sleep.pdf  Centers for Disease Control and Prevention (CDC): www.cdc.gov/sleep/index.html Contact a health care provider if you:  Have trouble getting to sleep or staying asleep.  Often wake up very early in the morning and cannot get back to sleep.  Have daytime sleepiness.  Have daytime sleep attacks of suddenly falling asleep and sudden muscle weakness (narcolepsy).  Have a tingling sensation in your legs with a strong urge to move your legs (restless legs syndrome).  Stop breathing briefly during sleep (sleep apnea).  Think you have a sleep disorder or are taking a medicine that is affecting your quality of sleep. Summary  Most adults need 7-8 hours of quality sleep each night.  Getting enough quality sleep is an important part of health and well-being.  Make your bedroom a place that promotes quality sleep and avoid things that may cause you to have poor sleep, such as alcohol, caffeine, smoking, and large meals.  Talk to   your health care provider if you have trouble falling asleep or staying asleep. This information is not intended to replace advice given to you by your health care provider. Make sure you discuss any questions you have with your health care provider. Document Revised: 11/24/2017 Document Reviewed: 11/24/2017 Elsevier Patient Education  2020 [employer] Insomnia Insomnia is a sleep disorder that makes it difficult to fall asleep or stay asleep. Insomnia can cause fatigue, low energy, difficulty concentrating, mood swings, and poor performance at work or school. There are three different ways to classify insomnia:  Difficulty falling asleep.  Difficulty staying asleep.  Waking up too early in the  morning. Any type of insomnia can be long-term (chronic) or short-term (acute). Both are common. Short-term insomnia usually lasts for three months or less. Chronic insomnia occurs at least three times a week for longer than three months. What are the causes? Insomnia may be caused by another condition, situation, or substance, such as:  Anxiety.  Certain medicines.  Gastroesophageal reflux disease (GERD) or other gastrointestinal conditions.  Asthma or other breathing conditions.  Restless legs syndrome, sleep apnea, or other sleep disorders.  Chronic pain.  Menopause.  Stroke.  Abuse of alcohol, tobacco, or illegal drugs.  Mental health conditions, such as depression.  Caffeine.  Neurological disorders, such as Alzheimer's disease.  An overactive thyroid (hyperthyroidism). Sometimes, the cause of insomnia may not be known. What increases the risk? Risk factors for insomnia include:  Gender. Women are affected more often than men.  Age. Insomnia is more common as you get older.  Stress.  Lack of exercise.  Irregular work schedule or working night shifts.  Traveling between different time zones.  Certain medical and mental health conditions. What are the signs or symptoms? If you have insomnia, the main symptom is having trouble falling asleep or having trouble staying asleep. This may lead to other symptoms, such as:  Feeling fatigued or having low energy.  Feeling nervous about going to sleep.  Not feeling rested in the morning.  Having trouble concentrating.  Feeling irritable, anxious, or depressed. How is this diagnosed? This condition may be diagnosed based on:  Your symptoms and medical history. Your health care provider may ask about: ? Your sleep habits. ? Any medical conditions you have. ? Your mental health.  A physical exam. How is this treated? Treatment for insomnia depends on the cause. Treatment may focus on treating an underlying  condition that is causing insomnia. Treatment may also include:  Medicines to help you sleep.  Counseling or therapy.  Lifestyle adjustments to help you sleep better. Follow these instructions at home: Eating and drinking   Limit or avoid alcohol, caffeinated beverages, and cigarettes, especially close to bedtime. These can disrupt your sleep.  Do not eat a large meal or eat spicy foods right before bedtime. This can lead to digestive discomfort that can make it hard for you to sleep. Sleep habits   Keep a sleep diary to help you and your health care provider figure out what could be causing your insomnia. Write down: ? When you sleep. ? When you wake up during the night. ? How well you sleep. ? How rested you feel the next day. ? Any side effects of medicines you are taking. ? What you eat and drink.  Make your bedroom a dark, comfortable place where it is easy to fall asleep. ? Put up shades or blackout curtains to block light from outside. ? Use a white noise  machine to block noise. ? Keep the temperature cool.  Limit screen use before bedtime. This includes: ? Watching TV. ? Using your smartphone, tablet, or computer.  Stick to a routine that includes going to bed and waking up at the same times every day and night. This can help you fall asleep faster. Consider making a quiet activity, such as reading, part of your nighttime routine.  Try to avoid taking naps during the day so that you sleep better at night.  Get out of bed if you are still awake after 15 minutes of trying to sleep. Keep the lights down, but try reading or doing a quiet activity. When you feel sleepy, go back to bed. General instructions  Take over-the-counter and prescription medicines only as told by your health care provider.  Exercise regularly, as told by your health care provider. Avoid exercise starting several hours before bedtime.  Use relaxation techniques to manage stress. Ask your health  care provider to suggest some techniques that may work well for you. These may include: ? Breathing exercises. ? Routines to release muscle tension. ? Visualizing peaceful scenes.  Make sure that you drive carefully. Avoid driving if you feel very sleepy.  Keep all follow-up visits as told by your health care provider. This is important. Contact a health care provider if:  You are tired throughout the day.  You have trouble in your daily routine due to sleepiness.  You continue to have sleep problems, or your sleep problems get worse. Get help right away if:  You have serious thoughts about hurting yourself or someone else. If you ever feel like you may hurt yourself or others, or have thoughts about taking your own life, get help right away. You can go to your nearest emergency department or call:  Your local emergency services (911 in the [place]  A suicide crisis helpline, such as the National Suicide Prevention Lifeline at (951)224-3115. This is open 24 hours a day. Summary  Insomnia is a sleep disorder that makes it difficult to fall asleep or stay asleep.  Insomnia can be long-term (chronic) or short-term (acute).  Treatment for insomnia depends on the cause. Treatment may focus on treating an underlying condition that is causing insomnia.  Keep a sleep diary to help you and your health care provider figure out what could be causing your insomnia. This information is not intended to replace advice given to you by your health care provider. Make sure you discuss any questions you have with your health care provider. Document Revised: 07/30/2017 Document Reviewed: 05/27/2017 Elsevier Patient Education  2020 [employer].

## 2020-09-05 NOTE — Progress Notes (Signed)
SLEEP MEDICINE CLINIC    Provider:  Larey Seat, MD  Primary Care Physician:  Wendie Agreste, [place] [id number]     Referring Provider: dr. Carlota Raspberry, [place].         Chief Complaint according to patient   Patient presents with:    . New Patient (Initial Visit)           HISTORY OF PRESENT ILLNESS:  Justin Dunn. is a 60 y.o. African- American male patient  who [place] on the [place] and who is seen here upon a consultation requested by Dr. Carlota Raspberry on 09/05/2020  for a work up of unexplained syncope. He is questioning if he has OSA.   Chief concern according to patient : "  I don't sleep well, and I am always tired. My doc wants to check out a sleep disorder due to cardiac work up being negative, seizure ruled out. " Justin Dunn confirmed the excessive sleepiness he feels by endorsing the Epworth Sleepiness Scale at 17 out of 24 points, the fatigue severity scale at 46 out of 63 points also very elevated and the geriatric depression scale at 10 out of 15 points which indicates at least the presence of her clinical depression.  He has suffered from insomnia poor sleep quality.  He has a lot of back pain and has been treated at [facility] and evaluated.  Formerly took prednisone to reduce the back pain. The patient also has a history of prostate cancer and surgery he has been followed by Dr. Tresa Endo.  He reports that on March 19, 2020 he went to a [employer] he was prepping food he then went to see his daughter.  Sitting on the balcony the daughter noticed that his hands were shaking and he reported that he was in quite agonizing pain in his back.  He was wearing a brace that day.  He took the brace off stood up felt lightheaded and then fell for a few seconds he seemed to have been out he did have urinary incontinence.   There was no further shaking there was no tonic-clonic activities and he did not bite his tongue.  As soon as he  came back to he was fully oriented.  Possibly he was dehydrated at the time at this sounds more like an orthostatic event.  He did not report any chest pain and he did not go to the ER.  His gabapentin has been increased to 900 mg 3 times daily Mobic 15 mg daily for back pain and has been followed at [facility], where he also received trazodone for sleep.  He had surgery performed by Dr. Bennie Pierini: At spinal scoliosis center, had seen [facility] only due to emergency, and he will now follow with Dr. Jeronimo Norma at [place].     INoel Delmont Dunn. is a left-handed Black  male with a history of Anxiety, At risk for sleep apnea, Chronic low back pain, Depression, ED (erectile dysfunction), History of colon polyps, History of gastric ulcer, Hypertension, Hypogonadism male, Incomplete bladder emptying, Lumbar spine scoliosis, Premature ejaculation, Prostate cancer dx in 2014 [facility]), Retention of urine, unspecified, and Wears glasses.    Family medical /sleep history: no  other family member on CPAP with OSA, father had prostrate cancer and died of C diff - had a pacemaker. Family history of HTN,  3 siblings snore. 1 sibling with chronic back pain.  Spinal  stenosis.      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 He retired from that and lives in a household alone. Family status is divorced, with adult foster children.  Tobacco use:- occasionally 2 a week- .ETOH use ; 2 wine a week.  Caffeine intake in form of Coffee(/) Soda( /) Tea ( /) no  energy drinks. Regular exercise in form of swimming.      Sleep habits are as follows: The patient's dinner time is between 6-7 PM. The patient goes to bed at 9 PM and continues to sleep for increments of 2-4 hours, wakes from pain and for bathroom breaks.   The preferred sleep position is sideways, with the support of 4 pillows. Dreams  are reportedly infrequent. 5-7 AM is the usual rise time. The patient wakes up spontaneously.  He reports not feeling refreshed or restored in AM, with symptoms such as dry mouth, stiffness, and lightheadednes,  morning headaches and residual fatigue.   Naps are taken infrequently, naps are may be taken 2/moth- and not scheduled. Naps asting from 1-2 hours and are more.  Review of Systems: Out of a complete 14 system review, the patient complains of only the following symptoms, and all other reviewed systems are negative.:  Fatigue, sleepiness , known he is snoring, fragmented sleep,  Gabapentin related sleepiness, pain related Insomnia . Suspected ot have apnea.    How likely are you to doze in the following situations: 0 = not likely, 1 = slight chance, 2 = moderate chance, 3 = high chance   Sitting and Reading? Watching Television? Sitting inactive in a public place (theater or meeting)? As a passenger in a car for an hour without a break? Lying down in the afternoon when circumstances permit? Sitting and talking to someone? Sitting quietly after lunch without alcohol? In a car, while stopped for a few minutes in traffic?   dyspnea on exertion. Snoring loudly.     Total = 17/ 24 points   FSS endorsed at 47/ 63 points.   Social History   Socioeconomic History  . Marital status: Divorced    Spouse name: Not on file  . Number of children: Not on file  . Years of education: Not on file  . Highest education level: Some college, no degree  Occupational History  . Occupation: disability    Comment: DDD lumbar spine; 2013  Tobacco Use  . Smoking status: Current Some Day Smoker    Packs/day: 1.00    Years: 10.00    Pack years: 10.00    Types: Cigarettes, Cigars  . Smokeless tobacco: Never Used  . Tobacco comment: currently smokes 2 small cigar per day/  quit cigarettes 2011  Vaping Use  . Vaping Use: Never used  Substance and Sexual Activity  . Alcohol use: Yes     Alcohol/week: 0.0 standard drinks    Comment: 1-2 glasses of wine daily  . Drug use: Yes    Types: Marijuana  . Sexual activity: Never    Birth control/protection: Abstinence  Other Topics Concern  . Not on file  Social History Narrative   Marital status: divorced; dating in 2019      Children: one foster son (autistic_      Lives: with foster son      Employment: disability from DDD lumbar      Tobacco: none  Alcohol: none      Drugs:      Exercise: minimal in 2019.      ADLs; drives; independent with ADLs.      Advanced Directives: none; FULL CODE.  Justin Dunn Owens [place]offler [phone].   Social Determinants of Health   Financial Resource Strain: Not on file  Food Insecurity: Not on file  Transportation Needs: Not on file  Physical Activity: Not on file  Stress: Not on file  Social Connections: Not on file    Family History  Problem Relation Age of Onset  . Prostate cancer Father   . Hypertension Mother   . Pulmonary fibrosis Mother   . Arthritis Mother   . Diabetes Mother   . Deep vein thrombosis Sister     Past Medical History:  Diagnosis Date  . Anxiety   . At risk for sleep apnea    STOP-BANG= 4   SENT TO PCP 04-02-2014  . Chronic low back pain   . Depression   . ED (erectile dysfunction)   . History of colon polyps   . History of gastric ulcer    as child  . Hypertension   . Hypogonadism male   . Incomplete bladder emptying   . Lumbar spine scoliosis   . Premature ejaculation   . Prostate cancer [facility])    Dr Kathrynn Running ( oncologist) and Dr Wynelle Link [facility])   . Retention of urine, unspecified   . Sleep apnea    states never told to wear CPAP  . Wears glasses     Past Surgical History:  Procedure Laterality Date  . APPENDECTOMY  age 49  . COLONOSCOPY W/ POLYPECTOMY  july 2015  . LUMBAR FUSION  04/ 2012   and rod  . PROSTATE BIOPSY    . RADIOACTIVE SEED IMPLANT N/A 04/06/2014   Procedure: RADIOACTIVE SEED  IMPLANT;  Surgeon: Garnett Farm, MD;  Location: [facility];  Service: Urology;  Laterality: N/A;  dr portable      Current Outpatient Medications on File Prior to Visit  Medication Sig Dispense Refill  . amLODipine (NORVASC) 10 MG tablet Take 1 tablet (10 mg total) by mouth daily. 30 tablet 0  . anastrozole (ARIMIDEX) 1 MG tablet Take 1 mg by mouth daily.     Marland Kitchen b complex vitamins tablet Take 1 tablet by mouth daily.    Marland Kitchen BLACK CURRANT SEED OIL PO Take by mouth.    . Calcium-Cholecalciferol (VITA-CALCIUM PO) Take by mouth.     . Cholecalciferol (VITAMIN D3) 1000 units CAPS Take by mouth.     . citalopram (CELEXA) 40 MG tablet Take 1 tablet (40 mg total) by mouth daily. 90 tablet 1  . enalapril (VASOTEC) 20 MG tablet Take 1 tablet (20 mg total) by mouth 2 (two) times daily. 180 tablet 1  . escitalopram (LEXAPRO) 10 MG tablet Take 10 mg by mouth daily.    Marland Kitchen gabapentin (NEURONTIN) 300 MG capsule Take 3 capsules (900 mg total) by mouth 3 (three) times daily. 270 capsule 5  . hydrochlorothiazide (MICROZIDE) 12.5 MG capsule Take 1 capsule (12.5 mg total) by mouth daily. 30 capsule 1  . meloxicam (MOBIC) 15 MG tablet Take 1 tablet (15 mg total) by mouth daily as needed for pain. 90 tablet 1  . Multiple Vitamins-Minerals (ZINC PO) Take by mouth.    . Multiple Vitamins-Minerals-FA (VITATAB MV PO) Take by mouth.    . Olopatadine HCl (PATADAY) 0.2 % SOLN Apply 1  drop to eye daily. 2.5 mL 3  . peppermint oil liquid by Does not apply route as needed.     . predniSONE (DELTASONE) 20 MG tablet Take 2 tablets (40 mg total) by mouth daily with breakfast. 10 tablet 0  . traZODone (DESYREL) 100 MG tablet Take 1 tablet (100 mg total) by mouth at bedtime. 90 tablet 1  . Turmeric 500 MG CAPS Take 1 tablet by mouth. Reported on 08/19/2015    . zolpidem (AMBIEN) 10 MG tablet Take 1 tablet (10 mg total) by mouth at bedtime as needed for sleep. 30 tablet 0   No current facility-administered  medications on file prior to visit.    No Known Allergies  Physical exam:  Today's Vitals   09/05/20 1515  BP: (!) 152/95  Pulse: (!) 102  Weight: 221 lb (100.2 kg)  Height: 5\' 11"  (1.803 m)   Body mass index is 30.82 kg/m.   Wt Readings from Last 3 Encounters:  09/05/20 221 lb (100.2 kg)  08/08/20 226 lb (102.5 kg)  08/06/20 225 lb 12.8 oz (102.4 kg)     Ht Readings from Last 3 Encounters:  09/05/20 5\' 11"  (1.803 m)  08/08/20 5\' 11"  (1.803 m)  08/06/20 5\' 11"  (1.803 m)      General: The patient is awake, alert and appears not in acute distress. The patient is well groomed. Head: Normocephalic, atraumatic. Neck is supple. Mallampati 2-3, very laterally crowded. ,  neck circumference: 19 inches . Nasal airflow patent.  Retrognathia is not seen.  Dental status: native  Cardiovascular:  Regular rate and cardiac rhythm by pulse,  without distended neck veins. Respiratory: Lungs are clear to auscultation.  Skin:  Without evidence of ankle edema, or rash. Trunk: The patient's posture is erect.   Neurologic exam : The patient is awake and alert, oriented to place and time.   Memory subjective described as intact.  Attention span & concentration ability appears normal.  Speech is fluent,  without  dysarthria, dysphonia or aphasia.  Mood and affect are appropriate.   Cranial nerves: no loss of smell or taste reported  Pupils are equal and briskly reactive to light. Funduscopic exam deferred. .  Extraocular movements in vertical and horizontal planes were intact and without nystagmus. No Diplopia. Visual fields by finger perimetry are intact. Hearing was intact to soft voice and finger rubbing.    Facial sensation intact to fine touch. Night blind. Early cataract.   Facial motor strength is symmetric and tongue and uvula move midline.  Neck ROM : rotation, tilt and flexion extension were normal for age and shoulder shrug was symmetrical.    Motor exam:  Symmetric bulk,  tone and ROM.   Normal tone without cog- wheeling,  symmetric grip strength .   Sensory:  Fine touch  and vibration were normal.  Radiating numbness, tingling, electric curent feeling down the left arm from the neck.  Right leg L5.  Proprioception tested in the upper extremities was normal.  Coordination: Rapid alternating movements in the fingers/hands were of normal speed.  The Finger-to-nose maneuver was intact without evidence of ataxia, dysmetria or tremor. Gait and station: Patient could rise unassisted from a seated position, walked without assistive device.  Stance is of normal width/ base and the patient turned with 3 steps.  Toe and heel walk were deferred.  Deep tendon reflexes: in the  upper and lower extremities are symmetric and intact.  Babinski response was deferred.  After spending a total time of  45 minutes face to face and additional time for physical and neurologic examination, review of laboratory studies,  personal review of imaging studies, reports and results of other testing and review of referral information / records as far as provided in visit. The patient if fully vaccinated for Covid 19- third dose was 06-15-2020.   I have established the following assessments:  1)  Excessive daytime sleepiness , excessive fatigue, and high clinical depression score  2)  Chronic insomnia and restricted sleep time due to chronic pain and nocturia.  3)  Chronic pain.  4) His LOC spell was very unlikely a seizure, no post-iktum and no convulsion. orthostatic syncope.    My Plan is to proceed with:  1)  PSG or HST for screening of apnea. OSA is highly likely present.  2)  encourage hydration.  3) sleep hygiene - set a bedtime and rise , 8 hours , hot before Bedtime, electronics out of the bedroom.   I would like to thank Wendie Agreste, MD and Forrest Moron, Md [phone] [place],  [place] [address] for allowing me to meet with and to  take care of this pleasant patient.   In short, Kavian Yorke.  will  follow up either personally or through our NP within 3 month.   CC: I will share my notes with PCP.   Electronically signed by: Larey Seat, MD 09/05/2020 3:38 PM  [facility] and [facility] Sleep Board certified by The [employer] of Sleep Medicine and Diplomate of the [employer] of Sleep Medicine. Board certified In Neurology through the [place], Fellow of the [employer] of Neurology. Medical Director of [employer].

## 2020-09-06 DIAGNOSIS — M545 Low back pain, unspecified: Secondary | ICD-10-CM | POA: Diagnosis not present

## 2020-09-23 ENCOUNTER — Ambulatory Visit (INDEPENDENT_AMBULATORY_CARE_PROVIDER_SITE_OTHER): Payer: Medicare HMO | Admitting: Neurology

## 2020-09-23 DIAGNOSIS — G4730 Sleep apnea, unspecified: Secondary | ICD-10-CM

## 2020-09-23 DIAGNOSIS — G8929 Other chronic pain: Secondary | ICD-10-CM

## 2020-09-23 DIAGNOSIS — R351 Nocturia: Secondary | ICD-10-CM

## 2020-09-23 DIAGNOSIS — G4701 Insomnia due to medical condition: Secondary | ICD-10-CM

## 2020-09-23 DIAGNOSIS — G4733 Obstructive sleep apnea (adult) (pediatric): Secondary | ICD-10-CM | POA: Diagnosis not present

## 2020-09-23 DIAGNOSIS — R0683 Snoring: Secondary | ICD-10-CM

## 2020-09-23 DIAGNOSIS — G4719 Other hypersomnia: Secondary | ICD-10-CM

## 2020-09-24 NOTE — Progress Notes (Signed)
   [facility] (Watch PAT)  STUDY DATE: 09/24/20  DOB: [birth year]  MRN: [id number]  ORDERING CLINICIAN: Larey Seat, MD   REFERRING CLINICIAN: Wendie Agreste, MD   CLINICAL INFORMATION/HISTORY: Justin Dunnis a 60 y.o. left handed African- American [place] . He was seen for a sleep consultation requested by Dr. Carlota Dunn on 09/05/2020 for a work up of unexplained syncope. He is questioning if he has OSA.   Chiefconcernaccording to patient : " I don't sleep well, and I am always tired. My doc wants to check out a sleep disorder due to cardiac work up being negative, seizure ruled out. " Mr. Justin Dunn. confirmed the excessive sleepines he feels by endorsing the Epworth Sleepiness Scale at 17 /24 points, the fatigue severity scale at 46 / 63 points and the geriatric depression scale at 10/15 points which indicates the presence of clinical depression.  He has suffered from insomnia,and reports poor sleep quality.  He has a lot of back pain -treated at [facility].  Formerly took prednisone to reduce the back pain. The patient also has a history of prostate cancer and surgery he has been followed by Dr. Tresa Dunn.  He reports that on March 19, 2020 he went to a [employer] where he was prepping food he then went to see his daughter.  Sitting on the balcony the daughter noticed that his hands were shaking and he reported that he had quite agonizing pain in his back.  He was wearing a brace that day. He took the brace off, stood up, felt lightheaded and then fell - and for a few seconds he seemed to have been loosing awareness, he did have urinary incontinence.    Epworth sleepiness score: 17/24. FSS at 10/15 points.  BMI: 30.9 kg/m Neck Circumference: 19 "  FINDINGS:   Total Record Time (hours, min): 8 h 42 min Total Sleep Time (hours, min):  7 h 18 min  Percent REM (%):    16.41 %   Calculated pAHI (per hour):  62.4       REM pAHI: 41.0   NREM pAHI: 66.7 Supine AHI: 67.7 Oxygen Saturation (%) Mean: 96  Minimum oxygen saturation (%):        80   O2 Saturation Range (%): 80-100  O2Saturation (minutes) <=88%: 0.2 min  Pulse Mean (bpm):    76  Pulse Range (56-107)   IMPRESSION: The HST confirms the presence of severe OSA (obstructive sleep apnea) with an AHI of 62.4/h not much accentuated by position or REM sleep stage.  No prolonged hypoxemia was noted. Normal pulse range, but we can't see cardiac rhythm in HST. About 14 % of apneas were deemed central in origin, making this a complex apnea form.    RECOMMENDATION: This severe degree of sleep apnea needs treatment ASAP. I recommend an autotitration CPAP with a mask to be fitted according to patients comfort. The settings will be 5-18 cm water, 2 cm EPR with heated humidification.   INTERPRETING PHYSICIAN:  Justin Seat, MD  [facility] and [facility] Sleep Board certified by The [employer] of Sleep Medicine and Diplomate of the [employer] of Sleep Medicine. Board certified In Neurology through the [place], Fellow of the [employer] of Neurology. Medical Director of [employer].

## 2020-10-01 DIAGNOSIS — G4730 Sleep apnea, unspecified: Secondary | ICD-10-CM | POA: Insufficient documentation

## 2020-10-01 NOTE — Procedures (Signed)
[facility] (Watch PAT)  STUDY DATE: 09/24/20  DOB: [birth year]  MRN: [id number]  ORDERING CLINICIAN: Larey Seat, MD   REFERRING CLINICIAN: Wendie Agreste, MD   CLINICAL INFORMATION/HISTORY: FREDDERICK SWANGER Jr.is a 60 y.o. left handed African- American male patientwho [place] . He was seen for a sleep consultation requested by Dr. Carlota Raspberry on 09/05/2020 for a work up of unexplained syncope. He is questioning if he has OSA.   Chiefconcernaccording to patient : " I don't sleep well, and I am always tired. My doc wants to check out a sleep disorder due to cardiac work up being negative, seizure ruled out. " Justin Dunn. confirmed the excessive sleepines he feels by endorsing the Epworth Sleepiness Scale at 17 /24 points, the fatigue severity scale at 46 / 63 points and the geriatric depression scale at 10/15 points which indicates the presence of clinical depression.  He has suffered from insomnia,and reports poor sleep quality.  He has a lot of back pain -treated at [facility].  Formerly took prednisone to reduce the back pain. The patient also has a history of prostate cancer and surgery he has been followed by Dr. Tresa Endo.  He reports that on March 19, 2020 he went to a [employer] where he was prepping food he then went to see his daughter.  Sitting on the balcony the daughter noticed that his hands were shaking and he reported that he had quite agonizing pain in his back.  He was wearing a brace that day. He took the brace off, stood up, felt lightheaded and then fell - and for a few seconds he seemed to have been loosing awareness, he did have urinary incontinence.    Epworth sleepiness score: 17/24. FSS at 10/15 points. [id number] BMI: 30.9 kg/m Neck Circumference: 19 "  FINDINGS:   Total Record Time (hours, min): 8 h 42 min Total Sleep Time (hours, min):  7 h 18 min  Percent REM (%):    16.41 %   Calculated pAHI (per hour):  62.4       REM pAHI: 41.0   NREM pAHI: 66.7 Supine AHI: 67.7 Oxygen Saturation (%) Mean: 96  Minimum oxygen saturation (%):        80   O2 Saturation Range (%): 80-100  O2Saturation (minutes) <=88%: 0.2 min  Pulse Mean (bpm):    76  Pulse Range (56-107)   IMPRESSION: The HST confirms the presence of severe OSA (obstructive sleep apnea) with an AHI of 62.4/h not much accentuated by position or REM sleep stage.  No prolonged hypoxemia was noted. Normal pulse range, but we can't see cardiac rhythm in HST.    RECOMMENDATION: This severe degree of sleep apnea needs treatment ASAP. I recommend an autotitration CPAP with a mask to be fitted according to patients comfort. The settings will be 5-18 cm water, 2 cm EPR with heated humidification.   INTERPRETING PHYSICIAN:  Larey Seat, MD  [facility] and [facility] Sleep Board certified by The [employer] of Sleep Medicine and Fellow of the [employer] of Neurology. Medical Director of [employer].

## 2020-10-01 NOTE — Progress Notes (Signed)
IMPRESSION: The HST confirms the presence of severe OSA (obstructive sleep apnea) with an AHI of 62.4/h not much accentuated by position or REM sleep stage.  No prolonged hypoxemia was noted. Normal pulse range, but we can't see cardiac rhythm in HST. About 14% of apneas were deemed to be central in origin, making this a complex form of apnea.    RECOMMENDATION: This severe degree of sleep apnea needs treatment ASAP. I recommend an autotitration CPAP with a mask to be fitted according to patients comfort. The settings will be 5-18 cm water, 2 cm EPR with heated humidification.

## 2020-10-01 NOTE — Addendum Note (Signed)
Addended by: Larey Seat on: 10/01/2020 09:47 AM   Modules accepted: Orders

## 2020-10-02 ENCOUNTER — Telehealth: Payer: Self-pay | Admitting: Neurology

## 2020-10-02 NOTE — Telephone Encounter (Signed)
-----   Message from Larey Seat, MD sent at 10/01/2020  9:47 AM EST ----- IMPRESSION: The HST confirms the presence of severe OSA (obstructive sleep apnea) with an AHI of 62.4/h not much accentuated by position or REM sleep stage.  No prolonged hypoxemia was noted. Normal pulse range, but we can't see cardiac rhythm in HST. About 14% of apneas were deemed to be central in origin, making this a complex form of apnea.    RECOMMENDATION: This severe degree of sleep apnea needs treatment ASAP. I recommend an autotitration CPAP with a mask to be fitted according to patients comfort. The settings will be 5-18 cm water, 2 cm EPR with heated humidification.

## 2020-10-02 NOTE — Telephone Encounter (Signed)
I called pt. I advised pt that Dr. Brett Fairy reviewed their sleep study results and found that pt has severe sleep apnea. Dr. Brett Fairy recommends that pt starts auto CPAP. I reviewed PAP compliance expectations with the pt. Pt is agreeable to starting a CPAP. I advised pt that an order will be sent to a DME, [facility] and [facility] will call the pt within about one week after they file with the pt's insurance. [facility]) will show the pt how to use the machine, fit for masks, and troubleshoot the CPAP if needed. A follow up appt will need to be made for insurance purposes with Dr. Brett Fairy or NP. Pt verbalized understanding to arrive 15 minutes early and bring their CPAP. A letter with all of this information in it will be mailed to the pt as a reminder. I verified with the pt that the address we have on file is correct. Pt verbalized understanding of results. Pt had no questions at this time but was encouraged to call back if questions arise. I have sent the order to [facility])  and have received confirmation that they have received the order.

## 2020-10-04 DIAGNOSIS — M533 Sacrococcygeal disorders, not elsewhere classified: Secondary | ICD-10-CM | POA: Diagnosis not present

## 2020-10-04 DIAGNOSIS — M47816 Spondylosis without myelopathy or radiculopathy, lumbar region: Secondary | ICD-10-CM | POA: Diagnosis not present

## 2020-10-31 DIAGNOSIS — M533 Sacrococcygeal disorders, not elsewhere classified: Secondary | ICD-10-CM | POA: Diagnosis not present

## 2020-11-01 DIAGNOSIS — G4733 Obstructive sleep apnea (adult) (pediatric): Secondary | ICD-10-CM | POA: Diagnosis not present

## 2020-11-05 NOTE — Telephone Encounter (Signed)
LVM informing pt of scheduled cpap f/u on 04/06 at 11:00 am with Ward Givens, NP.

## 2020-11-07 DIAGNOSIS — Z8042 Family history of malignant neoplasm of prostate: Secondary | ICD-10-CM | POA: Diagnosis not present

## 2020-11-07 DIAGNOSIS — E291 Testicular hypofunction: Secondary | ICD-10-CM | POA: Diagnosis not present

## 2020-11-07 DIAGNOSIS — C61 Malignant neoplasm of prostate: Secondary | ICD-10-CM | POA: Diagnosis not present

## 2020-11-07 DIAGNOSIS — N323 Diverticulum of bladder: Secondary | ICD-10-CM | POA: Diagnosis not present

## 2020-11-25 DIAGNOSIS — M533 Sacrococcygeal disorders, not elsewhere classified: Secondary | ICD-10-CM | POA: Diagnosis not present

## 2020-12-02 DIAGNOSIS — G4733 Obstructive sleep apnea (adult) (pediatric): Secondary | ICD-10-CM | POA: Diagnosis not present

## 2020-12-04 ENCOUNTER — Ambulatory Visit: Payer: Medicare HMO | Admitting: Adult Health

## 2020-12-04 ENCOUNTER — Encounter: Payer: Self-pay | Admitting: Adult Health

## 2020-12-04 VITALS — BP 140/84 | HR 71 | Ht 71.0 in | Wt 223.0 lb

## 2020-12-04 DIAGNOSIS — Z6832 Body mass index (BMI) 32.0-32.9, adult: Secondary | ICD-10-CM | POA: Diagnosis not present

## 2020-12-04 DIAGNOSIS — G8929 Other chronic pain: Secondary | ICD-10-CM | POA: Diagnosis not present

## 2020-12-04 DIAGNOSIS — Z79899 Other long term (current) drug therapy: Secondary | ICD-10-CM | POA: Diagnosis not present

## 2020-12-04 DIAGNOSIS — M545 Low back pain, unspecified: Secondary | ICD-10-CM | POA: Diagnosis not present

## 2020-12-04 DIAGNOSIS — G4733 Obstructive sleep apnea (adult) (pediatric): Secondary | ICD-10-CM | POA: Diagnosis not present

## 2020-12-04 DIAGNOSIS — M129 Arthropathy, unspecified: Secondary | ICD-10-CM | POA: Diagnosis not present

## 2020-12-04 DIAGNOSIS — F17210 Nicotine dependence, cigarettes, uncomplicated: Secondary | ICD-10-CM | POA: Diagnosis not present

## 2020-12-04 DIAGNOSIS — Z9989 Dependence on other enabling machines and devices: Secondary | ICD-10-CM

## 2020-12-04 DIAGNOSIS — R69 Illness, unspecified: Secondary | ICD-10-CM | POA: Diagnosis not present

## 2020-12-04 DIAGNOSIS — F17200 Nicotine dependence, unspecified, uncomplicated: Secondary | ICD-10-CM | POA: Diagnosis not present

## 2020-12-04 DIAGNOSIS — G894 Chronic pain syndrome: Secondary | ICD-10-CM | POA: Diagnosis not present

## 2020-12-04 DIAGNOSIS — E559 Vitamin D deficiency, unspecified: Secondary | ICD-10-CM | POA: Diagnosis not present

## 2020-12-04 NOTE — Patient Instructions (Signed)
Your Plan:  Continue using CPAP nightly and greater than 4 hours each night Mask refitting ordered If your symptoms worsen or you develop new symptoms please let us know.    Thank you for coming to see us at [facility]. I hope we have been able to provide you high quality care today.  You may receive a patient satisfaction survey over the next few weeks. We would appreciate your feedback and comments so that we may continue to improve ourselves and the health of our patients.  

## 2020-12-04 NOTE — Progress Notes (Signed)
PATIENT: Justin Dunn. DOB: 1961-05-17  REASON FOR VISIT: follow up HISTORY FROM: patient  HISTORY OF PRESENT ILLNESS: Today 12/04/20:  Ms. Clanton is a 60 year old male with a history of obstructive sleep apnea on CPAP.  He returns today for follow-up.  He states that he has the nasal pillows and reports that they have ruptured blisters in his nose.  He does use Vaseline which has been helpful but he also states that some nights he feels that he does not breathe well with this mask.  Also notes that he is a mouth breather.  States that some nights he wakes up between 2 and 3 AM and is unable to get back to sleep.  Returns today for an evaluation.   HISTORY  09/05/20: I don't sleep well, and I am always tired. My doc wants to check out a sleep disorder due to cardiac work up being negative, seizure ruled out. " Justin Dunn confirmed the excessive sleepiness he feels by endorsing the Epworth Sleepiness Scale at 17 out of 24 points, the fatigue severity scale at 46 out of 63 points also very elevated and the geriatric depression scale at 10 out of 15 points which indicates at least the presence of her clinical depression.  He has suffered from insomnia poor sleep quality.  He has a lot of back pain and has been treated at [facility] and evaluated.  Formerly took prednisone to reduce the back pain. The patient also has a history of prostate cancer and surgery he has been followed by Dr. Tresa Endo.  He reports that on March 19, 2020 he went to a [employer] he was prepping food he then went to see his daughter.  Sitting on the balcony the daughter noticed that his hands were shaking and he reported that he was in quite agonizing pain in his back.  He was wearing a brace that day.  He took the brace off stood up felt lightheaded and then fell for a few seconds he seemed to have been out he did have urinary incontinence.   There was no further shaking there was no tonic-clonic  activities and he did not bite his tongue.  As soon as he came back to he was fully oriented.  Possibly he was dehydrated at the time at this sounds more like an orthostatic event.  He did not report any chest pain and he did not go to the ER.  His gabapentin has been increased to 900 mg 3 times daily Mobic 15 mg daily for back pain and has been followed at [facility], where he also received trazodone for sleep.  He had surgery performed by Dr. Bennie Pierini: At spinal scoliosis center, had seen [facility] only due to emergency, and he will now follow with Dr. Jeronimo Norma at [place].    Justin Lenard Simmer. isa left-handed Black  male with a history of Anxiety, At risk for sleep apnea, Chronic low back pain, Depression, ED (erectile dysfunction), History of colon polyps, History of gastric ulcer, Hypertension, Hypogonadism male, Incomplete bladder emptying, Lumbar spine scoliosis, Premature ejaculation, Prostate cancer dx in 2014 [facility]), Retention of urine, unspecified, and Wears glasses.  Familymedical /sleep history:no  other family member on CPAP with OSA, father had prostrate cancer and died of C diff - had a pacemaker. Family history of HTN,  3 siblings snore. 1 sibling with chronic back pain.  Spinal stenosis.    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 He retired from that and lives in a household alone. Family status is divorced, with adult foster children.  Tobacco use:- occasionally 2 a week- .ETOH use ; 2 wine a week.  Caffeine intake in form of Coffee(/) Soda( /) Tea ( /) no  energy drinks. Regular exercise in form of swimming.     Sleep habits are as follows:The patient's dinner time is between 6-7 PM. The patient goes to bed at 9 PM and continues to sleep for increments of 2-4 hours, wakes from pain and for bathroom breaks.   The preferred sleep  position is sideways, with the support of 4 pillows. Dreams are reportedly infrequent. 5-7 AM is the usual rise time. The patient wakes up spontaneously.  He reports not feeling refreshed or restored in AM, with symptoms such as dry mouth, stiffness, and lightheadednes,  morning headaches and residual fatigue.   Naps are taken infrequently, naps are may be taken 2/moth- and not scheduled. Naps asting from 1-2 hours and are more.  REVIEW OF SYSTEMS: Out of a complete 14 system review of symptoms, the patient complains only of the following symptoms, and all other reviewed systems are negative.   ESS 15 FSS 44 ALLERGIES: No Known Allergies  HOME MEDICATIONS: Outpatient Medications Prior to Visit  Medication Sig Dispense Refill  . b complex vitamins tablet Take 1 tablet by mouth daily.    Marland Kitchen BLACK CURRANT SEED OIL PO Take by mouth.    . Calcium-Cholecalciferol (VITA-CALCIUM PO) Take by mouth.     . Cholecalciferol (VITAMIN D3) 1000 units CAPS Take by mouth.     . enalapril (VASOTEC) 20 MG tablet Take 1 tablet (20 mg total) by mouth 2 (two) times daily. 180 tablet 1  . escitalopram (LEXAPRO) 10 MG tablet Take 10 mg by mouth daily.    Marland Kitchen gabapentin (NEURONTIN) 300 MG capsule Take 3 capsules (900 mg total) by mouth 3 (three) times daily. 270 capsule 5  . hydrochlorothiazide (MICROZIDE) 12.5 MG capsule Take 1 capsule (12.5 mg total) by mouth daily. 30 capsule 1  . meloxicam (MOBIC) 15 MG tablet Take 1 tablet (15 mg total) by mouth daily as needed for pain. 90 tablet 1  . Multiple Vitamins-Minerals (ZINC PO) Take by mouth.    . Multiple Vitamins-Minerals-FA (VITATAB MV PO) Take by mouth.    . Olopatadine HCl (PATADAY) 0.2 % SOLN Apply 1 drop to eye daily. 2.5 mL 3  . peppermint oil liquid by Does not apply route as needed.     . predniSONE (DELTASONE) 20 MG tablet Take 2 tablets (40 mg total) by mouth daily with breakfast. 10 tablet 0  . traZODone (DESYREL) 100 MG tablet Take 1 tablet (100 mg  total) by mouth at bedtime. 90 tablet 1  . Turmeric 500 MG CAPS Take 1 tablet by mouth. Reported on 08/19/2015    . zolpidem (AMBIEN) 10 MG tablet Take 1 tablet (10 mg total) by mouth at bedtime as needed for sleep. 30 tablet 0  . amLODipine (NORVASC) 10 MG tablet Take 1 tablet (10 mg total) by mouth daily. 30 tablet 0  . anastrozole (ARIMIDEX) 1 MG tablet Take 1 mg by mouth daily.     . citalopram (CELEXA) 40 MG tablet Take 1 tablet (40 mg total) by mouth daily. 90 tablet 1   No facility-administered medications prior to visit.    PAST MEDICAL HISTORY:  Past Medical History:  Diagnosis Date  . Anxiety   . At risk for sleep apnea    STOP-BANG= 4   SENT TO PCP 04-02-2014  . Chronic low back pain   . Depression   . ED (erectile dysfunction)   . History of colon polyps   . History of gastric ulcer    as child  . Hypertension   . Hypogonadism male   . Incomplete bladder emptying   . Lumbar spine scoliosis   . Premature ejaculation   . Prostate cancer [facility])    Dr Tammi Klippel ( oncologist) and Dr Loel Lofty [facility])   . Retention of urine, unspecified   . Sleep apnea    states never told to wear CPAP  . Wears glasses     PAST SURGICAL HISTORY: Past Surgical History:  Procedure Laterality Date  . APPENDECTOMY  age 61  . COLONOSCOPY W/ POLYPECTOMY  july 2015  . LUMBAR FUSION  04/ 2012   and rod  . PROSTATE BIOPSY    . RADIOACTIVE SEED IMPLANT N/A 04/06/2014   Procedure: RADIOACTIVE SEED IMPLANT;  Surgeon: [name], MD;  Location: [facility];  Service: Urology;  Laterality: N/A;  dr portable     FAMILY HISTORY: Family History  Problem Relation Age of Onset  . Prostate cancer Father   . Hypertension Mother   . Pulmonary fibrosis Mother   . Arthritis Mother   . Diabetes Mother   . Deep vein thrombosis Sister     SOCIAL HISTORY: Social History   Socioeconomic History  . Marital status: Divorced    Spouse name: Not on file  . Number of children:  Not on file  . Years of education: Not on file  . Highest education level: Some college, no degree  Occupational History  . Occupation: disability    Comment: DDD lumbar spine; 2013  Tobacco Use  . Smoking status: Current Some Day Smoker    Packs/day: 1.00    Years: 10.00    Pack years: 10.00    Types: Cigarettes, Cigars  . Smokeless tobacco: Never Used  . Tobacco comment: currently smokes 2 small cigar per day/  quit cigarettes 2011  Vaping Use  . Vaping Use: Never used  Substance and Sexual Activity  . Alcohol use: Yes    Alcohol/week: 0.0 standard drinks    Comment: 1-2 glasses of wine daily  . Drug use: Yes    Types: Marijuana  . Sexual activity: Never    Birth control/protection: Abstinence  Other Topics Concern  . Not on file  Social History Narrative   Marital status: divorced; dating in 2019      Children: one foster son (autistic_      Lives: with foster son      Employment: disability from DDD lumbar      Tobacco: none      Alcohol: none      Drugs:      Exercise: minimal in 2019.      ADLs; drives; independent with ADLs.      Advanced Directives: none; FULL CODE.  Johnsie Cancel Shurley/[phone] [facility] [employer] [phone].   Social Determinants of Health   Financial Resource Strain: Not on file  Food Insecurity: Not on file  Transportation Needs: Not on file  Physical Activity: Not on file  Stress: Not on file  Social Connections: Not on file  Intimate Partner Violence: Not on file      PHYSICAL EXAM  Vitals:  12/04/20 1032  BP: 140/84  Pulse: 71  Weight: 223 lb (101.2 kg)  Height: 5\' 11"  (1.803 m)   Body mass index is 31.1 kg/m.  Generalized: Well developed, in no acute distress   Neurological examination  Mentation: Alert oriented to time, place, history taking. Follows all commands speech and language fluent Cranial nerve II-XII: Pupils were equal round reactive to light. Extraocular movements were full, visual field were full on  confrontational test. Facial sensation and strength were normal. Uvula tongue midline. Head turning and shoulder shrug  were normal and symmetric. Motor: The motor testing reveals 5 over 5 strength of all 4 extremities. Good symmetric motor tone is noted throughout.  Sensory: Sensory testing is intact to soft touch on all 4 extremities. No evidence of extinction is noted.  Coordination: Cerebellar testing reveals good finger-nose-finger and heel-to-shin bilaterally.  Gait and station: Gait is normal.  Reflexes: Deep tendon reflexes are symmetric and normal bilaterally.   DIAGNOSTIC DATA (LABS, IMAGING, TESTING) - I reviewed patient records, labs, notes, testing and imaging myself where available.  Lab Results  Component Value Date   WBC 9.8 03/21/2020   HGB 14.7 03/21/2020   HCT 44.6 03/21/2020   MCV 97 03/21/2020   PLT 254 03/21/2020      Component Value Date/Time   NA 142 03/21/2020 1424   K 4.0 03/21/2020 1424   CL 102 03/21/2020 1424   CO2 26 03/21/2020 1424   GLUCOSE 144 (H) 03/21/2020 1424   GLUCOSE 78 05/06/2016 1212   BUN 24 03/21/2020 1424   CREATININE 1.13 03/21/2020 1424   CREATININE 1.07 05/06/2016 1212   CALCIUM 9.5 03/21/2020 1424   PROT 7.0 11/27/2019 1232   ALBUMIN 4.6 11/27/2019 1232   AST 29 11/27/2019 1232   ALT 20 11/27/2019 1232   ALKPHOS 85 11/27/2019 1232   BILITOT 0.2 11/27/2019 1232   GFRNONAA 70 03/21/2020 1424   GFRAA 81 03/21/2020 1424   Lab Results  Component Value Date   CHOL 142 11/27/2019   HDL 48 11/27/2019   LDLCALC 79 11/27/2019   TRIG 76 11/27/2019   CHOLHDL 3.0 11/27/2019   Lab Results  Component Value Date   HGBA1C 5.8 (H) 04/04/2020   Lab Results  Component Value Date   VITAMINB12 1,100 02/28/2020   Lab Results  Component Value Date   TSH 1.170 09/08/2016      ASSESSMENT AND PLAN 60 y.o. year old male  has a past medical history of Anxiety, At risk for sleep apnea, Chronic low back pain, Depression, ED (erectile  dysfunction), History of colon polyps, History of gastric ulcer, Hypertension, Hypogonadism male, Incomplete bladder emptying, Lumbar spine scoliosis, Premature ejaculation, Prostate cancer ([place]), Retention of urine, unspecified, Sleep apnea, and Wears glasses. here with:  1.  Obstructive sleep apnea on CPAP  --Compliance suboptimal --Residual AHI in normal range when he uses the machine --Encourage patient use CPAP nightly and greater than 4 hours each night --Mask refitting ordered --Follow-up in 6 months or sooner if needed   I spent 30 minutes of face-to-face and non-face-to-face time with patient.  This included previsit chart review, lab review, study review, order entry, electronic health record documentation, patient education.  Ward Givens, MSN, NP-C 12/04/2020, 11:10 AM [facility] [address], [place], [place] [address] [phone]

## 2020-12-09 NOTE — Progress Notes (Signed)
cpap orders sent to [facility]

## 2020-12-18 DIAGNOSIS — E559 Vitamin D deficiency, unspecified: Secondary | ICD-10-CM | POA: Diagnosis not present

## 2020-12-18 DIAGNOSIS — Z6834 Body mass index (BMI) 34.0-34.9, adult: Secondary | ICD-10-CM | POA: Diagnosis not present

## 2020-12-18 DIAGNOSIS — G894 Chronic pain syndrome: Secondary | ICD-10-CM | POA: Diagnosis not present

## 2020-12-18 DIAGNOSIS — G8929 Other chronic pain: Secondary | ICD-10-CM | POA: Diagnosis not present

## 2020-12-18 DIAGNOSIS — M545 Low back pain, unspecified: Secondary | ICD-10-CM | POA: Diagnosis not present

## 2021-01-01 DIAGNOSIS — G4733 Obstructive sleep apnea (adult) (pediatric): Secondary | ICD-10-CM | POA: Diagnosis not present

## 2021-01-16 DIAGNOSIS — G894 Chronic pain syndrome: Secondary | ICD-10-CM | POA: Diagnosis not present

## 2021-01-16 DIAGNOSIS — G8929 Other chronic pain: Secondary | ICD-10-CM | POA: Diagnosis not present

## 2021-01-16 DIAGNOSIS — Z79899 Other long term (current) drug therapy: Secondary | ICD-10-CM | POA: Diagnosis not present

## 2021-01-16 DIAGNOSIS — M545 Low back pain, unspecified: Secondary | ICD-10-CM | POA: Diagnosis not present

## 2021-01-16 DIAGNOSIS — M79671 Pain in right foot: Secondary | ICD-10-CM | POA: Diagnosis not present

## 2021-01-16 DIAGNOSIS — Z6831 Body mass index (BMI) 31.0-31.9, adult: Secondary | ICD-10-CM | POA: Diagnosis not present

## 2021-02-01 DIAGNOSIS — G4733 Obstructive sleep apnea (adult) (pediatric): Secondary | ICD-10-CM | POA: Diagnosis not present

## 2021-02-13 ENCOUNTER — Other Ambulatory Visit: Payer: Self-pay

## 2021-02-13 ENCOUNTER — Ambulatory Visit (INDEPENDENT_AMBULATORY_CARE_PROVIDER_SITE_OTHER): Payer: Medicare HMO

## 2021-02-13 ENCOUNTER — Encounter: Payer: Self-pay | Admitting: Podiatry

## 2021-02-13 ENCOUNTER — Ambulatory Visit: Payer: Medicare HMO | Admitting: Podiatry

## 2021-02-13 DIAGNOSIS — M79675 Pain in left toe(s): Secondary | ICD-10-CM

## 2021-02-13 DIAGNOSIS — M79674 Pain in right toe(s): Secondary | ICD-10-CM | POA: Diagnosis not present

## 2021-02-13 DIAGNOSIS — L84 Corns and callosities: Secondary | ICD-10-CM

## 2021-02-13 DIAGNOSIS — M779 Enthesopathy, unspecified: Secondary | ICD-10-CM | POA: Diagnosis not present

## 2021-02-13 DIAGNOSIS — B351 Tinea unguium: Secondary | ICD-10-CM | POA: Diagnosis not present

## 2021-02-13 NOTE — Progress Notes (Signed)
Subjective:   Patient ID: Justin Dunn., male   DOB: 59 y.o.   MRN: [id number]   HPI Patient presents stating he has a lot of pain in his feet he has significant nail disease of both feet and lesions on both feet that are sore when pressed and just generalized foot pain.  Patient also complains of numbness and is not significantly active.  Patient does not smoke   Review of Systems  All other systems reviewed and are negative.      Objective:  Physical Exam Vitals and nursing note reviewed.  Constitutional:      Appearance: He is well-developed.  Pulmonary:     Effort: Pulmonary effort is normal.  Musculoskeletal:        General: Normal range of motion.  Skin:    General: Skin is warm.  Neurological:     Mental Status: He is alert.    Neurovascular status was found to be intact muscle strength found to be adequate range of motion adequate.  Patient does have moderate flatfoot deformity bilateral structural deformity bilateral with inflammation of the extensor tendon and posterior tibial tendon complex.  Patient has thick yellow brittle nailbeds 1-5 both feet that he cannot cut and they get sore in the corners and lesions on both feet that are tender and he cannot take care of himself.  Good digital perfusion noted     Assessment:  Keratotic lesion formation bilateral along with mycotic nail infection pain moderate tendinitis secondary to foot structure and probable low-grade neuropathy     Plan:  H&P reviewed all conditions and x-rays and today debrided nailbeds 1-5 both feet lesions bilateral no iatrogenic bleeding reappoint routine care and recommended supportive shoes and stretching exercises.  Reappoint to recheck  X-rays indicate that there is moderate flatfoot deformity with structural deformity noted and digital deformities

## 2021-03-03 DIAGNOSIS — G4733 Obstructive sleep apnea (adult) (pediatric): Secondary | ICD-10-CM | POA: Diagnosis not present

## 2021-04-03 DIAGNOSIS — G4733 Obstructive sleep apnea (adult) (pediatric): Secondary | ICD-10-CM | POA: Diagnosis not present

## 2021-04-14 IMAGING — CR DG LUMBAR SPINE COMPLETE 4+V
5 series · 5 of 5 positions shown · non-contrast
Comparison: Lumbar MRI 08/30/2015

CLINICAL DATA: Right lower back pain with right-sided sciatica.
History of prostate cancer. Lumbar fusion.

EXAM:
LUMBAR SPINE - COMPLETE 4+ VIEW

[w lumbar spine ap]
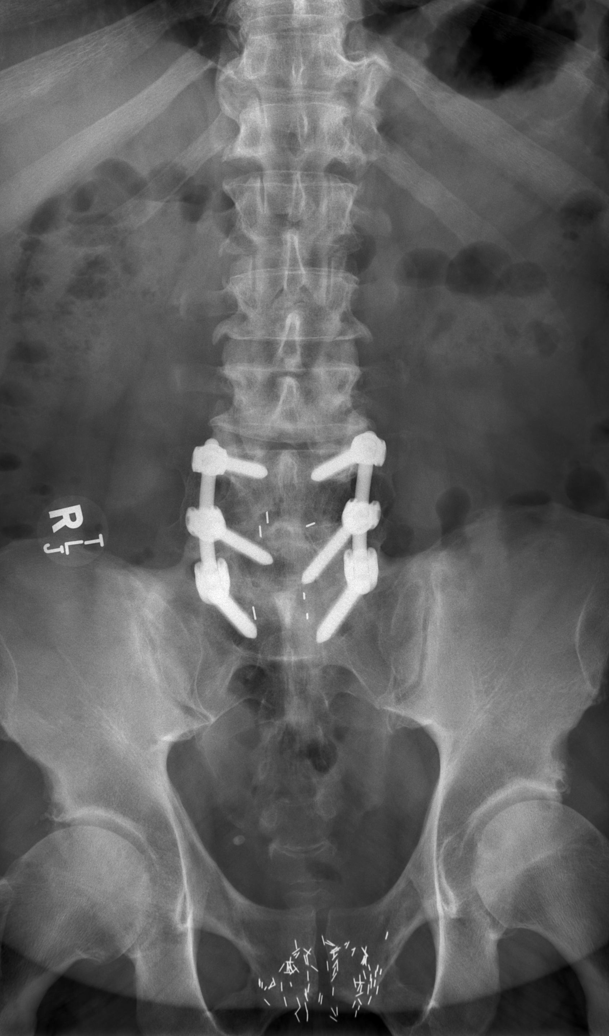

[w lumbar spine obl (1 of 2)]
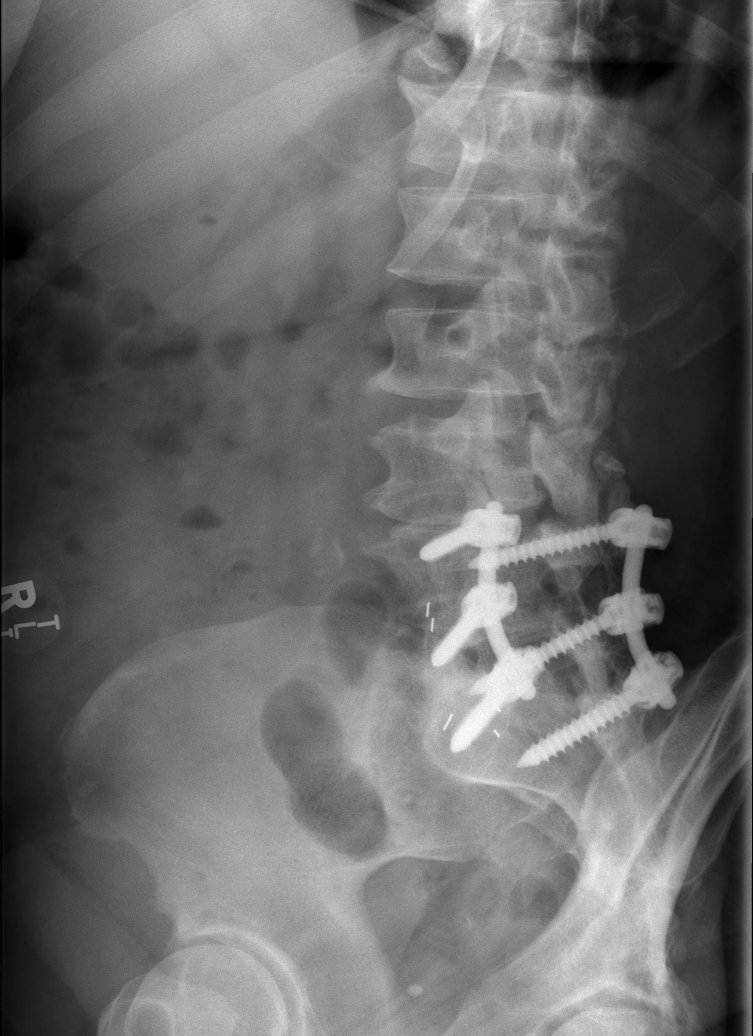

[w lumbar spine obl (2 of 2)]
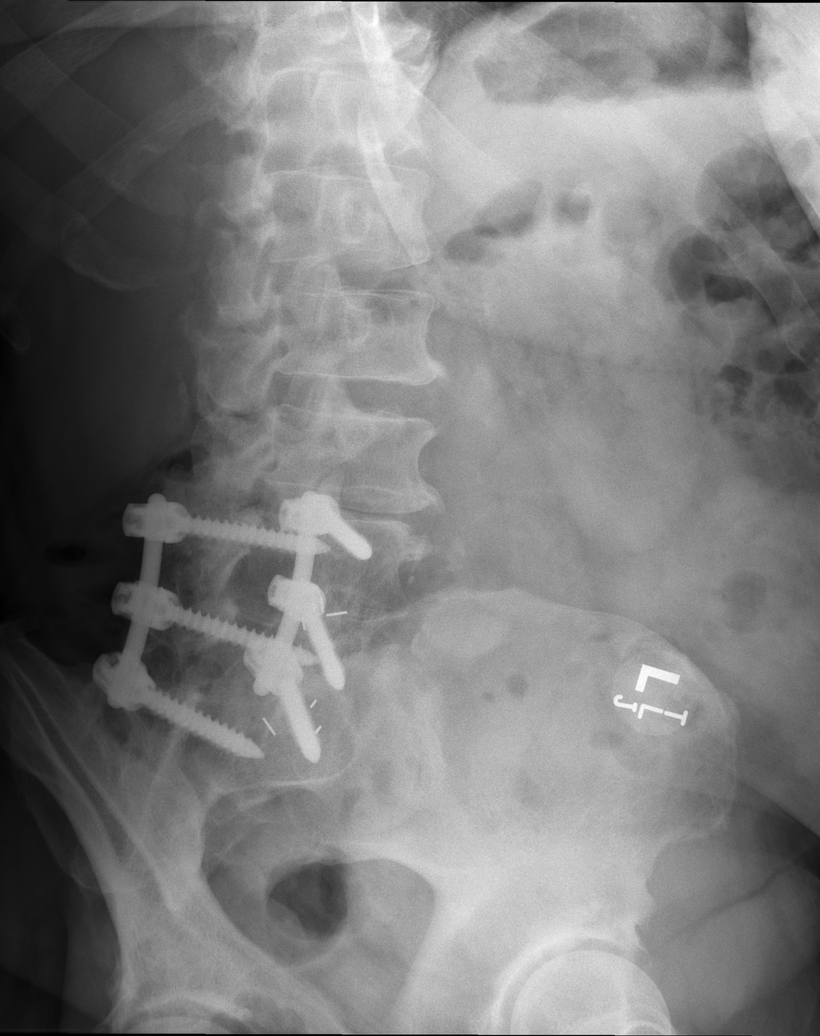

[w lumbar spine lat]
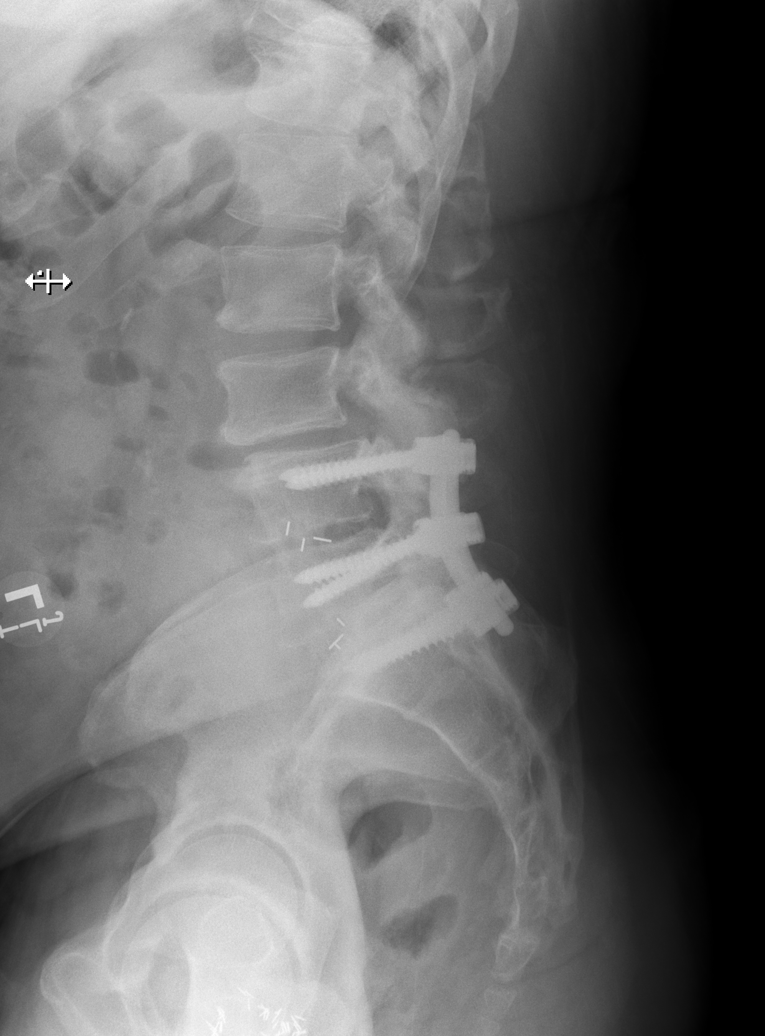

[w lumbar l-5 s-1 spot]
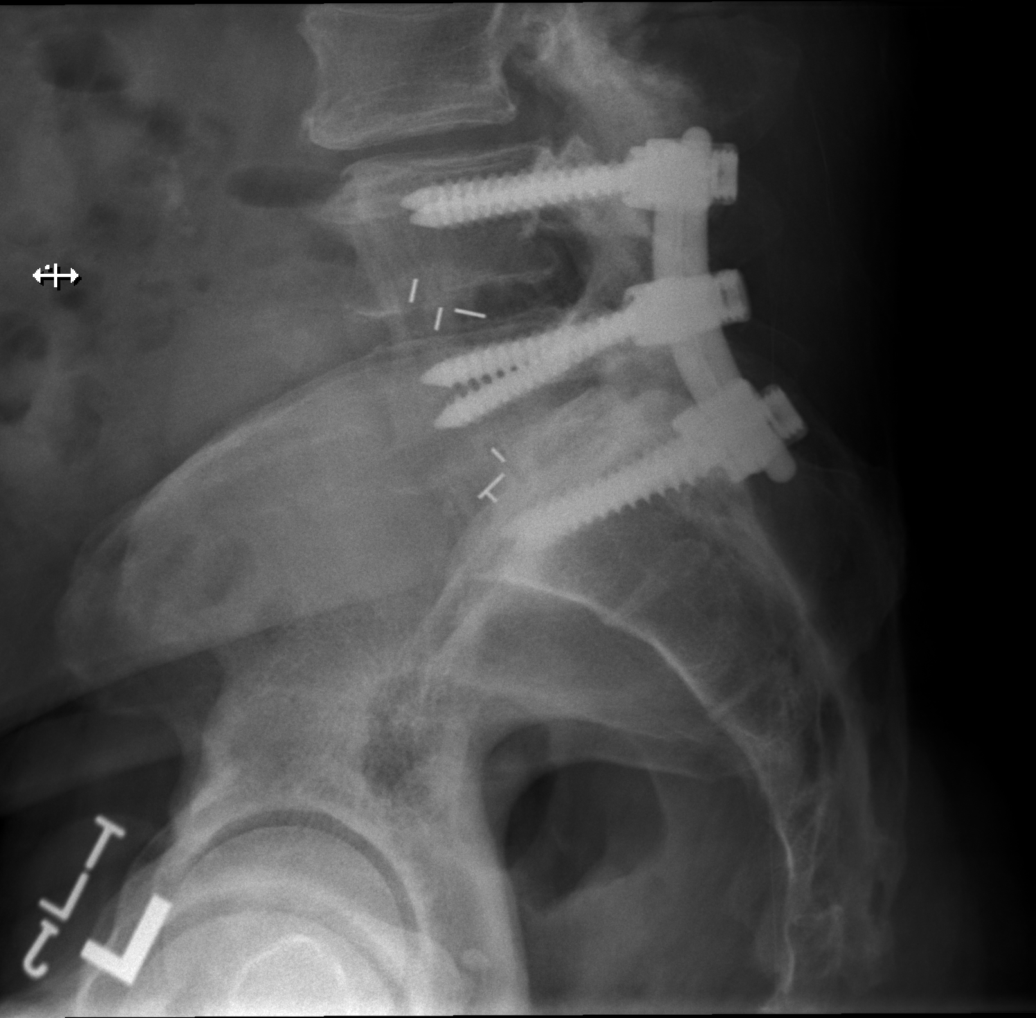

[5 of 5 positions shown; findings below may reference images not displayed]

FINDINGS: Pedicle screw and interbody fusion L4-5 and L5-S1. Hardware in
satisfactory position

6 mm anterolisthesis L3-4 has progressed in the interval. Remaining
alignment normal. Negative for fracture. No acute skeletal
abnormality.

Radioactive seeds in the prostate bed.
IMPRESSION: Satisfactory pedicle screw and interbody fusion L4-5 and L5-S1

Progressive degeneration at L3-4 with grade 1 anterolisthesis.
Spinal stenosis likely present at this level.

## 2021-05-04 DIAGNOSIS — G4733 Obstructive sleep apnea (adult) (pediatric): Secondary | ICD-10-CM | POA: Diagnosis not present

## 2021-06-03 DIAGNOSIS — G4733 Obstructive sleep apnea (adult) (pediatric): Secondary | ICD-10-CM | POA: Diagnosis not present

## 2021-07-04 DIAGNOSIS — G4733 Obstructive sleep apnea (adult) (pediatric): Secondary | ICD-10-CM | POA: Diagnosis not present

## 2021-07-10 ENCOUNTER — Telehealth: Payer: Self-pay | Admitting: Adult Health

## 2021-07-10 NOTE — Telephone Encounter (Signed)
I called pt.  He states that he has trouble breathing (short breathes) this is all the time and he sweats a lot when has the machine/mask on.  I relayed that if breathing issue is all the time, see pcp and he has an appt tomorrow to be evaluated.  I was able to DL his information ( 7 days out of 60).  Be glad to show of MM/NP and as he is due for 6 month app in 05/2021 made appt for him 07-28-21 at 1100 arrive 1030-1045 for check in.  He will use machine until then.  I will check with MM/NP if any changes with machine may help.  He was appreciative.

## 2021-07-10 NOTE — Telephone Encounter (Signed)
I did a DL  (only used 6 days in 60 days.). in inbox.  Will have to work in, no appt available.

## 2021-07-10 NOTE — Telephone Encounter (Signed)
Pt unable to sleep under sleep machine, he states he has difficulty breathing, it causes him to sweat and have pain & tightening in his chest.  Please call.

## 2021-07-11 DIAGNOSIS — Z6833 Body mass index (BMI) 33.0-33.9, adult: Secondary | ICD-10-CM | POA: Diagnosis not present

## 2021-07-11 DIAGNOSIS — Z0000 Encounter for general adult medical examination without abnormal findings: Secondary | ICD-10-CM | POA: Diagnosis not present

## 2021-07-11 DIAGNOSIS — Z79899 Other long term (current) drug therapy: Secondary | ICD-10-CM | POA: Diagnosis not present

## 2021-07-11 DIAGNOSIS — F17200 Nicotine dependence, unspecified, uncomplicated: Secondary | ICD-10-CM | POA: Diagnosis not present

## 2021-07-11 DIAGNOSIS — M5441 Lumbago with sciatica, right side: Secondary | ICD-10-CM | POA: Diagnosis not present

## 2021-07-11 DIAGNOSIS — M5442 Lumbago with sciatica, left side: Secondary | ICD-10-CM | POA: Diagnosis not present

## 2021-07-11 DIAGNOSIS — Z1339 Encounter for screening examination for other mental health and behavioral disorders: Secondary | ICD-10-CM | POA: Diagnosis not present

## 2021-07-11 DIAGNOSIS — E559 Vitamin D deficiency, unspecified: Secondary | ICD-10-CM | POA: Diagnosis not present

## 2021-07-11 DIAGNOSIS — R030 Elevated blood-pressure reading, without diagnosis of hypertension: Secondary | ICD-10-CM | POA: Diagnosis not present

## 2021-07-11 DIAGNOSIS — Z131 Encounter for screening for diabetes mellitus: Secondary | ICD-10-CM | POA: Diagnosis not present

## 2021-07-11 DIAGNOSIS — G8929 Other chronic pain: Secondary | ICD-10-CM | POA: Diagnosis not present

## 2021-07-11 DIAGNOSIS — Z1331 Encounter for screening for depression: Secondary | ICD-10-CM | POA: Diagnosis not present

## 2021-07-11 DIAGNOSIS — Z125 Encounter for screening for malignant neoplasm of prostate: Secondary | ICD-10-CM | POA: Diagnosis not present

## 2021-07-11 DIAGNOSIS — Z114 Encounter for screening for human immunodeficiency virus [HIV]: Secondary | ICD-10-CM | POA: Diagnosis not present

## 2021-07-11 DIAGNOSIS — R5383 Other fatigue: Secondary | ICD-10-CM | POA: Diagnosis not present

## 2021-07-11 DIAGNOSIS — F17210 Nicotine dependence, cigarettes, uncomplicated: Secondary | ICD-10-CM | POA: Diagnosis not present

## 2021-07-11 DIAGNOSIS — Z1159 Encounter for screening for other viral diseases: Secondary | ICD-10-CM | POA: Diagnosis not present

## 2021-07-11 DIAGNOSIS — R69 Illness, unspecified: Secondary | ICD-10-CM | POA: Diagnosis not present

## 2021-07-15 NOTE — Telephone Encounter (Signed)
I called patient.  He was seen [facility] medical on [address] for shortness of breath.  They were going to send him to a gastroenterologist possibly for reflux.  And also did some lab work, he has not received results yet.  I relayed he needs to continue using his CPAP 4 hours at a minimum per night as his insurance compliance would be for 30 days 4 hours per night which we will see him November 28 at 11 arrive at 1030 1045 for his initial CPAP visit .   He verbalized understanding. He appreciated call back

## 2021-07-28 ENCOUNTER — Ambulatory Visit: Payer: Medicare HMO | Admitting: Adult Health

## 2021-07-28 ENCOUNTER — Encounter: Payer: Self-pay | Admitting: Adult Health

## 2021-07-28 VITALS — BP 157/93 | HR 85 | Ht 71.0 in | Wt 228.8 lb

## 2021-07-28 DIAGNOSIS — Z9989 Dependence on other enabling machines and devices: Secondary | ICD-10-CM | POA: Diagnosis not present

## 2021-07-28 DIAGNOSIS — G4733 Obstructive sleep apnea (adult) (pediatric): Secondary | ICD-10-CM | POA: Diagnosis not present

## 2021-07-28 NOTE — Progress Notes (Signed)
PATIENT: Justin Dunn. DOB: [birth year]  REASON FOR VISIT: follow up HISTORY FROM: patient  HISTORY OF PRESENT ILLNESS: Today 07/28/21:  Mr. Justin Dunn is a 60 year old male with a history of obstructive sleep apnea on CPAP.  He returns today for follow-up.  He states that he has been having what he thinks is "panic attacks" and that makes it hard to use his CPAP.  His download is attached  12/04/20: Ms. Justin Dunn is a 60 year old male with a history of obstructive sleep apnea on CPAP.  He returns today for follow-up.  He states that he has the nasal pillows and reports that they have ruptured blisters in his nose.  He does use Vaseline which has been helpful but he also states that some nights he feels that he does not breathe well with this mask.  Also notes that he is a mouth breather.  States that some nights he wakes up between 2 and 3 AM and is unable to get back to sleep.  Returns today for an evaluation.   HISTORY  09/05/20:  I don't sleep well, and I am always tired. My doc wants to check out a sleep disorder due to cardiac work up being negative, seizure ruled out. " Justin Dunn confirmed the excessive sleepiness he feels by endorsing the Epworth Sleepiness Scale at 17 out of 24 points, the fatigue severity scale at 46 out of 63 points also very elevated and the geriatric depression scale at 10 out of 15 points which indicates at least the presence of her clinical depression.  He has suffered from insomnia poor sleep quality.  He has a lot of back pain and has been treated at [facility] and evaluated.  Formerly took prednisone to reduce the back pain. The patient also has a history of prostate cancer and surgery he has been followed by Dr. Tresa Endo.  He reports that on March 19, 2020 he went to a [employer] he was prepping food he then went to see his daughter.  Sitting on the balcony the daughter noticed that his hands were shaking and he reported that he was in quite  agonizing pain in his back.  He was wearing a brace that day.  He took the brace off stood up felt lightheaded and then fell for a few seconds he seemed to have been out he did have urinary incontinence.   There was no further shaking there was no tonic-clonic activities and he did not bite his tongue.  As soon as he came back to he was fully oriented.  Possibly he was dehydrated at the time at this sounds more like an orthostatic event.  He did not report any chest pain and he did not go to the ER.  His gabapentin has been increased to 900 mg 3 times daily Mobic 15 mg daily for back pain and has been followed at [facility], where he also received trazodone for sleep.  He had surgery performed by Dr. Bennie Pierini: At spinal scoliosis center, had seen [facility] only due to emergency, and he will now follow with Dr. Jeronimo Norma at [place].       INoel Kreston Dunn. is a left-handed Black  male with a history of Anxiety, At risk for sleep apnea, Chronic low back pain, Depression, ED (erectile dysfunction), History of colon polyps, History of gastric ulcer, Hypertension, Hypogonadism male, Incomplete bladder emptying, Lumbar spine scoliosis, Premature ejaculation, Prostate cancer dx in 2014 [facility]), Retention of  urine, unspecified, and Wears glasses.    Family medical /sleep history: no  other family member on CPAP with OSA, father had prostrate cancer and died of C diff - had a pacemaker. Family history of HTN,  3 siblings snore. 1 sibling with chronic back pain.  Spinal stenosis.      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 He retired from that and lives in a household alone. Family status is divorced, with adult foster children.  Tobacco use:- occasionally 2 a week- .ETOH use ; 2 wine a week.  Caffeine intake in form of Coffee(/) Soda( /) Tea ( /) no  energy drinks.  Regular exercise in form of swimming.      Sleep habits are as follows: The patient's dinner time is between 6-7 PM. The patient goes to bed at 9 PM and continues to sleep for increments of 2-4 hours, wakes from pain and for bathroom breaks.   The preferred sleep position is sideways, with the support of 4 pillows. Dreams are reportedly infrequent. 5-7 AM is the usual rise time. The patient wakes up spontaneously.  He reports not feeling refreshed or restored in AM, with symptoms such as dry mouth, stiffness, and lightheadednes,  morning headaches and residual fatigue.    Naps are taken infrequently, naps are may be taken 2/moth- and not scheduled. Naps asting from 1-2 hours and are more.   REVIEW OF SYSTEMS: Out of a complete 14 system review of symptoms, the patient complains only of the following symptoms, and all other reviewed systems are negative.   ESS 15 FSS 44 ALLERGIES: No Known Allergies  HOME MEDICATIONS: Outpatient Medications Prior to Visit  Medication Sig Dispense Refill   b complex vitamins tablet Take 1 tablet by mouth daily.     BLACK CURRANT SEED OIL PO Take by mouth.     Calcium-Cholecalciferol (VITA-CALCIUM PO) Take by mouth.      celecoxib (CELEBREX) 100 MG capsule Take 100 mg by mouth 2 (two) times daily.     Cholecalciferol (VITAMIN D3) 1000 units CAPS Take by mouth.      DIALYVITE VITAMIN D3 MAX 1.25 MG (50000 UT) TABS Take 1 tablet by mouth once a week.     enalapril (VASOTEC) 20 MG tablet Take 1 tablet (20 mg total) by mouth 2 (two) times daily. 180 tablet 1   escitalopram (LEXAPRO) 10 MG tablet Take 10 mg by mouth daily.     gabapentin (NEURONTIN) 300 MG capsule Take 3 capsules (900 mg total) by mouth 3 (three) times daily. 270 capsule 5   hydrochlorothiazide (MICROZIDE) 12.5 MG capsule Take 1 capsule (12.5 mg total) by mouth daily. 30 capsule 1   meloxicam (MOBIC) 15 MG tablet Take 1 tablet (15 mg total) by mouth daily as needed for pain. 90 tablet 1    Multiple Vitamins-Minerals (ZINC PO) Take by mouth.     Multiple Vitamins-Minerals-FA (VITATAB MV PO) Take by mouth.     Olopatadine HCl (PATADAY) 0.2 % SOLN Apply 1 drop to eye daily. 2.5 mL 3   peppermint oil liquid by Does not apply route as needed.      predniSONE (DELTASONE) 20 MG tablet Take 2 tablets (40 mg total) by mouth daily with breakfast. 10 tablet 0   traMADol-acetaminophen (ULTRACET) 37.5-325 MG tablet Take 2 tablets by mouth 2 (two) times daily as needed.  Turmeric 500 MG CAPS Take 1 tablet by mouth. Reported on 08/19/2015     zolpidem (AMBIEN) 10 MG tablet Take 1 tablet (10 mg total) by mouth at bedtime as needed for sleep. 30 tablet 0   traZODone (DESYREL) 100 MG tablet Take 1 tablet (100 mg total) by mouth at bedtime. 90 tablet 1   No facility-administered medications prior to visit.    PAST MEDICAL HISTORY: Past Medical History:  Diagnosis Date   Anxiety    At risk for sleep apnea    STOP-BANG= 4   SENT TO PCP 04-02-2014   Chronic low back pain    Depression    ED (erectile dysfunction)    History of colon polyps    History of gastric ulcer    as child   Hypertension    Hypogonadism male    Incomplete bladder emptying    Lumbar spine scoliosis    Premature ejaculation    Prostate cancer ([place])    Dr Tammi Klippel ( oncologist) and Dr Loel Lofty ( [facility])    Retention of urine, unspecified    Sleep apnea    states never told to wear CPAP   Wears glasses     PAST SURGICAL HISTORY: Past Surgical History:  Procedure Laterality Date   APPENDECTOMY  age 67   COLONOSCOPY W/ POLYPECTOMY  july 2015   LUMBAR FUSION  04/ 2012   and rod   PROSTATE BIOPSY     RADIOACTIVE SEED IMPLANT N/A 04/06/2014   Procedure: RADIOACTIVE SEED IMPLANT;  Surgeon: [name], MD;  Location: [place];  Service: Urology;  Laterality: N/A;  dr portable     FAMILY HISTORY: Family History  Problem Relation Age of Onset   Prostate cancer Father     Hypertension Mother    Pulmonary fibrosis Mother    Arthritis Mother    Diabetes Mother    Deep vein thrombosis Sister     SOCIAL HISTORY: Social History   Socioeconomic History   Marital status: Divorced    Spouse name: Not on file   Number of children: Not on file   Years of education: Not on file   Highest education level: Some college, no degree  Occupational History   Occupation: disability    Comment: DDD lumbar spine; 2013  Tobacco Use   Smoking status: Some Days    Types: Cigars   Smokeless tobacco: Never   Tobacco comments:    currently smokes 2 small cigar per day/  quit cigarettes 2011  Vaping Use   Vaping Use: Never used  Substance and Sexual Activity   Alcohol use: Yes    Alcohol/week: 0.0 standard drinks    Comment: 1-2 glasses of wine daily   Drug use: Yes    Types: Marijuana   Sexual activity: Never    Birth control/protection: Abstinence  Other Topics Concern   Not on file  Social History Narrative   Marital status: divorced; dating in 2019      Children: one foster son (autistic_      Lives: with foster son      Employment: disability from DDD lumbar      Tobacco: none      Alcohol: none      Drugs:      Exercise: minimal in 2019.      ADLs; drives; independent with ADLs.      Advanced Directives: none; FULL CODE.  Johnsie Cancel [phone] [facility] [phone].   Social Determinants of Health  Financial Resource Strain: Not on file  Food Insecurity: Not on file  Transportation Needs: Not on file  Physical Activity: Not on file  Stress: Not on file  Social Connections: Not on file  Intimate Partner Violence: Not on file      PHYSICAL EXAM  Vitals:   07/28/21 1051  BP: (!) 157/93  Pulse: 85  Weight: 228 lb 12.8 oz (103.8 kg)  Height: 5\' 11"  (1.803 m)   Body mass index is 31.91 kg/m.  Generalized: Well developed, in no acute distress  Chest: Lungs clear to auscultation bilaterally  Neurological examination   Mentation: Alert oriented to time, place, history taking. Follows all commands speech and language fluent Cranial nerve II-XII: Extraocular movements were full, visual field were full on confrontational test Head turning and shoulder shrug  were normal and symmetric. Motor: The motor testing reveals 5 over 5 strength of all 4 extremities. Good symmetric motor tone is noted throughout.  Sensory: Sensory testing is intact to soft touch on all 4 extremities. No evidence of extinction is noted.  Gait and station: Gait is normal.    DIAGNOSTIC DATA (LABS, IMAGING, TESTING) - I reviewed patient records, labs, notes, testing and imaging myself where available.  Lab Results  Component Value Date   WBC 9.8 03/21/2020   HGB 14.7 03/21/2020   HCT 44.6 03/21/2020   MCV 97 03/21/2020   PLT 254 03/21/2020      Component Value Date/Time   NA 142 03/21/2020 1424   K 4.0 03/21/2020 1424   CL 102 03/21/2020 1424   CO2 26 03/21/2020 1424   GLUCOSE 144 (H) 03/21/2020 1424   GLUCOSE 78 05/06/2016 1212   BUN 24 03/21/2020 1424   CREATININE 1.13 03/21/2020 1424   CREATININE 1.07 05/06/2016 1212   CALCIUM 9.5 03/21/2020 1424   PROT 7.0 11/27/2019 1232   ALBUMIN 4.6 11/27/2019 1232   AST 29 11/27/2019 1232   ALT 20 11/27/2019 1232   ALKPHOS 85 11/27/2019 1232   BILITOT 0.2 11/27/2019 1232   GFRNONAA 70 03/21/2020 1424   GFRAA 81 03/21/2020 1424   Lab Results  Component Value Date   CHOL 142 11/27/2019   HDL 48 11/27/2019   LDLCALC 79 11/27/2019   TRIG 76 11/27/2019   CHOLHDL 3.0 11/27/2019   Lab Results  Component Value Date   HGBA1C 5.8 (H) 04/04/2020   Lab Results  Component Value Date   VITAMINB12 1,100 02/28/2020   Lab Results  Component Value Date   TSH 1.170 09/08/2016      ASSESSMENT AND PLAN 60 y.o. year old male  has a past medical history of Anxiety, At risk for sleep apnea, Chronic low back pain, Depression, ED (erectile dysfunction), History of colon polyps,  History of gastric ulcer, Hypertension, Hypogonadism male, Incomplete bladder emptying, Lumbar spine scoliosis, Premature ejaculation, Prostate cancer ([place]), Retention of urine, unspecified, Sleep apnea, and Wears glasses. here with:  1.  Obstructive sleep apnea on CPAP  --Noncompliance --Had a long discussion with the patient reviewing his sleep study and discussing the importance of using CPAP machine to treat his sleep apnea.  I reviewed the risk associated with untreated sleep apnea.  Patient verbalized understanding. --Patient states that he plans to start using the CPAP nightly and for the entire time he sleeps. --He will follow-up in 6 months for another download   I spent 30 minutes of face-to-face and non-face-to-face time with patient.  This included previsit chart review, lab review, study review, order entry,  electronic health record documentation, patient education.  Ward Givens, MSN, NP-C 07/28/2021, 11:07 AM [facility] [address] [place], [place] [address] [phone]

## 2021-07-28 NOTE — Patient Instructions (Signed)
Continue using CPAP nightly and greater than 4 hours each night °If your symptoms worsen or you develop new symptoms please let us know.  ° °

## 2021-08-03 DIAGNOSIS — G4733 Obstructive sleep apnea (adult) (pediatric): Secondary | ICD-10-CM | POA: Diagnosis not present

## 2021-08-08 DIAGNOSIS — M5441 Lumbago with sciatica, right side: Secondary | ICD-10-CM | POA: Diagnosis not present

## 2021-08-08 DIAGNOSIS — Z23 Encounter for immunization: Secondary | ICD-10-CM | POA: Diagnosis not present

## 2021-08-08 DIAGNOSIS — G8929 Other chronic pain: Secondary | ICD-10-CM | POA: Diagnosis not present

## 2021-08-08 DIAGNOSIS — Z6833 Body mass index (BMI) 33.0-33.9, adult: Secondary | ICD-10-CM | POA: Diagnosis not present

## 2021-08-08 DIAGNOSIS — M5442 Lumbago with sciatica, left side: Secondary | ICD-10-CM | POA: Diagnosis not present

## 2021-08-08 DIAGNOSIS — Z79899 Other long term (current) drug therapy: Secondary | ICD-10-CM | POA: Diagnosis not present

## 2021-08-08 DIAGNOSIS — G4733 Obstructive sleep apnea (adult) (pediatric): Secondary | ICD-10-CM | POA: Diagnosis not present

## 2021-08-08 DIAGNOSIS — R030 Elevated blood-pressure reading, without diagnosis of hypertension: Secondary | ICD-10-CM | POA: Diagnosis not present

## 2021-10-08 DIAGNOSIS — Z79899 Other long term (current) drug therapy: Secondary | ICD-10-CM | POA: Diagnosis not present

## 2021-10-08 DIAGNOSIS — G4733 Obstructive sleep apnea (adult) (pediatric): Secondary | ICD-10-CM | POA: Diagnosis not present

## 2021-10-08 DIAGNOSIS — R0602 Shortness of breath: Secondary | ICD-10-CM | POA: Diagnosis not present

## 2021-10-08 DIAGNOSIS — Z0000 Encounter for general adult medical examination without abnormal findings: Secondary | ICD-10-CM | POA: Diagnosis not present

## 2021-10-08 DIAGNOSIS — Z6833 Body mass index (BMI) 33.0-33.9, adult: Secondary | ICD-10-CM | POA: Diagnosis not present

## 2021-10-08 DIAGNOSIS — R030 Elevated blood-pressure reading, without diagnosis of hypertension: Secondary | ICD-10-CM | POA: Diagnosis not present

## 2021-10-08 DIAGNOSIS — M5441 Lumbago with sciatica, right side: Secondary | ICD-10-CM | POA: Diagnosis not present

## 2021-10-08 DIAGNOSIS — M5442 Lumbago with sciatica, left side: Secondary | ICD-10-CM | POA: Diagnosis not present

## 2021-10-08 DIAGNOSIS — G8929 Other chronic pain: Secondary | ICD-10-CM | POA: Diagnosis not present

## 2021-10-21 DIAGNOSIS — H524 Presbyopia: Secondary | ICD-10-CM | POA: Diagnosis not present

## 2021-10-27 DIAGNOSIS — R059 Cough, unspecified: Secondary | ICD-10-CM | POA: Diagnosis not present

## 2021-10-27 DIAGNOSIS — R69 Illness, unspecified: Secondary | ICD-10-CM | POA: Diagnosis not present

## 2021-10-27 DIAGNOSIS — Z87891 Personal history of nicotine dependence: Secondary | ICD-10-CM | POA: Diagnosis not present

## 2021-10-27 DIAGNOSIS — F17210 Nicotine dependence, cigarettes, uncomplicated: Secondary | ICD-10-CM | POA: Diagnosis not present

## 2021-10-27 DIAGNOSIS — R0602 Shortness of breath: Secondary | ICD-10-CM | POA: Diagnosis not present

## 2021-10-27 DIAGNOSIS — G4733 Obstructive sleep apnea (adult) (pediatric): Secondary | ICD-10-CM | POA: Diagnosis not present

## 2021-11-10 DIAGNOSIS — Z87891 Personal history of nicotine dependence: Secondary | ICD-10-CM | POA: Diagnosis not present

## 2021-11-17 DIAGNOSIS — R69 Illness, unspecified: Secondary | ICD-10-CM | POA: Diagnosis not present

## 2021-11-17 DIAGNOSIS — F17210 Nicotine dependence, cigarettes, uncomplicated: Secondary | ICD-10-CM | POA: Diagnosis not present

## 2021-11-29 DIAGNOSIS — M5442 Lumbago with sciatica, left side: Secondary | ICD-10-CM | POA: Diagnosis not present

## 2021-11-29 DIAGNOSIS — M5441 Lumbago with sciatica, right side: Secondary | ICD-10-CM | POA: Diagnosis not present

## 2021-11-29 DIAGNOSIS — R030 Elevated blood-pressure reading, without diagnosis of hypertension: Secondary | ICD-10-CM | POA: Diagnosis not present

## 2021-11-29 DIAGNOSIS — R69 Illness, unspecified: Secondary | ICD-10-CM | POA: Diagnosis not present

## 2021-11-29 DIAGNOSIS — G8929 Other chronic pain: Secondary | ICD-10-CM | POA: Diagnosis not present

## 2021-11-29 DIAGNOSIS — Z79899 Other long term (current) drug therapy: Secondary | ICD-10-CM | POA: Diagnosis not present

## 2021-11-29 DIAGNOSIS — Z6833 Body mass index (BMI) 33.0-33.9, adult: Secondary | ICD-10-CM | POA: Diagnosis not present

## 2021-11-29 DIAGNOSIS — K760 Fatty (change of) liver, not elsewhere classified: Secondary | ICD-10-CM | POA: Diagnosis not present

## 2021-12-24 ENCOUNTER — Other Ambulatory Visit: Payer: Self-pay | Admitting: Neurology

## 2021-12-24 DIAGNOSIS — F5105 Insomnia due to other mental disorder: Secondary | ICD-10-CM

## 2022-01-27 ENCOUNTER — Ambulatory Visit: Payer: Medicare HMO | Admitting: Adult Health

## 2022-03-30 ENCOUNTER — Telehealth: Payer: Self-pay | Admitting: Adult Health

## 2022-03-30 DIAGNOSIS — G4733 Obstructive sleep apnea (adult) (pediatric): Secondary | ICD-10-CM

## 2022-03-30 NOTE — Telephone Encounter (Signed)
I have reached out to [employer] on this to follow up.

## 2022-03-30 NOTE — Telephone Encounter (Signed)
Pt is calling and said he has never received supplies for his CPAP machine. Pt is requesting a call from the nurse.

## 2022-03-31 NOTE — Telephone Encounter (Signed)
I have reached out to [employer] for the second time to discuss this.

## 2022-04-01 NOTE — Addendum Note (Signed)
Addended by: Verlin Grills on: 04/01/2022 09:14 AM   Modules accepted: Orders

## 2022-04-01 NOTE — Progress Notes (Unsigned)
PATIENT: Justin Dunn. DOB: [birth year]  REASON FOR VISIT: follow up HISTORY FROM: patient  Chief Complaint  Patient presents with   Follow-up    Rm 19 alone here for f/u on CPAP. Reports he has had trouble with his supplies and supplier. Feels like the mask is not best fit, wakes up in the morning feeling lightheaded and low energy.      HISTORY OF PRESENT ILLNESS: Today 04/02/22:  Justin Dunn is a 61 year old male with a history of OSA on CPAP. He returns today for follow-up. Reports that is not using the CPAP because the mask leaks. Reports he has not had new supplies in over a year. Reports that he has acid reflux when he doesn't use CPAP. DL is below    16/10/96: Justin Dunn is a 61 year old male with a history of obstructive sleep apnea on CPAP.  He returns today for follow-up.  He states that he has been having what he thinks is "panic attacks" and that makes it hard to use his CPAP.  His download is attached  12/04/20: Justin Dunn is a 61 year old male with a history of obstructive sleep apnea on CPAP.  He returns today for follow-up.  He states that he has the nasal pillows and reports that they have ruptured blisters in his nose.  He does use Vaseline which has been helpful but he also states that some nights he feels that he does not breathe well with this mask.  Also notes that he is a mouth breather.  States that some nights he wakes up between 2 and 3 AM and is unable to get back to sleep.  Returns today for an evaluation.   HISTORY  09/05/20:  I don't sleep well, and I am always tired. My doc wants to check out a sleep disorder due to cardiac work up being negative, seizure ruled out. " Justin Dunn confirmed the excessive sleepiness he feels by endorsing the Epworth Sleepiness Scale at 17 out of 24 points, the fatigue severity scale at 46 out of 63 points also very elevated and the geriatric depression scale at 10 out of 15 points which indicates at least the presence  of her clinical depression.  He has suffered from insomnia poor sleep quality.  He has a lot of back pain and has been treated at [facility] and evaluated.  Formerly took prednisone to reduce the back pain. The patient also has a history of prostate cancer and surgery he has been followed by Dr. Gaynelle Arabian.  He reports that on March 19, 2020 he went to a [employer] he was prepping food he then went to see his daughter.  Sitting on the balcony the daughter noticed that his hands were shaking and he reported that he was in quite agonizing pain in his back.  He was wearing a brace that day.  He took the brace off stood up felt lightheaded and then fell for a few seconds he seemed to have been out he did have urinary incontinence.   There was no further shaking there was no tonic-clonic activities and he did not bite his tongue.  As soon as he came back to he was fully oriented.  Possibly he was dehydrated at the time at this sounds more like an orthostatic event.  He did not report any chest pain and he did not go to the ER.  His gabapentin has been increased to 900 mg 3 times daily Mobic  15 mg daily for back pain and has been followed at [facility], where he also received trazodone for sleep.  He had surgery performed by Dr. Jake Seats: At spinal scoliosis center, had seen [facility] only due to emergency, and he will now follow with Dr. Raford Pitcher at [facility].       Justin Malahki Stjuste. is a left-handed Black  male with a history of Anxiety, At risk for sleep apnea, Chronic low back pain, Depression, ED (erectile dysfunction), History of colon polyps, History of gastric ulcer, Hypertension, Hypogonadism male, Incomplete bladder emptying, Lumbar spine scoliosis, Premature ejaculation, Prostate cancer dx in 2014 [facility]), Retention of urine, unspecified, and Wears glasses.    Family medical /sleep history: no  other family member on CPAP with OSA, father had prostrate cancer and  died of C diff - had a pacemaker. Family history of HTN,  3 siblings snore. 1 sibling with chronic back pain.  Spinal stenosis.      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 He retired from that and lives in a household alone. Family status is divorced, with adult foster children.  Tobacco use:- occasionally 2 a week- .ETOH use ; 2 wine a week.  Caffeine intake in form of Coffee(/) Soda( /) Tea ( /) no  energy drinks. Regular exercise in form of swimming.      Sleep habits are as follows: The patient's dinner time is between 6-7 PM. The patient goes to bed at 9 PM and continues to sleep for increments of 2-4 hours, wakes from pain and for bathroom breaks.   The preferred sleep position is sideways, with the support of 4 pillows. Dreams are reportedly infrequent. 5-7 AM is the usual rise time. The patient wakes up spontaneously.  He reports not feeling refreshed or restored in AM, with symptoms such as dry mouth, stiffness, and lightheadednes,  morning headaches and residual fatigue.    Naps are taken infrequently, naps are may be taken 2/moth- and not scheduled. Naps asting from 1-2 hours and are more.   REVIEW OF SYSTEMS: Out of a complete 14 system review of symptoms, the patient complains only of the following symptoms, and all other reviewed systems are negative.   ESS 15 FSS 44 ALLERGIES: No Known Allergies  HOME MEDICATIONS: Outpatient Medications Prior to Visit  Medication Sig Dispense Refill   b complex vitamins tablet Take 1 tablet by mouth daily.     citalopram (CELEXA) 40 MG tablet Take 40 mg by mouth daily.     lisinopril-hydrochlorothiazide (ZESTORETIC) 20-12.5 MG tablet Take 1 tablet by mouth daily.     traMADol-acetaminophen (ULTRACET) 37.5-325 MG tablet Take 2 tablets by mouth 2 (two) times daily as needed.     traZODone (DESYREL) 50 MG tablet Take 50 mg by  mouth daily.     Turmeric 500 MG CAPS Take 1 tablet by mouth. Reported on 08/19/2015     BLACK CURRANT SEED OIL PO Take by mouth.     Calcium-Cholecalciferol (VITA-CALCIUM PO) Take by mouth.      celecoxib (CELEBREX) 100 MG capsule Take 100 mg by mouth 2 (two) times daily.     Cholecalciferol (VITAMIN D3) 1000 units CAPS Take by mouth.      DIALYVITE VITAMIN D3 MAX 1.25 MG (50000 UT) TABS Take 1 tablet by mouth once a week.     enalapril (VASOTEC)  20 MG tablet Take 1 tablet (20 mg total) by mouth 2 (two) times daily. 180 tablet 1   escitalopram (LEXAPRO) 10 MG tablet Take 10 mg by mouth daily.     gabapentin (NEURONTIN) 300 MG capsule Take 3 capsules (900 mg total) by mouth 3 (three) times daily. 270 capsule 5   hydrochlorothiazide (MICROZIDE) 12.5 MG capsule Take 1 capsule (12.5 mg total) by mouth daily. 30 capsule 1   meloxicam (MOBIC) 15 MG tablet Take 1 tablet (15 mg total) by mouth daily as needed for pain. 90 tablet 1   Multiple Vitamins-Minerals (ZINC PO) Take by mouth.     Multiple Vitamins-Minerals-FA (VITATAB MV PO) Take by mouth.     Olopatadine HCl (PATADAY) 0.2 % SOLN Apply 1 drop to eye daily. 2.5 mL 3   peppermint oil liquid by Does not apply route as needed.      predniSONE (DELTASONE) 20 MG tablet Take 2 tablets (40 mg total) by mouth daily with breakfast. 10 tablet 0   zolpidem (AMBIEN) 10 MG tablet Take 1 tablet (10 mg total) by mouth at bedtime as needed for sleep. 30 tablet 0   No facility-administered medications prior to visit.    PAST MEDICAL HISTORY: Past Medical History:  Diagnosis Date   Anxiety    At risk for sleep apnea    STOP-BANG= 4   SENT TO PCP 04-02-2014   Chronic low back pain    Depression    ED (erectile dysfunction)    History of colon polyps    History of gastric ulcer    as child   Hypertension    Hypogonadism male    Incomplete bladder emptying    Lumbar spine scoliosis    Premature ejaculation    Prostate cancer (HCC)    Dr Kathrynn Running (  oncologist) and Dr Wynelle Link ( [facility])    Retention of urine, unspecified    Sleep apnea    states never told to wear CPAP   Wears glasses     PAST SURGICAL HISTORY: Past Surgical History:  Procedure Laterality Date   APPENDECTOMY  age 78   COLONOSCOPY W/ POLYPECTOMY  july 2015   LUMBAR FUSION  04/ 2012   and rod   PROSTATE BIOPSY     RADIOACTIVE SEED IMPLANT N/A 04/06/2014   Procedure: RADIOACTIVE SEED IMPLANT;  Surgeon: Garnett Farm, MD;  Location[facility];  Service: Urology;  Laterality: N/A;  dr portable     FAMILY HISTORY: Family History  Problem Relation Age of Onset   Prostate cancer Father    Hypertension Mother    Pulmonary fibrosis Mother    Arthritis Mother    Diabetes Mother    Deep vein thrombosis Sister     SOCIAL HISTORY: Social History   Socioeconomic History   Marital status: Divorced    Spouse name: Not on file   Number of children: Not on file   Years of education: Not on file   Highest education level: Some college, no degree  Occupational History   Occupation: disability    Comment: DDD lumbar spine; 2013  Tobacco Use   Smoking status: Some Days    Types: Cigars   Smokeless tobacco: Never   Tobacco comments:    currently smokes 2 small cigar per day/  quit cigarettes 2011  Vaping Use   Vaping Use: Never used  Substance and Sexual Activity   Alcohol use: Yes    Alcohol/week: 0.0 standard drinks of alcohol  Comment: 1-2 glasses of wine daily   Drug use: Yes    Types: Marijuana   Sexual activity: Never    Birth control/protection: Abstinence  Other Topics Concern   Not on file  Social History Narrative   Marital status: divorced; dating in 2019      Children: one foster son (autistic_      Lives: with foster son      Employment: disability from DDD lumbar      Tobacco: none      Alcohol: none      Drugs:      Exercise: minimal in 2019.      ADLs; drives; independent with ADLs.      Advanced Directives:  none; FULL CODE.  Domingo Cocking Groll/[id number] Owens Loffler [phone].   Social Determinants of Health   Financial Resource Strain: High Risk (09/09/2017)   Overall Financial Resource Strain (CARDIA)    Difficulty of Paying Living Expenses: Hard  Food Insecurity: Food Insecurity Present (09/09/2017)   Hunger Vital Sign    Worried About Running Out of Food in the Last Year: Often true    Ran Out of Food in the Last Year: Often true  Transportation Needs: No Transportation Needs (09/09/2017)   PRAPARE - Administrator, Civil Service (Medical): No    Lack of Transportation (Non-Medical): No  Physical Activity: Insufficiently Active (09/09/2017)   Exercise Vital Sign    Days of Exercise per Week: 2 days    Minutes of Exercise per Session: 60 min  Stress: Stress Concern Present (09/09/2017)   [employer] of Occupational Health - Occupational Stress Questionnaire    Feeling of Stress : Rather much  Social Connections: Somewhat Isolated (09/09/2017)   Social Connection and Isolation Panel [NHANES]    Frequency of Communication with Friends and Family: More than three times a week    Frequency of Social Gatherings with Friends and Family: More than three times a week    Attends Religious Services: More than 4 times per year    Active Member of [employer] or Organizations: No    Attends Banker Meetings: Never    Marital Status: Divorced  Catering manager Violence: Not At Risk (09/09/2017)   Humiliation, Afraid, Rape, and Kick questionnaire    Fear of Current or Ex-Partner: No    Emotionally Abused: No    Physically Abused: No    Sexually Abused: No      PHYSICAL EXAM  Vitals:   04/02/22 1122  Weight: 225 lb (102.1 kg)  Height: 5\' 11"  (1.803 m)   Body mass index is 31.38 kg/m.  Generalized: Well developed, in no acute distress  Chest: Lungs clear to auscultation bilaterally  Neurological examination  Mentation: Alert oriented to time, place,  history taking. Follows all commands speech and language fluent Cranial nerve II-XII: Extraocular movements were full, visual field were full on confrontational test Head turning and shoulder shrug  were normal and symmetric. Gait and station: Gait is normal.    DIAGNOSTIC DATA (LABS, IMAGING, TESTING) - I reviewed patient records, labs, notes, testing and imaging myself where available.  Lab Results  Component Value Date   WBC 9.8 03/21/2020   HGB 14.7 03/21/2020   HCT 44.6 03/21/2020   MCV 97 03/21/2020   PLT 254 03/21/2020      Component Value Date/Time   NA 142 03/21/2020 1424   K 4.0 03/21/2020 1424   CL 102 03/21/2020 1424   CO2 26 03/21/2020 1424  GLUCOSE 144 (H) 03/21/2020 1424   GLUCOSE 78 05/06/2016 1212   BUN 24 03/21/2020 1424   CREATININE 1.13 03/21/2020 1424   CREATININE 1.07 05/06/2016 1212   CALCIUM 9.5 03/21/2020 1424   PROT 7.0 11/27/2019 1232   ALBUMIN 4.6 11/27/2019 1232   AST 29 11/27/2019 1232   ALT 20 11/27/2019 1232   ALKPHOS 85 11/27/2019 1232   BILITOT 0.2 11/27/2019 1232   GFRNONAA 70 03/21/2020 1424   GFRAA 81 03/21/2020 1424   Lab Results  Component Value Date   CHOL 142 11/27/2019   HDL 48 11/27/2019   LDLCALC 79 11/27/2019   TRIG 76 11/27/2019   CHOLHDL 3.0 11/27/2019   Lab Results  Component Value Date   HGBA1C 5.8 (H) 04/04/2020   Lab Results  Component Value Date   VITAMINB12 1,100 02/28/2020   Lab Results  Component Value Date   TSH 1.170 09/08/2016      ASSESSMENT AND PLAN 61 y.o. year old male  has a past medical history of Anxiety, At risk for sleep apnea, Chronic low back pain, Depression, ED (erectile dysfunction), History of colon polyps, History of gastric ulcer, Hypertension, Hypogonadism male, Incomplete bladder emptying, Lumbar spine scoliosis, Premature ejaculation, Prostate cancer (HCC), Retention of urine, unspecified, Sleep apnea, and Wears glasses. here with:  1.  Obstructive sleep apnea on  CPAP  --Noncompliance --Order sent for mask refitting.  --Patient states that he plans to start using the CPAP once he get a new mask. Encouraged the patient to try to use the machine nightly to help him get adjusted to using it. --He will call in 1 month for me to look at a DL and then FU in 3 months.      Butch Penny, MSN, NP-C 04/02/2022, 11:26 AM [facility] [address], [address] [place], [place] [address] [phone]

## 2022-04-01 NOTE — Telephone Encounter (Signed)
CPAP supply order e-signed by Jinny Blossom NP. Message sent to [place].

## 2022-04-01 NOTE — Telephone Encounter (Addendum)
Received this messages from [employer] today; [place], Bynum Bellows, RN; Ocie Bob Patient will be reached out to today [place], Bynum Bellows, RN; Marchelle Gearing; [place], [place] Alt - Can you please send an updated Rx for his supplies?  The one we have expired on 10-03-21   I have placed CPAP supply renewal.

## 2022-04-02 ENCOUNTER — Encounter: Payer: Self-pay | Admitting: Adult Health

## 2022-04-02 ENCOUNTER — Ambulatory Visit: Payer: Medicare HMO | Admitting: Adult Health

## 2022-04-02 VITALS — BP 178/97 | HR 78 | Ht 71.0 in | Wt 225.0 lb

## 2022-04-02 DIAGNOSIS — Z9989 Dependence on other enabling machines and devices: Secondary | ICD-10-CM | POA: Diagnosis not present

## 2022-04-02 DIAGNOSIS — G4733 Obstructive sleep apnea (adult) (pediatric): Secondary | ICD-10-CM | POA: Diagnosis not present

## 2022-04-02 NOTE — Patient Instructions (Signed)
Your Plan:  Mask refitting ordered Start using CPAP nightly and greater than 4 hours each night If your symptoms worsen or you develop new symptoms please let us know.       Thank you for coming to see [place] at [facility]. I hope we have been able to provide you high quality care today.  You may receive a patient satisfaction survey over the next few weeks. We would appreciate your feedback and comments so that we may continue to improve ourselves and the health of our patients.

## 2022-04-06 NOTE — Progress Notes (Signed)
Hepler, Melanie Ricki Rodriguez, RN; Vanessa Ralphs got it      Previous Messages    ----- Message -----  From: Brandon Melnick, RN  Sent: 04/06/2022   8:40 AM EDT  To: Vanessa Ralphs; Orrin Brigham Hepler  Subject: mask refiting                                   Good morning,  new order in Epic.  Mask refitting needs new supplies.  Thanks,  [place] Talbert Cage.  Male, 61 y.o., November 02, 1960  MRN:  [id number]

## 2022-04-06 NOTE — Telephone Encounter (Signed)
[place], [address]  [place], Lourena Simmonds, RN; [place], [place]; Marchelle Gearing got it

## 2022-04-21 DIAGNOSIS — R059 Cough, unspecified: Secondary | ICD-10-CM | POA: Diagnosis not present

## 2022-04-21 DIAGNOSIS — Z1159 Encounter for screening for other viral diseases: Secondary | ICD-10-CM | POA: Diagnosis not present

## 2022-04-21 DIAGNOSIS — J209 Acute bronchitis, unspecified: Secondary | ICD-10-CM | POA: Diagnosis not present

## 2022-05-03 DIAGNOSIS — G4733 Obstructive sleep apnea (adult) (pediatric): Secondary | ICD-10-CM | POA: Diagnosis not present

## 2022-06-02 DIAGNOSIS — G4733 Obstructive sleep apnea (adult) (pediatric): Secondary | ICD-10-CM | POA: Diagnosis not present

## 2022-07-13 ENCOUNTER — Encounter: Payer: Self-pay | Admitting: *Deleted

## 2022-07-13 NOTE — Progress Notes (Deleted)
PATIENT: Justin Dunn. DOB: 06/28/1961  REASON FOR VISIT: follow up HISTORY FROM: patient PRIMARY NEUROLOGIST: Dr. Brett Fairy  No chief complaint on file.    HISTORY OF PRESENT ILLNESS: Today 07/14/22    REVIEW OF SYSTEMS: Out of a complete 14 system review of symptoms, the patient complains only of the following symptoms, and all other reviewed systems are negative.  FSS ESS  ALLERGIES: No Known Allergies  HOME MEDICATIONS: Outpatient Medications Prior to Visit  Medication Sig Dispense Refill   b complex vitamins tablet Take 1 tablet by mouth daily.     citalopram (CELEXA) 40 MG tablet Take 40 mg by mouth daily.     lisinopril-hydrochlorothiazide (ZESTORETIC) 20-12.5 MG tablet Take 1 tablet by mouth daily.     traMADol-acetaminophen (ULTRACET) 37.5-325 MG tablet Take 2 tablets by mouth 2 (two) times daily as needed.     traZODone (DESYREL) 50 MG tablet Take 50 mg by mouth daily.     Turmeric 500 MG CAPS Take 1 tablet by mouth. Reported on 08/19/2015     No facility-administered medications prior to visit.    PAST MEDICAL HISTORY: Past Medical History:  Diagnosis Date   Anxiety    At risk for sleep apnea    STOP-BANG= 4   SENT TO PCP 04-02-2014   Chronic low back pain    Depression    ED (erectile dysfunction)    History of colon polyps    History of gastric ulcer    as child   Hypertension    Hypogonadism male    Incomplete bladder emptying    Lumbar spine scoliosis    Premature ejaculation    Prostate cancer ([place])    Dr Tammi Klippel ( oncologist) and Dr Loel Lofty ( [facility])    Retention of urine, unspecified    Sleep apnea    states never told to wear CPAP   Wears glasses     PAST SURGICAL HISTORY: Past Surgical History:  Procedure Laterality Date   APPENDECTOMY  age 21   COLONOSCOPY W/ POLYPECTOMY  july 2015   LUMBAR FUSION  04/ 2012   and rod   PROSTATE BIOPSY     RADIOACTIVE SEED IMPLANT N/A 04/06/2014   Procedure: RADIOACTIVE SEED  IMPLANT;  Surgeon: [name], MD;  Location: [place];  Service: Urology;  Laterality: N/A;  dr portable     FAMILY HISTORY: Family History  Problem Relation Age of Onset   Prostate cancer Father    Hypertension Mother    Pulmonary fibrosis Mother    Arthritis Mother    Diabetes Mother    Deep vein thrombosis Sister     SOCIAL HISTORY: Social History   Socioeconomic History   Marital status: Divorced    Spouse name: Not on file   Number of children: Not on file   Years of education: Not on file   Highest education level: Some college, no degree  Occupational History   Occupation: disability    Comment: DDD lumbar spine; 2013  Tobacco Use   Smoking status: Some Days    Types: Cigars   Smokeless tobacco: Never   Tobacco comments:    currently smokes 2 small cigar per day/  quit cigarettes 2011  Vaping Use   Vaping Use: Never used  Substance and Sexual Activity   Alcohol use: Yes    Alcohol/week: 0.0 standard drinks of alcohol    Comment: 1-2 glasses of wine daily   Drug use: Yes  Types: Marijuana   Sexual activity: Never    Birth control/protection: Abstinence  Other Topics Concern   Not on file  Social History Narrative   Marital status: divorced; dating in 2019      Children: one foster son (autistic_      Lives: with foster son      Employment: disability from DDD lumbar      Tobacco: none      Alcohol: none      Drugs:      Exercise: minimal in 2019.      ADLs; drives; independent with ADLs.      Advanced Directives: none; FULL CODE.  Johnsie Cancel Dildine/[phone] [employer] [phone].   Social Determinants of Health   Financial Resource Strain: High Risk (09/09/2017)   Overall Financial Resource Strain (CARDIA)    Difficulty of Paying Living Expenses: Hard  Food Insecurity: Food Insecurity Present (09/09/2017)   Hunger Vital Sign    Worried About Running Out of Food in the Last Year: Often true    Ran Out of Food in  the Last Year: Often true  Transportation Needs: No Transportation Needs (09/09/2017)   PRAPARE - Hydrologist (Medical): No    Lack of Transportation (Non-Medical): No  Physical Activity: Insufficiently Active (09/09/2017)   Exercise Vital Sign    Days of Exercise per Week: 2 days    Minutes of Exercise per Session: 60 min  Stress: Stress Concern Present (09/09/2017)   Thermal    Feeling of Stress : Rather much  Social Connections: Somewhat Isolated (09/09/2017)   Social Connection and Isolation Panel [NHANES]    Frequency of Communication with Friends and Family: More than three times a week    Frequency of Social Gatherings with Friends and Family: More than three times a week    Attends Religious Services: More than 4 times per year    Active Member of [employer] or Organizations: No    Attends Archivist Meetings: Never    Marital Status: Divorced  Human resources officer Violence: Not At Risk (09/09/2017)   Humiliation, Afraid, Rape, and Kick questionnaire    Fear of Current or Ex-Partner: No    Emotionally Abused: No    Physically Abused: No    Sexually Abused: No      PHYSICAL EXAM  There were no vitals filed for this visit. There is no height or weight on file to calculate BMI.  Generalized: Well developed, in no acute distress  Chest: Lungs clear to auscultation bilaterally  Neurological examination  Mentation: Alert oriented to time, place, history taking. Follows all commands speech and language fluent Cranial nerve II-XII: Extraocular movements were full, visual field were full on confrontational test Head turning and shoulder shrug  were normal and symmetric. Motor: The motor testing reveals 5 over 5 strength of all 4 extremities. Good symmetric motor tone is noted throughout.  Sensory: Sensory testing is intact to soft touch on all 4 extremities. No evidence of extinction  is noted.  Gait and station: Gait is normal.    DIAGNOSTIC DATA (LABS, IMAGING, TESTING) - I reviewed patient records, labs, notes, testing and imaging myself where available.  Lab Results  Component Value Date   WBC 9.8 03/21/2020   HGB 14.7 03/21/2020   HCT 44.6 03/21/2020   MCV 97 03/21/2020   PLT 254 03/21/2020      Component Value Date/Time   NA 142 03/21/2020 1424  K 4.0 03/21/2020 1424   CL 102 03/21/2020 1424   CO2 26 03/21/2020 1424   GLUCOSE 144 (H) 03/21/2020 1424   GLUCOSE 78 05/06/2016 1212   BUN 24 03/21/2020 1424   CREATININE 1.13 03/21/2020 1424   CREATININE 1.07 05/06/2016 1212   CALCIUM 9.5 03/21/2020 1424   PROT 7.0 11/27/2019 1232   ALBUMIN 4.6 11/27/2019 1232   AST 29 11/27/2019 1232   ALT 20 11/27/2019 1232   ALKPHOS 85 11/27/2019 1232   BILITOT 0.2 11/27/2019 1232   GFRNONAA 70 03/21/2020 1424   GFRAA 81 03/21/2020 1424   Lab Results  Component Value Date   CHOL 142 11/27/2019   HDL 48 11/27/2019   LDLCALC 79 11/27/2019   TRIG 76 11/27/2019   CHOLHDL 3.0 11/27/2019   Lab Results  Component Value Date   HGBA1C 5.8 (H) 04/04/2020   Lab Results  Component Value Date   VITAMINB12 1,100 02/28/2020   Lab Results  Component Value Date   TSH 1.170 09/08/2016      ASSESSMENT AND PLAN 61 y.o. year old male  has a past medical history of Anxiety, At risk for sleep apnea, Chronic low back pain, Depression, ED (erectile dysfunction), History of colon polyps, History of gastric ulcer, Hypertension, Hypogonadism male, Incomplete bladder emptying, Lumbar spine scoliosis, Premature ejaculation, Prostate cancer ([place]), Retention of urine, unspecified, Sleep apnea, and Wears glasses. here with:  OSA on CPAP  - CPAP compliance excellent - Good treatment of AHI  - Encourage patient to use CPAP nightly and > 4 hours each night - F/U in 1 year or sooner if needed   I spent *** minutes of face-to-face and non-face-to-face time with patient.  This  included previsit chart review, lab review, study review, order entry, electronic health record documentation, patient education.  Ward Givens, MSN, NP-C 07/14/2022, 12:12 PM [facility] [address] [place], [place] [address] [phone]

## 2022-07-14 ENCOUNTER — Telehealth: Payer: Medicare HMO | Admitting: Adult Health

## 2022-07-28 DIAGNOSIS — R030 Elevated blood-pressure reading, without diagnosis of hypertension: Secondary | ICD-10-CM | POA: Diagnosis not present

## 2022-07-28 DIAGNOSIS — Z79899 Other long term (current) drug therapy: Secondary | ICD-10-CM | POA: Diagnosis not present

## 2022-07-28 DIAGNOSIS — M5442 Lumbago with sciatica, left side: Secondary | ICD-10-CM | POA: Diagnosis not present

## 2022-07-28 DIAGNOSIS — Z6833 Body mass index (BMI) 33.0-33.9, adult: Secondary | ICD-10-CM | POA: Diagnosis not present

## 2022-07-28 DIAGNOSIS — E7800 Pure hypercholesterolemia, unspecified: Secondary | ICD-10-CM | POA: Diagnosis not present

## 2022-07-28 DIAGNOSIS — M5441 Lumbago with sciatica, right side: Secondary | ICD-10-CM | POA: Diagnosis not present

## 2022-07-28 DIAGNOSIS — R69 Illness, unspecified: Secondary | ICD-10-CM | POA: Diagnosis not present

## 2022-07-28 DIAGNOSIS — Z6834 Body mass index (BMI) 34.0-34.9, adult: Secondary | ICD-10-CM | POA: Diagnosis not present

## 2022-07-28 DIAGNOSIS — G8929 Other chronic pain: Secondary | ICD-10-CM | POA: Diagnosis not present

## 2022-07-28 DIAGNOSIS — Z131 Encounter for screening for diabetes mellitus: Secondary | ICD-10-CM | POA: Diagnosis not present

## 2022-07-28 DIAGNOSIS — R5383 Other fatigue: Secondary | ICD-10-CM | POA: Diagnosis not present

## 2022-07-28 DIAGNOSIS — Z125 Encounter for screening for malignant neoplasm of prostate: Secondary | ICD-10-CM | POA: Diagnosis not present

## 2022-07-28 DIAGNOSIS — I10 Essential (primary) hypertension: Secondary | ICD-10-CM | POA: Diagnosis not present

## 2022-07-28 DIAGNOSIS — E559 Vitamin D deficiency, unspecified: Secondary | ICD-10-CM | POA: Diagnosis not present

## 2022-07-30 DIAGNOSIS — Z79899 Other long term (current) drug therapy: Secondary | ICD-10-CM | POA: Diagnosis not present

## 2022-08-14 DIAGNOSIS — G4733 Obstructive sleep apnea (adult) (pediatric): Secondary | ICD-10-CM | POA: Diagnosis not present

## 2022-08-28 DIAGNOSIS — R030 Elevated blood-pressure reading, without diagnosis of hypertension: Secondary | ICD-10-CM | POA: Diagnosis not present

## 2022-08-28 DIAGNOSIS — I10 Essential (primary) hypertension: Secondary | ICD-10-CM | POA: Diagnosis not present

## 2022-08-28 DIAGNOSIS — Z6833 Body mass index (BMI) 33.0-33.9, adult: Secondary | ICD-10-CM | POA: Diagnosis not present

## 2022-08-28 DIAGNOSIS — M5441 Lumbago with sciatica, right side: Secondary | ICD-10-CM | POA: Diagnosis not present

## 2022-08-28 DIAGNOSIS — G8929 Other chronic pain: Secondary | ICD-10-CM | POA: Diagnosis not present

## 2022-08-28 DIAGNOSIS — Z20828 Contact with and (suspected) exposure to other viral communicable diseases: Secondary | ICD-10-CM | POA: Diagnosis not present

## 2022-08-28 DIAGNOSIS — R69 Illness, unspecified: Secondary | ICD-10-CM | POA: Diagnosis not present

## 2022-08-28 DIAGNOSIS — F5101 Primary insomnia: Secondary | ICD-10-CM | POA: Diagnosis not present

## 2022-08-28 DIAGNOSIS — R7303 Prediabetes: Secondary | ICD-10-CM | POA: Diagnosis not present

## 2022-08-28 DIAGNOSIS — Z79899 Other long term (current) drug therapy: Secondary | ICD-10-CM | POA: Diagnosis not present

## 2022-08-28 DIAGNOSIS — Z23 Encounter for immunization: Secondary | ICD-10-CM | POA: Diagnosis not present

## 2022-08-28 DIAGNOSIS — M5442 Lumbago with sciatica, left side: Secondary | ICD-10-CM | POA: Diagnosis not present

## 2022-09-28 DIAGNOSIS — I10 Essential (primary) hypertension: Secondary | ICD-10-CM | POA: Diagnosis not present

## 2022-09-28 DIAGNOSIS — R7302 Impaired glucose tolerance (oral): Secondary | ICD-10-CM | POA: Diagnosis not present

## 2022-09-28 DIAGNOSIS — R69 Illness, unspecified: Secondary | ICD-10-CM | POA: Diagnosis not present

## 2022-09-28 DIAGNOSIS — M5441 Lumbago with sciatica, right side: Secondary | ICD-10-CM | POA: Diagnosis not present

## 2022-09-28 DIAGNOSIS — G8929 Other chronic pain: Secondary | ICD-10-CM | POA: Diagnosis not present

## 2022-09-28 DIAGNOSIS — R030 Elevated blood-pressure reading, without diagnosis of hypertension: Secondary | ICD-10-CM | POA: Diagnosis not present

## 2022-09-28 DIAGNOSIS — Z6833 Body mass index (BMI) 33.0-33.9, adult: Secondary | ICD-10-CM | POA: Diagnosis not present

## 2022-09-28 DIAGNOSIS — M5442 Lumbago with sciatica, left side: Secondary | ICD-10-CM | POA: Diagnosis not present

## 2023-05-06 DIAGNOSIS — Z20822 Contact with and (suspected) exposure to covid-19: Secondary | ICD-10-CM | POA: Diagnosis not present

## 2023-05-06 DIAGNOSIS — K148 Other diseases of tongue: Secondary | ICD-10-CM | POA: Diagnosis not present

## 2023-05-09 DIAGNOSIS — Z20822 Contact with and (suspected) exposure to covid-19: Secondary | ICD-10-CM | POA: Diagnosis not present

## 2023-05-09 DIAGNOSIS — G8929 Other chronic pain: Secondary | ICD-10-CM | POA: Diagnosis not present

## 2023-05-09 DIAGNOSIS — M5441 Lumbago with sciatica, right side: Secondary | ICD-10-CM | POA: Diagnosis not present

## 2023-05-09 DIAGNOSIS — I10 Essential (primary) hypertension: Secondary | ICD-10-CM | POA: Diagnosis not present

## 2023-05-09 DIAGNOSIS — M5442 Lumbago with sciatica, left side: Secondary | ICD-10-CM | POA: Diagnosis not present

## 2023-05-09 DIAGNOSIS — F5101 Primary insomnia: Secondary | ICD-10-CM | POA: Diagnosis not present

## 2023-07-21 DIAGNOSIS — D101 Benign neoplasm of tongue: Secondary | ICD-10-CM | POA: Diagnosis not present

## 2023-09-20 DIAGNOSIS — Z131 Encounter for screening for diabetes mellitus: Secondary | ICD-10-CM | POA: Diagnosis not present

## 2023-09-20 DIAGNOSIS — Z79899 Other long term (current) drug therapy: Secondary | ICD-10-CM | POA: Diagnosis not present

## 2023-09-20 DIAGNOSIS — Z113 Encounter for screening for infections with a predominantly sexual mode of transmission: Secondary | ICD-10-CM | POA: Diagnosis not present

## 2023-09-20 DIAGNOSIS — E7800 Pure hypercholesterolemia, unspecified: Secondary | ICD-10-CM | POA: Diagnosis not present

## 2023-09-20 DIAGNOSIS — Z125 Encounter for screening for malignant neoplasm of prostate: Secondary | ICD-10-CM | POA: Diagnosis not present

## 2023-12-20 DIAGNOSIS — Z122 Encounter for screening for malignant neoplasm of respiratory organs: Secondary | ICD-10-CM | POA: Diagnosis not present

## 2023-12-20 DIAGNOSIS — M5441 Lumbago with sciatica, right side: Secondary | ICD-10-CM | POA: Diagnosis not present

## 2023-12-20 DIAGNOSIS — R0602 Shortness of breath: Secondary | ICD-10-CM | POA: Diagnosis not present

## 2023-12-20 DIAGNOSIS — G8929 Other chronic pain: Secondary | ICD-10-CM | POA: Diagnosis not present

## 2023-12-20 DIAGNOSIS — M5442 Lumbago with sciatica, left side: Secondary | ICD-10-CM | POA: Diagnosis not present

## 2023-12-20 DIAGNOSIS — F17210 Nicotine dependence, cigarettes, uncomplicated: Secondary | ICD-10-CM | POA: Diagnosis not present

## 2023-12-28 DIAGNOSIS — G4733 Obstructive sleep apnea (adult) (pediatric): Secondary | ICD-10-CM | POA: Diagnosis not present

## 2023-12-28 DIAGNOSIS — R0602 Shortness of breath: Secondary | ICD-10-CM | POA: Diagnosis not present

## 2023-12-28 DIAGNOSIS — F17210 Nicotine dependence, cigarettes, uncomplicated: Secondary | ICD-10-CM | POA: Diagnosis not present

## 2024-01-03 DIAGNOSIS — G8929 Other chronic pain: Secondary | ICD-10-CM | POA: Diagnosis not present

## 2024-01-03 DIAGNOSIS — M5441 Lumbago with sciatica, right side: Secondary | ICD-10-CM | POA: Diagnosis not present

## 2024-01-03 DIAGNOSIS — Z79899 Other long term (current) drug therapy: Secondary | ICD-10-CM | POA: Diagnosis not present

## 2024-01-03 DIAGNOSIS — M5442 Lumbago with sciatica, left side: Secondary | ICD-10-CM | POA: Diagnosis not present

## 2024-01-03 DIAGNOSIS — I10 Essential (primary) hypertension: Secondary | ICD-10-CM | POA: Diagnosis not present

## 2024-01-03 DIAGNOSIS — F5101 Primary insomnia: Secondary | ICD-10-CM | POA: Diagnosis not present

## 2024-02-14 DIAGNOSIS — Z87891 Personal history of nicotine dependence: Secondary | ICD-10-CM | POA: Diagnosis not present

## 2024-02-29 DIAGNOSIS — G4733 Obstructive sleep apnea (adult) (pediatric): Secondary | ICD-10-CM | POA: Diagnosis not present

## 2024-02-29 DIAGNOSIS — F17210 Nicotine dependence, cigarettes, uncomplicated: Secondary | ICD-10-CM | POA: Diagnosis not present

## 2024-03-21 DIAGNOSIS — I10 Essential (primary) hypertension: Secondary | ICD-10-CM | POA: Diagnosis not present

## 2024-03-21 DIAGNOSIS — Z125 Encounter for screening for malignant neoplasm of prostate: Secondary | ICD-10-CM | POA: Diagnosis not present

## 2024-03-21 DIAGNOSIS — F5101 Primary insomnia: Secondary | ICD-10-CM | POA: Diagnosis not present

## 2024-03-21 DIAGNOSIS — Z131 Encounter for screening for diabetes mellitus: Secondary | ICD-10-CM | POA: Diagnosis not present

## 2024-03-21 DIAGNOSIS — Z1159 Encounter for screening for other viral diseases: Secondary | ICD-10-CM | POA: Diagnosis not present

## 2024-03-21 DIAGNOSIS — R7302 Impaired glucose tolerance (oral): Secondary | ICD-10-CM | POA: Diagnosis not present

## 2024-03-21 DIAGNOSIS — N5089 Other specified disorders of the male genital organs: Secondary | ICD-10-CM | POA: Diagnosis not present

## 2024-08-23 DIAGNOSIS — M5441 Lumbago with sciatica, right side: Secondary | ICD-10-CM | POA: Diagnosis not present

## 2024-09-27 NOTE — Progress Notes (Signed)
 Kris M Rotondo Jr.                                          MRN: [id number]   09/27/2024   The [facility] Quality Team Specialist reviewed this patient medical record for the purposes of chart review for care gap closure. The following were reviewed: chart review for care gap closure-controlling blood pressure.    [facility]BCI Quality Team
